# Patient Record
Sex: Female | Born: 1940 | Race: White | Hispanic: No | Marital: Married | Smoking: Former smoker
Health system: Southern US, Community
[De-identification: ages and names within clinical notes are randomized; demographics above are authoritative.]

## PROBLEM LIST (undated history)

## (undated) DIAGNOSIS — I499 Cardiac arrhythmia, unspecified: Secondary | ICD-10-CM

## (undated) DIAGNOSIS — I422 Other hypertrophic cardiomyopathy: Secondary | ICD-10-CM

## (undated) DIAGNOSIS — Z7901 Long term (current) use of anticoagulants: Secondary | ICD-10-CM

## (undated) DIAGNOSIS — N809 Endometriosis, unspecified: Secondary | ICD-10-CM

## (undated) DIAGNOSIS — K589 Irritable bowel syndrome without diarrhea: Secondary | ICD-10-CM

## (undated) DIAGNOSIS — S2239XA Fracture of one rib, unspecified side, initial encounter for closed fracture: Secondary | ICD-10-CM

## (undated) DIAGNOSIS — H01009 Unspecified blepharitis unspecified eye, unspecified eyelid: Secondary | ICD-10-CM

## (undated) DIAGNOSIS — Z9889 Other specified postprocedural states: Secondary | ICD-10-CM

## (undated) DIAGNOSIS — R519 Headache, unspecified: Secondary | ICD-10-CM

## (undated) DIAGNOSIS — I340 Nonrheumatic mitral (valve) insufficiency: Secondary | ICD-10-CM

## (undated) DIAGNOSIS — B009 Herpesviral infection, unspecified: Secondary | ICD-10-CM

## (undated) DIAGNOSIS — R51 Headache: Secondary | ICD-10-CM

## (undated) DIAGNOSIS — Z950 Presence of cardiac pacemaker: Secondary | ICD-10-CM

## (undated) DIAGNOSIS — I4891 Unspecified atrial fibrillation: Secondary | ICD-10-CM

## (undated) DIAGNOSIS — I517 Cardiomegaly: Secondary | ICD-10-CM

## (undated) HISTORY — DX: Headache: R51

## (undated) HISTORY — DX: Other specified postprocedural states: Z98.890

## (undated) HISTORY — DX: Fracture of one rib, unspecified side, initial encounter for closed fracture: S22.39XA

## (undated) HISTORY — DX: Other hypertrophic cardiomyopathy: I42.2

## (undated) HISTORY — DX: Cardiomegaly: I51.7

## (undated) HISTORY — PX: MITRAL VALVE REPAIR: SHX2039

## (undated) HISTORY — DX: Headache, unspecified: R51.9

## (undated) HISTORY — DX: Cardiac arrhythmia, unspecified: I49.9

## (undated) HISTORY — DX: Herpesviral infection, unspecified: B00.9

## (undated) HISTORY — DX: Long term (current) use of anticoagulants: Z79.01

## (undated) HISTORY — DX: Irritable bowel syndrome without diarrhea: K58.9

## (undated) HISTORY — DX: Nonrheumatic mitral (valve) insufficiency: I34.0

## (undated) HISTORY — DX: Endometriosis, unspecified: N80.9

## (undated) HISTORY — DX: Presence of cardiac pacemaker: Z95.0

## (undated) HISTORY — DX: Unspecified blepharitis unspecified eye, unspecified eyelid: H01.009

## (undated) HISTORY — DX: Unspecified atrial fibrillation: I48.91

## (undated) HISTORY — PX: MYOMECTOMY: SHX85

---
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
## 1981-09-15 HISTORY — PX: TUBAL LIGATION: SHX77

## 1999-10-17 ENCOUNTER — Other Ambulatory Visit: Admission: RE | Admit: 1999-10-17 | Discharge: 1999-10-17 | Payer: Self-pay | Admitting: Obstetrics and Gynecology

## 2000-11-24 ENCOUNTER — Other Ambulatory Visit: Admission: RE | Admit: 2000-11-24 | Discharge: 2000-11-24 | Payer: Self-pay | Admitting: Obstetrics and Gynecology

## 2002-04-21 ENCOUNTER — Encounter: Payer: Self-pay | Admitting: Internal Medicine

## 2002-04-21 ENCOUNTER — Encounter: Admission: RE | Admit: 2002-04-21 | Discharge: 2002-04-21 | Payer: Self-pay | Admitting: Internal Medicine

## 2002-06-15 HISTORY — PX: HYSTEROSCOPY: SHX211

## 2002-06-28 ENCOUNTER — Ambulatory Visit (HOSPITAL_COMMUNITY): Admission: RE | Admit: 2002-06-28 | Discharge: 2002-06-28 | Payer: Self-pay | Admitting: Obstetrics and Gynecology

## 2002-06-28 ENCOUNTER — Encounter (INDEPENDENT_AMBULATORY_CARE_PROVIDER_SITE_OTHER): Payer: Self-pay | Admitting: Specialist

## 2002-10-07 ENCOUNTER — Other Ambulatory Visit: Admission: RE | Admit: 2002-10-07 | Discharge: 2002-10-07 | Payer: Self-pay | Admitting: *Deleted

## 2002-12-15 DIAGNOSIS — K589 Irritable bowel syndrome without diarrhea: Secondary | ICD-10-CM

## 2002-12-15 HISTORY — DX: Irritable bowel syndrome, unspecified: K58.9

## 2003-01-31 ENCOUNTER — Ambulatory Visit (HOSPITAL_COMMUNITY): Admission: RE | Admit: 2003-01-31 | Discharge: 2003-01-31 | Payer: Self-pay | Admitting: Gastroenterology

## 2003-01-31 ENCOUNTER — Encounter (INDEPENDENT_AMBULATORY_CARE_PROVIDER_SITE_OTHER): Payer: Self-pay | Admitting: *Deleted

## 2003-04-18 ENCOUNTER — Encounter: Payer: Self-pay | Admitting: Gastroenterology

## 2003-04-18 ENCOUNTER — Encounter: Admission: RE | Admit: 2003-04-18 | Discharge: 2003-04-18 | Payer: Self-pay | Admitting: Gastroenterology

## 2003-10-12 ENCOUNTER — Other Ambulatory Visit: Admission: RE | Admit: 2003-10-12 | Discharge: 2003-10-12 | Payer: Self-pay | Admitting: *Deleted

## 2004-10-24 ENCOUNTER — Other Ambulatory Visit: Admission: RE | Admit: 2004-10-24 | Discharge: 2004-10-24 | Payer: Self-pay | Admitting: *Deleted

## 2005-07-31 ENCOUNTER — Encounter: Admission: RE | Admit: 2005-07-31 | Discharge: 2005-07-31 | Payer: Self-pay | Admitting: Internal Medicine

## 2005-11-12 ENCOUNTER — Other Ambulatory Visit: Admission: RE | Admit: 2005-11-12 | Discharge: 2005-11-12 | Payer: Self-pay | Admitting: Obstetrics and Gynecology

## 2007-01-22 ENCOUNTER — Other Ambulatory Visit: Admission: RE | Admit: 2007-01-22 | Discharge: 2007-01-22 | Payer: Self-pay | Admitting: Obstetrics and Gynecology

## 2007-03-08 ENCOUNTER — Ambulatory Visit: Payer: Self-pay | Admitting: Cardiology

## 2007-04-05 ENCOUNTER — Encounter: Payer: Self-pay | Admitting: Cardiology

## 2007-04-05 ENCOUNTER — Ambulatory Visit: Payer: Self-pay | Admitting: Cardiology

## 2007-04-05 ENCOUNTER — Ambulatory Visit: Payer: Self-pay

## 2007-04-05 LAB — CONVERTED CEMR LAB
AST: 28 units/L (ref 0–37)
Albumin: 4.3 g/dL (ref 3.5–5.2)
Alkaline Phosphatase: 52 units/L (ref 39–117)
Direct LDL: 135.1 mg/dL
HDL: 71 mg/dL (ref >39.0)
VLDL: 26 mg/dL (ref 0–40)

## 2007-04-07 ENCOUNTER — Ambulatory Visit: Payer: Self-pay | Admitting: Cardiology

## 2007-04-09 ENCOUNTER — Ambulatory Visit: Payer: Self-pay

## 2007-05-01 ENCOUNTER — Encounter: Admission: RE | Admit: 2007-05-01 | Discharge: 2007-05-01 | Payer: Self-pay | Admitting: Gastroenterology

## 2007-05-14 ENCOUNTER — Ambulatory Visit: Payer: Self-pay | Admitting: Cardiology

## 2007-08-10 ENCOUNTER — Ambulatory Visit: Payer: Self-pay | Admitting: Cardiology

## 2007-08-10 LAB — CONVERTED CEMR LAB
Total CHOL/HDL Ratio: 2.7
Triglycerides: 121 mg/dL (ref 0–149)

## 2007-08-19 ENCOUNTER — Ambulatory Visit: Payer: Self-pay | Admitting: Cardiology

## 2007-12-14 ENCOUNTER — Encounter: Payer: Self-pay | Admitting: Cardiology

## 2007-12-14 ENCOUNTER — Ambulatory Visit: Payer: Self-pay

## 2007-12-28 ENCOUNTER — Ambulatory Visit: Payer: Self-pay | Admitting: Cardiology

## 2008-02-01 ENCOUNTER — Other Ambulatory Visit: Admission: RE | Admit: 2008-02-01 | Discharge: 2008-02-01 | Payer: Self-pay | Admitting: Obstetrics and Gynecology

## 2008-02-02 ENCOUNTER — Encounter: Payer: Self-pay | Admitting: Cardiovascular Disease

## 2008-02-02 ENCOUNTER — Ambulatory Visit: Payer: Self-pay | Admitting: Cardiovascular Disease

## 2008-02-02 ENCOUNTER — Ambulatory Visit (HOSPITAL_COMMUNITY): Admission: RE | Admit: 2008-02-02 | Discharge: 2008-02-02 | Payer: Self-pay | Admitting: Cardiovascular Disease

## 2008-02-03 ENCOUNTER — Ambulatory Visit: Payer: Self-pay | Admitting: Cardiology

## 2008-02-09 ENCOUNTER — Ambulatory Visit: Payer: Self-pay | Admitting: Thoracic Surgery (Cardiothoracic Vascular Surgery)

## 2008-02-14 DIAGNOSIS — Z950 Presence of cardiac pacemaker: Secondary | ICD-10-CM

## 2008-02-14 HISTORY — DX: Presence of cardiac pacemaker: Z95.0

## 2008-02-14 HISTORY — PX: PACEMAKER INSERTION: SHX728

## 2008-02-15 ENCOUNTER — Ambulatory Visit: Payer: Self-pay | Admitting: Cardiology

## 2008-02-15 LAB — CONVERTED CEMR LAB
Basophils Absolute: 0.1 10*3/uL (ref 0.0–0.1)
Calcium: 9.6 mg/dL (ref 8.4–10.5)
GFR calc Af Amer: 81 mL/min
GFR calc non Af Amer: 67 mL/min
HCT: 40.8 % (ref 36.0–46.0)
Hemoglobin: 13.9 g/dL (ref 12.0–15.0)
Lymphocytes Relative: 22.6 % (ref 12.0–46.0)
MCHC: 33.9 g/dL (ref 30.0–36.0)
Monocytes Absolute: 0.5 10*3/uL (ref 0.1–1.0)
Neutro Abs: 5.6 10*3/uL (ref 1.4–7.7)
Prothrombin Time: 12.7 s (ref 10.9–13.3)
RDW: 12.5 % (ref 11.5–14.6)
Sodium: 141 meq/L (ref 135–145)
aPTT: 31.9 s — ABNORMAL HIGH (ref 21.7–29.8)

## 2008-02-17 ENCOUNTER — Inpatient Hospital Stay (HOSPITAL_BASED_OUTPATIENT_CLINIC_OR_DEPARTMENT_OTHER): Admission: RE | Admit: 2008-02-17 | Discharge: 2008-02-17 | Payer: Self-pay | Admitting: Cardiology

## 2008-02-17 ENCOUNTER — Ambulatory Visit: Payer: Self-pay | Admitting: Cardiology

## 2008-02-21 ENCOUNTER — Ambulatory Visit (HOSPITAL_COMMUNITY)
Admission: RE | Admit: 2008-02-21 | Discharge: 2008-02-21 | Payer: Self-pay | Admitting: Thoracic Surgery (Cardiothoracic Vascular Surgery)

## 2008-02-21 ENCOUNTER — Ambulatory Visit: Payer: Self-pay | Admitting: Thoracic Surgery (Cardiothoracic Vascular Surgery)

## 2008-02-21 ENCOUNTER — Encounter: Payer: Self-pay | Admitting: Thoracic Surgery (Cardiothoracic Vascular Surgery)

## 2008-02-21 ENCOUNTER — Ambulatory Visit: Payer: Self-pay | Admitting: Surgery

## 2008-02-23 ENCOUNTER — Ambulatory Visit: Payer: Self-pay | Admitting: Internal Medicine

## 2008-02-23 ENCOUNTER — Inpatient Hospital Stay (HOSPITAL_COMMUNITY)
Admission: RE | Admit: 2008-02-23 | Discharge: 2008-03-03 | Payer: Self-pay | Admitting: Thoracic Surgery (Cardiothoracic Vascular Surgery)

## 2008-02-23 ENCOUNTER — Encounter: Payer: Self-pay | Admitting: Thoracic Surgery (Cardiothoracic Vascular Surgery)

## 2008-03-07 ENCOUNTER — Ambulatory Visit: Payer: Self-pay | Admitting: Cardiovascular Disease

## 2008-03-07 ENCOUNTER — Ambulatory Visit: Payer: Self-pay | Admitting: Thoracic Surgery (Cardiothoracic Vascular Surgery)

## 2008-03-10 ENCOUNTER — Ambulatory Visit: Payer: Self-pay | Admitting: Cardiology

## 2008-03-13 ENCOUNTER — Encounter
Admission: RE | Admit: 2008-03-13 | Discharge: 2008-03-13 | Payer: Self-pay | Admitting: Thoracic Surgery (Cardiothoracic Vascular Surgery)

## 2008-03-13 ENCOUNTER — Ambulatory Visit: Payer: Self-pay | Admitting: Thoracic Surgery (Cardiothoracic Vascular Surgery)

## 2008-03-14 ENCOUNTER — Ambulatory Visit: Payer: Self-pay | Admitting: Cardiology

## 2008-03-15 ENCOUNTER — Ambulatory Visit: Payer: Self-pay | Admitting: Internal Medicine

## 2008-03-16 ENCOUNTER — Encounter (HOSPITAL_COMMUNITY): Admission: RE | Admit: 2008-03-16 | Discharge: 2008-06-08 | Payer: Self-pay | Admitting: Cardiology

## 2008-03-20 ENCOUNTER — Ambulatory Visit: Payer: Self-pay

## 2008-03-20 ENCOUNTER — Ambulatory Visit: Payer: Self-pay | Admitting: Internal Medicine

## 2008-03-29 ENCOUNTER — Ambulatory Visit: Payer: Self-pay | Admitting: Cardiology

## 2008-04-12 ENCOUNTER — Ambulatory Visit: Payer: Self-pay | Admitting: Cardiology

## 2008-04-21 ENCOUNTER — Ambulatory Visit: Payer: Self-pay | Admitting: Cardiology

## 2008-05-05 ENCOUNTER — Ambulatory Visit: Payer: Self-pay | Admitting: Internal Medicine

## 2008-05-08 ENCOUNTER — Ambulatory Visit: Payer: Self-pay | Admitting: Cardiology

## 2008-06-02 ENCOUNTER — Ambulatory Visit: Payer: Self-pay | Admitting: Cardiology

## 2008-06-05 ENCOUNTER — Ambulatory Visit: Payer: Self-pay | Admitting: Thoracic Surgery (Cardiothoracic Vascular Surgery)

## 2008-06-13 ENCOUNTER — Ambulatory Visit: Payer: Self-pay | Admitting: Internal Medicine

## 2008-06-30 ENCOUNTER — Ambulatory Visit: Payer: Self-pay | Admitting: Cardiology

## 2008-07-17 ENCOUNTER — Ambulatory Visit: Payer: Self-pay | Admitting: Cardiology

## 2008-07-17 ENCOUNTER — Ambulatory Visit: Payer: Self-pay | Admitting: Cardiovascular Disease

## 2008-07-31 ENCOUNTER — Ambulatory Visit: Payer: Self-pay | Admitting: Cardiology

## 2008-08-09 ENCOUNTER — Ambulatory Visit: Payer: Self-pay | Admitting: Cardiovascular Disease

## 2008-08-25 ENCOUNTER — Ambulatory Visit: Payer: Self-pay | Admitting: Cardiovascular Disease

## 2008-09-18 ENCOUNTER — Ambulatory Visit: Payer: Self-pay | Admitting: Internal Medicine

## 2008-10-02 ENCOUNTER — Ambulatory Visit: Payer: Self-pay | Admitting: Cardiology

## 2008-10-23 ENCOUNTER — Ambulatory Visit: Payer: Self-pay | Admitting: Cardiology

## 2008-11-23 ENCOUNTER — Ambulatory Visit: Payer: Self-pay | Admitting: Cardiovascular Disease

## 2008-12-07 DIAGNOSIS — Z950 Presence of cardiac pacemaker: Secondary | ICD-10-CM

## 2008-12-07 DIAGNOSIS — I080 Rheumatic disorders of both mitral and aortic valves: Secondary | ICD-10-CM

## 2008-12-07 DIAGNOSIS — I4891 Unspecified atrial fibrillation: Secondary | ICD-10-CM

## 2008-12-07 DIAGNOSIS — I421 Obstructive hypertrophic cardiomyopathy: Secondary | ICD-10-CM | POA: Insufficient documentation

## 2008-12-08 ENCOUNTER — Encounter: Payer: Self-pay | Admitting: Cardiology

## 2008-12-08 ENCOUNTER — Ambulatory Visit: Payer: Self-pay

## 2008-12-18 ENCOUNTER — Ambulatory Visit: Payer: Self-pay

## 2008-12-18 ENCOUNTER — Encounter: Payer: Self-pay | Admitting: Cardiology

## 2008-12-18 ENCOUNTER — Ambulatory Visit: Payer: Self-pay | Admitting: Cardiology

## 2008-12-21 ENCOUNTER — Encounter: Payer: Self-pay | Admitting: Internal Medicine

## 2009-01-15 ENCOUNTER — Ambulatory Visit: Payer: Self-pay | Admitting: Cardiology

## 2009-02-08 ENCOUNTER — Telehealth: Payer: Self-pay | Admitting: Cardiology

## 2009-02-13 ENCOUNTER — Ambulatory Visit: Payer: Self-pay | Admitting: Cardiology

## 2009-02-13 LAB — CONVERTED CEMR LAB: Protime: 18

## 2009-02-14 ENCOUNTER — Encounter: Payer: Self-pay | Admitting: *Deleted

## 2009-03-05 ENCOUNTER — Ambulatory Visit: Payer: Self-pay | Admitting: Internal Medicine

## 2009-03-21 ENCOUNTER — Encounter: Payer: Self-pay | Admitting: *Deleted

## 2009-03-28 ENCOUNTER — Ambulatory Visit: Payer: Self-pay | Admitting: Internal Medicine

## 2009-04-18 ENCOUNTER — Ambulatory Visit: Payer: Self-pay | Admitting: Cardiology

## 2009-04-18 LAB — CONVERTED CEMR LAB: POC INR: 2

## 2009-05-09 ENCOUNTER — Ambulatory Visit: Payer: Self-pay | Admitting: Internal Medicine

## 2009-05-09 LAB — CONVERTED CEMR LAB: POC INR: 1.4

## 2009-05-18 ENCOUNTER — Ambulatory Visit: Payer: Self-pay | Admitting: Internal Medicine

## 2009-06-08 ENCOUNTER — Ambulatory Visit: Payer: Self-pay | Admitting: Internal Medicine

## 2009-06-08 LAB — CONVERTED CEMR LAB: POC INR: 2.2

## 2009-06-22 ENCOUNTER — Telehealth: Payer: Self-pay | Admitting: Internal Medicine

## 2009-07-09 ENCOUNTER — Ambulatory Visit: Payer: Self-pay | Admitting: Cardiovascular Disease

## 2009-07-09 LAB — CONVERTED CEMR LAB: POC INR: 2.6

## 2009-08-02 ENCOUNTER — Encounter: Payer: Self-pay | Admitting: Internal Medicine

## 2009-08-03 ENCOUNTER — Encounter: Payer: Self-pay | Admitting: Internal Medicine

## 2009-08-03 ENCOUNTER — Ambulatory Visit: Payer: Self-pay | Admitting: Cardiology

## 2009-08-24 ENCOUNTER — Ambulatory Visit: Payer: Self-pay | Admitting: Cardiology

## 2009-09-04 ENCOUNTER — Telehealth (INDEPENDENT_AMBULATORY_CARE_PROVIDER_SITE_OTHER): Payer: Self-pay | Admitting: *Deleted

## 2009-09-10 ENCOUNTER — Ambulatory Visit (HOSPITAL_COMMUNITY): Admission: RE | Admit: 2009-09-10 | Discharge: 2009-09-10 | Payer: Self-pay | Admitting: Obstetrics and Gynecology

## 2009-09-10 HISTORY — PX: HYSTEROSCOPY: SHX211

## 2009-09-19 ENCOUNTER — Ambulatory Visit: Payer: Self-pay | Admitting: Cardiovascular Disease

## 2009-09-19 LAB — CONVERTED CEMR LAB: POC INR: 2.6

## 2009-10-19 ENCOUNTER — Ambulatory Visit: Payer: Self-pay | Admitting: Internal Medicine

## 2009-11-01 ENCOUNTER — Ambulatory Visit: Payer: Self-pay | Admitting: Internal Medicine

## 2009-11-12 ENCOUNTER — Telehealth: Payer: Self-pay | Admitting: Internal Medicine

## 2009-11-16 ENCOUNTER — Ambulatory Visit: Payer: Self-pay | Admitting: Cardiology

## 2009-12-14 ENCOUNTER — Ambulatory Visit: Payer: Self-pay | Admitting: Internal Medicine

## 2009-12-14 LAB — CONVERTED CEMR LAB: POC INR: 4

## 2009-12-31 ENCOUNTER — Ambulatory Visit: Payer: Self-pay | Admitting: Cardiology

## 2009-12-31 LAB — CONVERTED CEMR LAB: POC INR: 4

## 2010-01-14 ENCOUNTER — Ambulatory Visit: Payer: Self-pay | Admitting: Cardiology

## 2010-01-31 ENCOUNTER — Ambulatory Visit: Payer: Self-pay | Admitting: Internal Medicine

## 2010-01-31 LAB — CONVERTED CEMR LAB: POC INR: 3.4

## 2010-02-04 ENCOUNTER — Ambulatory Visit: Payer: Self-pay | Admitting: Internal Medicine

## 2010-02-22 ENCOUNTER — Ambulatory Visit: Payer: Self-pay | Admitting: Cardiovascular Disease

## 2010-02-22 LAB — CONVERTED CEMR LAB: POC INR: 1.9

## 2010-03-19 ENCOUNTER — Ambulatory Visit: Payer: Self-pay | Admitting: Internal Medicine

## 2010-04-15 ENCOUNTER — Encounter (INDEPENDENT_AMBULATORY_CARE_PROVIDER_SITE_OTHER): Payer: Self-pay | Admitting: *Deleted

## 2010-04-16 ENCOUNTER — Ambulatory Visit: Payer: Self-pay | Admitting: Cardiology

## 2010-05-09 ENCOUNTER — Ambulatory Visit: Payer: Self-pay | Admitting: Internal Medicine

## 2010-05-13 ENCOUNTER — Ambulatory Visit: Payer: Self-pay | Admitting: Cardiology

## 2010-06-12 ENCOUNTER — Ambulatory Visit: Payer: Self-pay | Admitting: Internal Medicine

## 2010-06-12 LAB — CONVERTED CEMR LAB: POC INR: 2.3

## 2010-06-25 ENCOUNTER — Ambulatory Visit: Payer: Self-pay | Admitting: Internal Medicine

## 2010-07-08 ENCOUNTER — Telehealth: Payer: Self-pay | Admitting: Internal Medicine

## 2010-07-08 ENCOUNTER — Encounter: Payer: Self-pay | Admitting: Internal Medicine

## 2010-07-30 ENCOUNTER — Ambulatory Visit: Payer: Self-pay | Admitting: Internal Medicine

## 2010-08-02 LAB — CONVERTED CEMR LAB
Basophils Absolute: 0.1 10*3/uL (ref 0.0–0.1)
Basophils Relative: 0.8 % (ref 0.0–3.0)
HCT: 40.9 % (ref 36.0–46.0)
Hemoglobin: 14 g/dL (ref 12.0–15.0)
Lymphs Abs: 1.9 10*3/uL (ref 0.7–4.0)
Monocytes Relative: 5.4 % (ref 3.0–12.0)
Neutro Abs: 5 10*3/uL (ref 1.4–7.7)
RBC: 4.32 M/uL (ref 3.87–5.11)
RDW: 13.7 % (ref 11.5–14.6)

## 2010-08-15 ENCOUNTER — Ambulatory Visit: Payer: Self-pay | Admitting: Internal Medicine

## 2010-09-06 ENCOUNTER — Telehealth: Payer: Self-pay | Admitting: Internal Medicine

## 2010-09-17 ENCOUNTER — Telehealth: Payer: Self-pay | Admitting: Internal Medicine

## 2010-10-17 NOTE — Medication Information (Signed)
Summary: ROV/LB  Anticoagulant Therapy  Managed by: Cloyde Reams, RN, BSN Referring MD: Rollene Rotunda MD PCP: Elmore Guise, MD Supervising MD: Clifton James MD, Cristal Deer Indication 1: Atrial Fibrillation (ICD-427.31) Lab Used: [facility] Site: [facility] INR POC 2.6 INR RANGE 2 - 3  Dietary changes: no    Health status changes: no    Bleeding/hemorrhagic complications: yes       Details: Still having some vaginal bleeding, lessening from [facility] on 09/10/09.  Recent/future hospitalizations: no    Any changes in medication regimen? no    Recent/future dental: no  Any missed doses?: yes     Details: Off coumadin 5 days prior to [facility] on 09/10/09.    Is patient compliant with meds? yes       Allergies (verified): No Known Drug Allergies  Anticoagulation Management History:      The patient is taking warfarin and comes in today for a routine follow up visit.  Positive risk factors for bleeding include an age of 70 years or older.  The bleeding index is 'intermediate risk'.  Negative CHADS2 values include Age > 70 years old.  The start date was 02/29/2008.  Her last INR was 2.9.  Anticoagulation responsible provider: Clifton James MD, Cristal Deer.  INR POC: 2.6.  Cuvette Lot#: [id number].  Exp: 08/2011.    Anticoagulation Management Assessment/Plan:      The patient's current anticoagulation dose is Coumadin 2.5 mg tabs: use as directed per anticoagulation clinic  5mg  every day except tuesday and fridays wchic is 2.mg.  The target INR is 2.0-3.0.  The next INR is due 10/19/2009.  Anticoagulation instructions were given to patient.  Results were reviewed/authorized by Cloyde Reams, RN, BSN.  She was notified by Cloyde Reams RN.         Prior Anticoagulation Instructions: INR 2.9 CONTINUE TAKING 2 TABLETS EVERYDAY EXCEPT TAKE 1 TABLET ON TUESDAYS AND FRIDAYS.  HOLD COUMADIN STARTING 12/22 (5 DAYS BEFORE SURGERY).  WILL RECHECK INR 1 WEEK AFTER SURGERY.  Current Anticoagulation  Instructions: INR 2.6  Continue on same dosage 2 tablets daily except 1 tablet on Tuesdays and Fridays.   Recheck in 4 weeks.

## 2010-10-17 NOTE — Medication Information (Signed)
Summary: rov/tm  Anticoagulant Therapy  Managed by: Minerva Areola, RN Referring MD: Rollene Rotunda MD PCP: Elmore Guise, MD Supervising MD: Juanda Chance MD, Lilith Solana Indication 1: Atrial Fibrillation (ICD-427.31) Lab Used: [facility] Site: [facility] INR POC 3.8 INR RANGE 2 - 3  Dietary changes: yes       Details: possible decrease in greens  Health status changes: no    Bleeding/hemorrhagic complications: no    Recent/future hospitalizations: no    Any changes in medication regimen? yes       Details: taking valcyclovir; stopped dose today  Recent/future dental: no  Any missed doses?: no       Is patient compliant with meds? yes       Current Medications (verified): 1)  Xalatan 0.005 % Soln (Latanoprost) .... Once Daily 2)  Restasis 0.05 % Emul (Cyclosporine) .... Two Times A Day Each Eye 3)  Calcium Carbonate-Vitamin D 600-400 Mg-Unit  Tabs (Calcium Carbonate-Vitamin D) .Marland Kitchen.. 1 By Mouth Two Times A Day 4)  Vagifem 25 Mcg Tabs (Estradiol) .... Twice Weekly 5)  Sleep Meds Otc .... As Needed 6)  Metoprolol Tartrate 25 Mg Tabs (Metoprolol Tartrate) .... Take One Tablet By Mouth Twice A Day 7)  Valtrex 500 Mg Tabs (Valacyclovir Hcl) .... As Needed 8)  Imodium A-D 2 Mg Tabs (Loperamide Hcl) .... Prn 9)  Coumadin 2.5 Mg Tabs (Warfarin Sodium) .... Use As Directed Per Anticoagulation Clinic  5mg  Every Day Except Tuesday and Fridays Wchic Is 2.mg 10)  Vitamin D 400 Unit Tabs (Cholecalciferol) .... Take 1 Tab Weekly 11)  Fish Oil 1000 Mg Caps (Omega-3 Fatty Acids) .... Take 1 Capsule By Mouth Two Times A Day  Allergies (verified): No Known Drug Allergies  Anticoagulation Management History:      The patient is taking warfarin and comes in today for a routine follow up visit.  Positive risk factors for bleeding include an age of 70 years or older.  The bleeding index is 'intermediate risk'.  Negative CHADS2 values include Age > 25 years old.  The start date was 02/29/2008.  Her last  INR was 2.9.  Anticoagulation responsible provider: Juanda Chance MD, Smitty Cords.  INR POC: 3.8.  Cuvette Lot#: [id number].  Exp: 01/2011.    Anticoagulation Management Assessment/Plan:      The patient's current anticoagulation dose is Coumadin 2.5 mg tabs: use as directed per anticoagulation clinic  5mg  every day except tuesday and fridays wchic is 2.mg.  The target INR is 2.0-3.0.  The next INR is due 12/14/2009.  Anticoagulation instructions were given to patient.  Results were reviewed/authorized by Minerva Areola, RN.  She was notified by Minerva Areola, RN, BSN.         Prior Anticoagulation Instructions: INR 3.4 Skip today's dose then resume 5mg s everyday except 2.5mg s on Tuesdays and Fridays. Recheck in 4 weeks.   Current Anticoagulation Instructions: The patient is to continue with the same dose of coumadin.  This dosage includes: skip today's dose, then take only one tablet tomorrow.  Then resume normal dose of 2 tablets every day except 1 tablet on Tuesdays and Fridays.

## 2010-10-17 NOTE — Progress Notes (Signed)
Summary: refil request  Phone Note Refill Request Message from:  [facility] on [facility]  Refills Requested: Medication #1:  METOPROLOL TARTRATE 25 MG TABS Take one tablet by mouth twice a day  Medication #2:  PRADAXA 150 MG CAPS one twice a day  Method Requested: Telephone to Pharmacy Initial call taken by: Glynda Jaeger,  September 06, 2010 9:12 AM    Prescriptions: METOPROLOL TARTRATE 25 MG TABS (METOPROLOL TARTRATE) Take one tablet by mouth twice a day  #180 x 2   Entered by:   Caralee Ates CMA   Authorized by:   Nathen May, MD, [facility]   Signed by:   Caralee Ates CMA on 09/06/2010   Method used:   Electronically to        [facility]* (retail)             ,          Ph: [phone]       Fax: [phone]   RxID:   [id number]

## 2010-10-17 NOTE — Cardiovascular Report (Signed)
Summary: Office Visit   Office Visit   Imported By: Roderic Ovens 06/27/2010 14:37:54  _____________________________________________________________________  External Attachment:    Type:   Image     Comment:   External Document

## 2010-10-17 NOTE — Medication Information (Signed)
Summary: rov  Anticoagulant Therapy  Managed by: Lyna Poser, PharmD Referring MD: Rollene Rotunda MD PCP: Elmore Guise, MD Supervising MD: Ladona Ridgel MD,Gregg Indication 1: Atrial Fibrillation (ICD-427.31) Lab Used: [facility] Site: [facility] INR POC 2.3 INR RANGE 2 - 3  Dietary changes: yes       Details: ate less than usual due to being out of town last week  Health status changes: yes       Details: herpes outbreak. started valacyclovir. Been taking it for 3 days.  Bleeding/hemorrhagic complications: no    Recent/future hospitalizations: no    Any changes in medication regimen? no    Recent/future dental: no  Any missed doses?: no       Is patient compliant with meds? yes       Allergies: No Known Drug Allergies  Anticoagulation Management History:      The patient is taking warfarin and comes in today for a routine follow up visit.  Positive risk factors for bleeding include an age of 70 years or older.  The bleeding index is 'intermediate risk'.  Negative CHADS2 values include Age > 70 years old.  The start date was 02/29/2008.  Her last INR was 2.9.  Anticoagulation responsible provider: Ladona Ridgel MD,Gregg.  INR POC: 2.3.  Cuvette Lot#: [id number].  Exp: 07/2011.    Anticoagulation Management Assessment/Plan:      The patient's current anticoagulation dose is Coumadin 2.5 mg tabs: Take as directed by coumadin clinic..  The target INR is 2.0-3.0.  The next INR is due 07/08/2010.  Anticoagulation instructions were given to patient.  Results were reviewed/authorized by Lyna Poser, PharmD.         Prior Anticoagulation Instructions: INR: 2.1  Please continue Coumadin 1 tablet (2.5 mg) on Sundays, Tuesdays, Thursdays and 2 tablets the other days of the week.  Please come back to see [place] in 4 weeks for an INR check.   Current Anticoagulation Instructions: INR 2.3 Continue taking 1 tablet on sunday, tuesday, and thursday. And 2 tablets all other days. Recheck in 4 weeks.

## 2010-10-17 NOTE — Medication Information (Signed)
Summary: [name]  Anticoagulant Therapy  Managed by: Bethena Midget, RN, BSN Referring MD: Rollene Rotunda MD PCP: Elmore Guise, MD Supervising MD: Juanda Chance MD, Taura Lamarre Indication 1: Atrial Fibrillation (ICD-427.31) Lab Used: [facility] Site: [facility] INR POC 3.5 INR RANGE 2 - 3  Dietary changes: no    Health status changes: no    Bleeding/hemorrhagic complications: no    Recent/future hospitalizations: no    Any changes in medication regimen? yes       Details: Has used some Valtrex PRN for 5 doses.   Recent/future dental: no  Any missed doses?: no       Is patient compliant with meds? yes       Allergies: No Known Drug Allergies  Anticoagulation Management History:      The patient is taking warfarin and comes in today for a routine follow up visit.  Positive risk factors for bleeding include an age of 77 years or older.  The bleeding index is 'intermediate risk'.  Negative CHADS2 values include Age > 65 years old.  The start date was 02/29/2008.  Her last INR was 2.9.  Anticoagulation responsible provider: Juanda Chance MD, Smitty Cords.  INR POC: 3.5.  Cuvette Lot#: [id number].  Exp: 02/2011.    Anticoagulation Management Assessment/Plan:      The patient's current anticoagulation dose is Coumadin 2.5 mg tabs: use as directed per anticoagulation clinic  5mg  every day except tuesday and fridays wchic is 2.mg.  The target INR is 2.0-3.0.  The next INR is due 01/31/2010.  Anticoagulation instructions were given to patient.  Results were reviewed/authorized by Bethena Midget, RN, BSN.  She was notified by Bethena Midget, RN, BSN.         Prior Anticoagulation Instructions: INR 4.0  Skip today's dosage of coumadin then start taking 2 tablets daily except 1 tablet on Sundays, Tuesdays, and Fridays.  Recheck in 2 weeks.    Current Anticoagulation Instructions: INR 3.5 Skip todays dose then resume 5mg s daily except 2.5mg s on Tuesdays, Fridays, and Sundays. Recheck in 2 weeks.

## 2010-10-17 NOTE — Medication Information (Signed)
Summary: rov/tm  Anticoagulant Therapy  Managed by: Charolotte Eke, PharmD Referring MD: Rollene Rotunda MD PCP: Elmore Guise, MD Supervising MD: Ladona Ridgel MD, Sharlot Gowda Indication 1: Atrial Fibrillation (ICD-427.31) Lab Used: [facility] Site: [facility] INR POC 3.4 INR RANGE 2 - 3  Dietary changes: no    Health status changes: no    Bleeding/hemorrhagic complications: no    Recent/future hospitalizations: no    Any changes in medication regimen? no    Recent/future dental: no  Any missed doses?: no       Is patient compliant with meds? yes       Current Medications (verified): 1)  Xalatan 0.005 % Soln (Latanoprost) .... Once Daily 2)  Restasis 0.05 % Emul (Cyclosporine) .... Two Times A Day Each Eye 3)  Calcium Carbonate-Vitamin D 600-400 Mg-Unit  Tabs (Calcium Carbonate-Vitamin D) .Marland Kitchen.. 1 By Mouth Two Times A Day 4)  Vagifem 25 Mcg Tabs (Estradiol) .... Twice Weekly 5)  Sleep Meds Otc .... As Needed 6)  Metoprolol Tartrate 25 Mg Tabs (Metoprolol Tartrate) .... Take One Tablet By Mouth Twice A Day 7)  Valtrex 500 Mg Tabs (Valacyclovir Hcl) .... As Needed 8)  Imodium A-D 2 Mg Tabs (Loperamide Hcl) .... Prn 9)  Coumadin 2.5 Mg Tabs (Warfarin Sodium) .... Use As Directed Per Anticoagulation Clinic  5mg  Every Day Except Tuesday and Fridays Wchic Is 2.mg 10)  Vitamin D 400 Unit Tabs (Cholecalciferol) .... Take 1 Tab Weekly 11)  Fish Oil 1000 Mg Caps (Omega-3 Fatty Acids) .... Take 1 Capsule By Mouth Two Times A Day  Allergies (verified): No Known Drug Allergies  Anticoagulation Management History:      The patient is taking warfarin and comes in today for a routine follow up visit.  Positive risk factors for bleeding include an age of 60 years or older.  The bleeding index is 'intermediate risk'.  Negative CHADS2 values include Age > 76 years old.  The start date was 02/29/2008.  Her last INR was 2.9.  Anticoagulation responsible provider: Ladona Ridgel MD, Sharlot Gowda.  INR POC: 3.4.  Cuvette  Lot#: [id number].  Exp: 04/16/2011.    Anticoagulation Management Assessment/Plan:      The patient's current anticoagulation dose is Coumadin 2.5 mg tabs: use as directed per anticoagulation clinic  5mg  every day except tuesday and fridays wchic is 2.mg.  The target INR is 2.0-3.0.  The next INR is due 02/21/2010.  Anticoagulation instructions were given to patient.  Results were reviewed/authorized by Charolotte Eke, PharmD.  She was notified by Charolotte Eke, PharmD.         Prior Anticoagulation Instructions: INR 3.5 Skip todays dose then resume 5mg s daily except 2.5mg s on Tuesdays, Fridays, and Sundays. Recheck in 2 weeks.   Current Anticoagulation Instructions: Skip today then take 2.5mg  daily except 5mg  on Mon, Wed, Fri.The patient is to hold four doses of coumadin.  The dosage to be resumed includes:

## 2010-10-17 NOTE — Cardiovascular Report (Signed)
Summary: TTM   TTM   Imported By: Roderic Ovens 11/15/2009 16:13:19  _____________________________________________________________________  External Attachment:    Type:   Image     Comment:   External Document

## 2010-10-17 NOTE — Medication Information (Signed)
Summary: rov/tm  Anticoagulant Therapy  Managed by: Cloyde Reams, RN, BSN Referring MD: Rollene Rotunda MD PCP: Elmore Guise, MD Supervising MD: Juanda Chance MD, Aniqa Hare Indication 1: Atrial Fibrillation (ICD-427.31) Lab Used: [facility] Site: [facility] INR POC 4.0 INR RANGE 2 - 3  Dietary changes: no    Health status changes: no    Bleeding/hemorrhagic complications: no    Recent/future hospitalizations: no    Any changes in medication regimen? yes       Details: Taking Meclazine prn vertigo.  Recent/future dental: no  Any missed doses?: no       Is patient compliant with meds? yes       Allergies (verified): No Known Drug Allergies  Anticoagulation Management History:      The patient is taking warfarin and comes in today for a routine follow up visit.  Positive risk factors for bleeding include an age of 16 years or older.  The bleeding index is 'intermediate risk'.  Negative CHADS2 values include Age > 44 years old.  The start date was 02/29/2008.  Her last INR was 2.9.  Anticoagulation responsible provider: Juanda Chance MD, Smitty Cords.  INR POC: 4.0.  Cuvette Lot#: [id number].  Exp: 01/2011.    Anticoagulation Management Assessment/Plan:      The patient's current anticoagulation dose is Coumadin 2.5 mg tabs: use as directed per anticoagulation clinic  5mg  every day except tuesday and fridays wchic is 2.mg.  The target INR is 2.0-3.0.  The next INR is due 01/14/2010.  Anticoagulation instructions were given to patient.  Results were reviewed/authorized by Cloyde Reams, RN, BSN.  She was notified by Cloyde Reams RN.         Prior Anticoagulation Instructions: INR 4.0 Skip today's dose then resume 5mg s daily except 2.5mg s on Tuesdays and Fridays. Recheck in 2 weeks.   Current Anticoagulation Instructions: INR 4.0  Skip today's dosage of coumadin then start taking 2 tablets daily except 1 tablet on Sundays, Tuesdays, and Fridays.  Recheck in 2 weeks.

## 2010-10-17 NOTE — Medication Information (Signed)
Summary: cvrr rov    js  Anticoagulant Therapy  Managed by: Bethena Midget, RN, BSN Referring MD: Rollene Rotunda MD PCP: Elmore Guise, MD Supervising MD: Tenny Craw MD, Gunnar Fusi Indication 1: Atrial Fibrillation (ICD-427.31) Lab Used: [facility] Site: [facility] INR POC 4.0 INR RANGE 2 - 3  Dietary changes: yes       Details: Ate less green vegetables over past week.  Health status changes: no    Bleeding/hemorrhagic complications: no    Recent/future hospitalizations: no    Any changes in medication regimen? yes       Details: Took one tablet of Valtrex on yesterday.   Recent/future dental: no  Any missed doses?: no       Is patient compliant with meds? yes       Allergies: No Known Drug Allergies  Anticoagulation Management History:      The patient is taking warfarin and comes in today for a routine follow up visit.  Positive risk factors for bleeding include an age of 45 years or older.  The bleeding index is 'intermediate risk'.  Negative CHADS2 values include Age > 48 years old.  The start date was 02/29/2008.  Her last INR was 2.9.  Anticoagulation responsible provider: Tenny Craw MD, Gunnar Fusi.  INR POC: 4.0.  Cuvette Lot#: [id number].  Exp: 01/2011.    Anticoagulation Management Assessment/Plan:      The patient's current anticoagulation dose is Coumadin 2.5 mg tabs: use as directed per anticoagulation clinic  5mg  every day except tuesday and fridays wchic is 2.mg.  The target INR is 2.0-3.0.  The next INR is due 12/28/2009.  Anticoagulation instructions were given to patient.  Results were reviewed/authorized by Bethena Midget, RN, BSN.  She was notified by Bethena Midget, RN, BSN.         Prior Anticoagulation Instructions: The patient is to continue with the same dose of coumadin.  This dosage includes: skip today's dose, then take only one tablet tomorrow.  Then resume normal dose of 2 tablets every day except 1 tablet on Tuesdays and Fridays.  Current Anticoagulation Instructions: INR  4.0 Skip today's dose then resume 5mg s daily except 2.5mg s on Tuesdays and Fridays. Recheck in 2 weeks.

## 2010-10-17 NOTE — Medication Information (Signed)
Summary: rov/ewj  Anticoagulant Therapy  Managed by: Reina Fuse, PharmD Referring MD: Rollene Rotunda MD PCP: Elmore Guise, MD Supervising MD: Juanda Chance MD, Bruce Indication 1: Atrial Fibrillation (ICD-427.31) Lab Used: [facility] Site: [facility] INR POC 2.2 INR RANGE 2 - 3  Dietary changes: no    Health status changes: no    Bleeding/hemorrhagic complications: no    Recent/future hospitalizations: no    Any changes in medication regimen? no    Recent/future dental: no  Any missed doses?: no       Is patient compliant with meds? yes       Current Medications (verified): 1)  Xalatan 0.005 % Soln (Latanoprost) .... Once Daily 2)  Restasis 0.05 % Emul (Cyclosporine) .... Two Times A Day Each Eye 3)  Calcium Carbonate-Vitamin D 600-400 Mg-Unit  Tabs (Calcium Carbonate-Vitamin D) .Marland Kitchen.. 1 By Mouth Two Times A Day 4)  Vagifem 25 Mcg Tabs (Estradiol) .... Twice Weekly 5)  Sleep Meds Otc .... As Needed 6)  Metoprolol Tartrate 25 Mg Tabs (Metoprolol Tartrate) .... Take One Tablet By Mouth Twice A Day 7)  Valtrex 500 Mg Tabs (Valacyclovir Hcl) .... As Needed 8)  Imodium A-D 2 Mg Tabs (Loperamide Hcl) .... Prn 9)  Coumadin 2.5 Mg Tabs (Warfarin Sodium) .... Use As Directed Per Anticoagulation Clinic  5mg  Every Day Except Tuesday and Fridays Wchic Is 2.mg 10)  Vitamin D 400 Unit Tabs (Cholecalciferol) .... Take 1 Tab Weekly 11)  Fish Oil 1000 Mg Caps (Omega-3 Fatty Acids) .... Take 1 Capsule By Mouth Two Times A Day  Allergies (verified): No Known Drug Allergies  Anticoagulation Management History:      The patient is taking warfarin and comes in today for a routine follow up visit.  Positive risk factors for bleeding include an age of 70 years or older.  The bleeding index is 'intermediate risk'.  Negative CHADS2 values include Age > 74 years old.  The start date was 02/29/2008.  Her last INR was 2.9.  Anticoagulation responsible Keishaun Hazel: Juanda Chance MD, Smitty Cords.  INR POC: 2.2.  Cuvette Lot#:  [id number].  Exp: 06/2011.    Anticoagulation Management Assessment/Plan:      The patient's current anticoagulation dose is Coumadin 2.5 mg tabs: use as directed per anticoagulation clinic  5mg  every day except tuesday and fridays wchic is 2.mg.  The target INR is 2.0-3.0.  The next INR is due 05/13/2010.  Anticoagulation instructions were given to patient.  Results were reviewed/authorized by Reina Fuse, PharmD.  She was notified by Reina Fuse PharmD.         Prior Anticoagulation Instructions: INR 2.1  Continue on same dosage 2 tablets daily except 1 tablet on Sundays, Tuesdays, and Thursdays.  Recheck in 4 weeks.    Current Anticoagulation Instructions: INR 2.2  Continue Coumadin 2 tabs (5 mg) on Mon, Wed, Fri, Sat and Coumadin 1 tab on Sun, Tues, Thur (2.5 mg). Return to clinic in 4 weeks.

## 2010-10-17 NOTE — Progress Notes (Signed)
Summary: refill  Phone Note Refill Request Message from:  Patient on November 12, 2009 9:43 AM  Refills Requested: Medication #1:  METOPROLOL TARTRATE 25 MG TABS Take one tablet by mouth twice a day   Supply Requested: [facility] [address]   Method Requested: Fax to Local Pharmacy Initial call taken by: Migdalia Dk,  November 12, 2009 9:43 AM  Follow-up for Phone Call       Follow-up by: Judithe Modest CMA,  November 12, 2009 1:57 PM    Prescriptions: METOPROLOL TARTRATE 25 MG TABS (METOPROLOL TARTRATE) Take one tablet by mouth twice a day  #60 x 0   Entered by:   Judithe Modest CMA   Authorized by:   Nathen May, MD, [facility]   Signed by:   Judithe Modest CMA on 11/12/2009   Method used:   Electronically to        [id number] (retail)       [address]       [place], [place]  [address]       Ph: [phone] or [phone]       Fax: [phone]   RxID:   [id number]

## 2010-10-17 NOTE — Medication Information (Signed)
Summary: Coumadin Clinic  Anticoagulant Therapy  Managed by: Inactive Referring MD: Rollene Rotunda MD PCP: Elmore Guise, MD Supervising MD: Graciela Husbands MD, Viviann Spare Indication 1: Atrial Fibrillation (ICD-427.31) Lab Used: [facility] Site: [facility] INR RANGE 2 - 3          Comments: Pt now on Pradaxa  Allergies: No Known Drug Allergies  Anticoagulation Management History:      Positive risk factors for bleeding include an age of 70 years or older.  The bleeding index is 'intermediate risk'.  Negative CHADS2 values include Age > 70 years old.  The start date was 02/29/2008.  Her last INR was 2.9.  Anticoagulation responsible Tyniah Kastens: Graciela Husbands MD, Viviann Spare.  Exp: 07/2011.    Anticoagulation Management Assessment/Plan:      The patient's current anticoagulation dose is Coumadin 2.5 mg tabs: Take as directed by coumadin clinic..  The target INR is 2.0-3.0.  The next INR is due 07/08/2010.  Anticoagulation instructions were given to patient.  Results were reviewed/authorized by Inactive.         Prior Anticoagulation Instructions: INR 2.3 Continue taking 1 tablet on sunday, tuesday, and thursday. And 2 tablets all other days. Recheck in 4 weeks.

## 2010-10-17 NOTE — Letter (Signed)
Summary: Device-Delinquent Check  [facility], Main Office  [address]   [place], [place] [address]   Phone: [phone]  Fax: [phone]     April 15, 2010 MRN: [id number]   [facility] [address] [place], [place]  [address]   Dear Ms. [name],  According to our records, you have not had your implanted device checked in the recommended period of time.  We are unable to determine appropriate device function without checking your device on a regular basis.  Please call our office to schedule an appointment as soon as possible.  If you are having your device checked by another physician, please call us so that we may update our records.  Thank you,   [facility]

## 2010-10-17 NOTE — Medication Information (Signed)
Summary: rov/ewj  Anticoagulant Therapy  Managed by: Bethena Midget, RN, BSN Referring MD: Rollene Rotunda MD PCP: Elmore Guise, MD Supervising MD: Johney Frame MD, Fayrene Fearing Indication 1: Atrial Fibrillation (ICD-427.31) Lab Used: [facility] Site: [facility] INR POC 3.4 INR RANGE 2 - 3  Dietary changes: no    Health status changes: no    Bleeding/hemorrhagic complications: no    Recent/future hospitalizations: no    Any changes in medication regimen? yes       Details: Took Acyclovir BID for 4-5days.   Recent/future dental: no  Any missed doses?: no       Is patient compliant with meds? yes       Allergies: No Known Drug Allergies  Anticoagulation Management History:      The patient is taking warfarin and comes in today for a routine follow up visit.  Positive risk factors for bleeding include an age of 70 years or older.  The bleeding index is 'intermediate risk'.  Negative CHADS2 values include Age > 71 years old.  The start date was 02/29/2008.  Her last INR was 2.9.  Anticoagulation responsible provider: Gerren Hoffmeier MD, Fayrene Fearing.  INR POC: 3.4.  Cuvette Lot#: [id number].  Exp: 12/2010.    Anticoagulation Management Assessment/Plan:      The patient's current anticoagulation dose is Coumadin 2.5 mg tabs: use as directed per anticoagulation clinic  5mg  every day except tuesday and fridays wchic is 2.mg.  The target INR is 2.0-3.0.  The next INR is due 11/16/2009.  Anticoagulation instructions were given to patient.  Results were reviewed/authorized by Bethena Midget, RN, BSN.  She was notified by Bethena Midget, RN, BSN.         Prior Anticoagulation Instructions: INR 2.6  Continue on same dosage 2 tablets daily except 1 tablet on Tuesdays and Fridays.   Recheck in 4 weeks.    Current Anticoagulation Instructions: INR 3.4 Skip today's dose then resume 5mg s everyday except 2.5mg s on Tuesdays and Fridays. Recheck in 4 weeks.

## 2010-10-17 NOTE — Cardiovascular Report (Signed)
Summary: TTM   TTM   Imported By: Roderic Ovens 08/30/2010 15:10:45  _____________________________________________________________________  External Attachment:    Type:   Image     Comment:   External Document

## 2010-10-17 NOTE — Medication Information (Signed)
Summary: rov-tp  Anticoagulant Therapy  Managed by: Bethena Midget, RN, BSN Referring MD: Rollene Rotunda MD PCP: Elmore Guise, MD Supervising MD: Clifton James MD, Cristal Deer Indication 1: Atrial Fibrillation (ICD-427.31) Lab Used: [facility] Site: [facility] INR POC 1.9 INR RANGE 2 - 3  Dietary changes: yes       Details: Possible ate more leafy green veggies  Health status changes: no    Bleeding/hemorrhagic complications: no    Recent/future hospitalizations: no    Any changes in medication regimen? no    Recent/future dental: no  Any missed doses?: no       Is patient compliant with meds? yes       Allergies: No Known Drug Allergies  Anticoagulation Management History:      The patient is taking warfarin and comes in today for a routine follow up visit.  Positive risk factors for bleeding include an age of 47 years or older.  The bleeding index is 'intermediate risk'.  Negative CHADS2 values include Age > 63 years old.  The start date was 02/29/2008.  Her last INR was 2.9.  Anticoagulation responsible provider: Clifton James MD, Cristal Deer.  INR POC: 1.9.  Cuvette Lot#: [id number].  Exp: 04/16/2011.    Anticoagulation Management Assessment/Plan:      The patient's current anticoagulation dose is Coumadin 2.5 mg tabs: use as directed per anticoagulation clinic  5mg  every day except tuesday and fridays wchic is 2.mg.  The target INR is 2.0-3.0.  The next INR is due 03/15/2010.  Anticoagulation instructions were given to patient.  Results were reviewed/authorized by Bethena Midget, RN, BSN.  She was notified by Bethena Midget, RN, BSN.         Prior Anticoagulation Instructions: Skip today then take 2.5mg  daily except 5mg  on Mon, Wed, Fri.The patient is to hold four doses of coumadin.  The dosage to be resumed includes:   Current Anticoagulation Instructions: INR 1.9 Start taking 5mg s on Mondays, Wednesdays, Fridays and Saturdays, 2.5mg s on Tuesdays, Thursdays and Sundays. Recheck in 3  weeks.

## 2010-10-17 NOTE — Cardiovascular Report (Signed)
Summary: TTM   TTM   Imported By: Roderic Ovens 03/01/2010 09:21:22  _____________________________________________________________________  External Attachment:    Type:   Image     Comment:   External Document

## 2010-10-17 NOTE — Assessment & Plan Note (Signed)
Summary: pc2/ gd   Primary Provider:  Elmore Guise, MD  CC:  no complaints.  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 She has paroxysmal atrial fibrillation and is status post implanted pacemaker.  she has underlying hypertrophic myopathy with asymmetric septal hypertrophy  She denies positive exercise intolerance apart from climbing steep hills. She does aerobics 3 times a week and is without limitation in this regard.  Overall she feels terrific.  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 Once Daily 2)  Restasis 0.05 % Emul (Cyclosporine) .... Two Times A Day Each Eye 3)  Calcium Carbonate-Vitamin D 600-400 Mg-Unit  Tabs (Calcium Carbonate-Vitamin D) .Marland Kitchen.. 1 By Mouth Two Times A Day 4)  Vagifem 25 Mcg Tabs (Estradiol) .... Twice Weekly 5)  Sleep Meds Otc .... As Needed 6)  Metoprolol Tartrate 25 Mg Tabs (Metoprolol Tartrate) .... Take One Tablet By Mouth Twice A Day 7)  Valtrex 500 Mg Tabs (Valacyclovir Hcl) .... As Needed 8)  Imodium A-D 2 Mg Tabs (Loperamide Hcl) .... Prn 9)  Coumadin 2.5 Mg Tabs (Warfarin Sodium) .... Take As Directed By Coumadin Clinic. 10)  Vitamin D 400 Unit Tabs (Cholecalciferol) .... Take 1 Tab Weekly 11)  Fish Oil 1000 Mg Caps (Omega-3 Fatty Acids) .... 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 (02/23/08  Dr. Cornelius Moras) Appendectomy Tubal ligation  Family History: Last updated: 12/07/2008 Is remarkable for brother in his 30s with triple bypass   and another brother with 2 vessel coronary disease at age 51.  Her   father died at a later age with a myocardial infarction.   Social History: Last updated: 12/07/2008 The patient is married and lives here in [place]   with her husband.  They have 1 grown child.  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  Her follow is Dr. Otho Perl.  She is a nonsmoker, although she did smoke briefly during her   youth and quit smoking during her 46s.  She denies excessive alcohol   consumption.   Vital Signs:  Patient profile:   70 year old female Height:      60 inches Weight:      119 pounds BMI:     23.32 Pulse rate:   59 / minute Resp:     18 per minute BP sitting:   99 / 57  (left arm)  Vitals Entered By: Laurance Flatten CMA (June 25, 2010 1:19 PM)  Physical Exam  General:  The patient was alert and oriented in no acute distress. HEENT Normal.  Neck veins were flat, carotids were brisk.  Lungs were clear.  Heart sounds were regular without murmurs or gallops.  Abdomen was soft with active bowel sounds. There is no clubbing cyanosis or edema. Skin Warm and dry '   EKG  Procedure date:  06/25/2010  Findings:      sinus rhythm  PPM Specifications Following MD:  Sherryl Manges, MD     PPM Vendor:  St Jude     PPM Model Number:  [phone]     PPM Serial Number:  [id number] PPM DOI:  03/01/2008      Lead 1    Location: RA     DOI: 03/01/2008     Model #: 1688TC     Serial #: [id number]     Status: active Lead 2    Location: RV     DOI: 03/01/2008     Model #: 1688TC     Serial #: [id number]     Status: active   Indications:  HOCM   PPM Follow Up Battery Voltage:  2.79 V     Battery Est. Longevity:  9.50 yrs     Pacer Dependent:  No       PPM Device  Measurements Atrium  Amplitude: 5.0 mV, Impedance: 584 ohms, Threshold: 0.75 V at 0.4 msec Right Ventricle  Amplitude: 12.0 mV, Impedance: 308 ohms, Threshold: 0.875 V at 0.4 msec  Episodes MS Episodes:  109     Percent Mode Switch:  <1%     Coumadin:  No Ventricular High Rate:  0     Atrial Pacing:  >99%     Ventricular Pacing:  <1%  Parameters Mode:  DDDR     Lower Rate Limit:  60     Upper Rate Limit:  100 Paced AV Delay:  225     Sensed AV Delay:  225 Next Cardiology Appt Due:  12/16/2010 Tech Comments:  109 AMS EPISODES--LONGEST WAS 14 SECONDS. V RATES CONTROLLED DURING AMS.  NORMAL DEVICE FUNCTION.  NO CHANGES MADE. MEDNET TTM 3 MTHS. ROV IN 6 MTHS W/DEVICE CLINIC. Vella Kohler  June 25, 2010 1:13 PM  Impression & Recommendations:  Problem # 1:  FIBRILLATION, ATRIAL (ICD-427.31) the patient has atrial fibrillation but is holding sinus rhythm. Interrogation of her device demonstrates atrial fibrillation burden of less than 1% the longest episode which is 14 seconds.  We had a lengthy discussion regarding the role of oral anticoagulation including the risk and benefits of Pradaxa versus Coumadin. She would like to begin the former. This was prescribed Her updated medication list for this problem includes:    Metoprolol Tartrate 25 Mg Tabs (Metoprolol tartrate) .Marland Kitchen... Take one tablet by mouth twice a day    Coumadin 2.5 Mg Tabs (Warfarin sodium) .Marland Kitchen... Take as directed by coumadin clinic.  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 Take one tablet by mouth twice a day    Coumadin 2.5 Mg Tabs (Warfarin sodium) .Marland Kitchen... Take as directed by coumadin clinic.  Problem # 3:  PACEMAKER, PERMANENT (ICD-V45.01) Device parameters and data were reviewed and no changes were made  Problem # 4:  SINUS NODE DYSFUNCTION (ICD-427.81) she is 100% atrially paced.  Her heart rate excursion is somewhat limited although her peak sensory is 140. As she has no exercise limitations we will leave the programming as it is Her updated medication list for this problem includes:    Metoprolol Tartrate 25 Mg Tabs (Metoprolol tartrate) .Marland Kitchen... Take one tablet by mouth twice a day    Coumadin 2.5 Mg Tabs (Warfarin sodium) .Marland Kitchen... Take as directed by coumadin clinic.  Patient Instructions: 1)  Your physician recommends that you  schedule a follow-up appointment in: 2 to 3 months with Dr Graciela Husbands 2)  Your physician has recommended you make the following change in your medication: start Pradaxa 150 mg twice daily after 2 days 3)  Your physician recommends that you return for lab work in:  1 month CBC 427.31 v58.69 Prescriptions: PRADAXA 150 MG CAPS (DABIGATRAN ETEXILATE MESYLATE) one twice a day  #180 x 3   Entered by:   Jennifer Gladden RN   Authorized by:   Nathen May, MD, [facility]   Signed by:   Jennifer Gladden RN on 06/25/2010   Method used:   Electronically to        [facility] ([facility]       [address]       [place], [place]  [address]       Ph: [phone] or [phone]       Fax: [phone]   RxID:   [id number] PRADAXA 150 MG CAPS (DABIGATRAN ETEXILATE MESYLATE) one twice a day  #60 x 1   Entered by:   Jennifer Gladden RN   Authorized by:   Nathen May, MD, [facility]   Signed by:   Jennifer Gladden RN on 06/25/2010   Method used:   Electronically to        [facility] ([facility]ail[facility]       [address]       [place], [place]  [address]       Ph: [phone] or [phone]       Fax: [phone]   RxID:   [id number]

## 2010-10-17 NOTE — Medication Information (Signed)
Summary: rov/tp  Anticoagulant Therapy  Managed by: Geoffry Paradise, PharmD Referring MD: Rollene Rotunda MD PCP: Elmore Guise, MD Supervising MD: Riley Kill MD, Maisie Fus Indication 1: Atrial Fibrillation (ICD-427.31) Lab Used: [facility] Site: [facility] INR POC 2.1 INR RANGE 2 - 3  Dietary changes: no    Health status changes: no    Bleeding/hemorrhagic complications: no    Recent/future hospitalizations: no    Any changes in medication regimen? no    Recent/future dental: no  Any missed doses?: no       Is patient compliant with meds? yes       Allergies: No Known Drug Allergies  Anticoagulation Management History:      Positive risk factors for bleeding include an age of 4 years or older.  The bleeding index is 'intermediate risk'.  Negative CHADS2 values include Age > 85 years old.  The start date was 02/29/2008.  Her last INR was 2.9.  Anticoagulation responsible provider: Riley Kill MD, Maisie Fus.  INR POC: 2.1.  Cuvette Lot#: [id number].  Exp: 07/2011.    Anticoagulation Management Assessment/Plan:      The patient's current anticoagulation dose is Coumadin 2.5 mg tabs: Take as directed by coumadin clinic..  The target INR is 2.0-3.0.  The next INR is due 06/10/2010.  Anticoagulation instructions were given to patient.  Results were reviewed/authorized by Geoffry Paradise, PharmD.         Prior Anticoagulation Instructions: INR 2.2  Continue Coumadin 2 tabs (5 mg) on Mon, Wed, Fri, Sat and Coumadin 1 tab on Sun, Tues, Thur (2.5 mg). Return to clinic in 4 weeks.    Current Anticoagulation Instructions: INR: 2.1  Please continue Coumadin 1 tablet (2.5 mg) on Sundays, Tuesdays, Thursdays and 2 tablets the other days of the week.  Please come back to see [place] in 4 weeks for an INR check.

## 2010-10-17 NOTE — Progress Notes (Signed)
Summary: PRADAXA  Phone Note Refill Request Message from:  Patient on September 17, 2010 9:44 AM  Refills Requested: Medication #1:  PRADAXA 150 MG CAPS one twice a day [phone]  Initial call taken by: Judie Grieve,  September 17, 2010 9:46 AM    Prescriptions: PRADAXA 150 MG CAPS (DABIGATRAN ETEXILATE MESYLATE) one twice a day  #60 x 1   Entered by:   Caralee Ates CMA   Authorized by:   Nathen May, MD, [facility]   Signed by:   Caralee Ates CMA on 09/17/2010   Method used:   Electronically to        [facility]* (retail)             ,          Ph: [phone]       Fax: [phone]   RxID:   [id number]

## 2010-10-17 NOTE — Cardiovascular Report (Signed)
Summary: TTM   TTM   Imported By: Roderic Ovens 06/26/2010 16:10:41  _____________________________________________________________________  External Attachment:    Type:   Image     Comment:   External Document

## 2010-10-17 NOTE — Medication Information (Signed)
Summary: rov/tm  Anticoagulant Therapy  Managed by: Cloyde Reams, RN, BSN Referring MD: Rollene Rotunda MD PCP: Elmore Guise, MD Supervising MD: Graciela Husbands MD, Viviann Spare Indication 1: Atrial Fibrillation (ICD-427.31) Lab Used: [facility] Site: [facility] INR POC 2.1 INR RANGE 2 - 3  Dietary changes: no    Health status changes: no    Bleeding/hemorrhagic complications: no    Recent/future hospitalizations: no    Any changes in medication regimen? no    Recent/future dental: no  Any missed doses?: no       Is patient compliant with meds? yes       Allergies: No Known Drug Allergies  Anticoagulation Management History:      The patient is taking warfarin and comes in today for a routine follow up visit.  Positive risk factors for bleeding include an age of 70 years or older.  The bleeding index is 'intermediate risk'.  Negative CHADS2 values include Age > 40 years old.  The start date was 02/29/2008.  Her last INR was 2.9.  Anticoagulation responsible provider: Graciela Husbands MD, Viviann Spare.  INR POC: 2.1.  Cuvette Lot#: [id number].  Exp: 05/2011.    Anticoagulation Management Assessment/Plan:      The patient's current anticoagulation dose is Coumadin 2.5 mg tabs: use as directed per anticoagulation clinic  5mg  every day except tuesday and fridays wchic is 2.mg.  The target INR is 2.0-3.0.  The next INR is due 04/16/2010.  Anticoagulation instructions were given to patient.  Results were reviewed/authorized by Cloyde Reams, RN, BSN.  She was notified by Cloyde Reams RN.         Prior Anticoagulation Instructions: INR 1.9 Start taking 5mg s on Mondays, Wednesdays, Fridays and Saturdays, 2.5mg s on Tuesdays, Thursdays and Sundays. Recheck in 3 weeks.   Current Anticoagulation Instructions: INR 2.1  Continue on same dosage 2 tablets daily except 1 tablet on Sundays, Tuesdays, and Thursdays.  Recheck in 4 weeks.

## 2010-10-17 NOTE — Progress Notes (Signed)
  Phone Note Outgoing Call   Summary of Call: left msg for pt to f/u on how doing on pradaxa. adv to call if having any problems.

## 2010-11-04 ENCOUNTER — Encounter: Payer: Self-pay | Admitting: Internal Medicine

## 2010-11-04 ENCOUNTER — Encounter (INDEPENDENT_AMBULATORY_CARE_PROVIDER_SITE_OTHER): Payer: 59 | Admitting: Internal Medicine

## 2010-11-04 DIAGNOSIS — I4891 Unspecified atrial fibrillation: Secondary | ICD-10-CM

## 2010-11-04 DIAGNOSIS — I422 Other hypertrophic cardiomyopathy: Secondary | ICD-10-CM

## 2010-11-04 DIAGNOSIS — I080 Rheumatic disorders of both mitral and aortic valves: Secondary | ICD-10-CM

## 2010-11-04 DIAGNOSIS — I498 Other specified cardiac arrhythmias: Secondary | ICD-10-CM

## 2010-11-12 NOTE — Assessment & Plan Note (Signed)
Summary: 3 month follow [email or web address] pt call has class that day/...   Primary Provider:  Elmore Guise, MD  CC:  3 month follow up.  Pt reports no cardiac concerns today.  History of Present Illness:   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 She has paroxysmal atrial fibrillation and is status post implanted pacemaker.  she has underlying hypertrophic myopathy with asymmetric septal hypertrophy  She is doing very well and tolerated Pradaxa without side effects     Current Medications (verified): 1)  Xalatan 0.005 % Soln (Latanoprost) .... Once Daily 2)  Restasis 0.05 % Emul (Cyclosporine) .... Two Times A Day Each Eye 3)  Calcium Carbonate-Vitamin D 600-400 Mg-Unit  Tabs (Calcium Carbonate-Vitamin D) .Marland Kitchen.. 1 By Mouth Two Times A Day 4)  Vagifem 25 Mcg Tabs (Estradiol) .... Twice Weekly 5)  Sleep Meds Otc .... As Needed 6)  Metoprolol Tartrate 25 Mg Tabs (Metoprolol Tartrate) .... Take One Tablet By Mouth Twice A Day 7)  Valtrex 500 Mg Tabs (Valacyclovir Hcl) .... As Needed 8)  Vitamin D 400 Unit Tabs (Cholecalciferol) .... Take 1 Tab Weekly 9)  Fish Oil 1000 Mg Caps (Omega-3 Fatty Acids) .... Take 1 Capsule By Mouth Two Times A Day 10)  Pradaxa 150 Mg Caps (Dabigatran Etexilate Mesylate) .... 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 (02/23/08  Dr. Cornelius Moras) Appendectomy Tubal ligation  Family History: Last updated: 12/07/2008 Is remarkable for  brother in his 61s with triple bypass   and another brother with 2 vessel coronary disease at age 52.  Her   father died at a later age with a myocardial infarction.   Social History: Last updated: 12/07/2008 The patient is married and lives here in [place]   with her husband.  They have 1 grown child.  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  Her follow is Dr. Otho Perl.  She is a nonsmoker, although she did smoke briefly during her   youth and quit smoking during her 64s.  She denies excessive alcohol   consumption.   Vital Signs:  Patient profile:   70 year old female Height:      60 inches Weight:      121 pounds BMI:     23.72 Pulse rate:   60 / minute Pulse rhythm:   regular BP sitting:   124 / 60  (right arm) Cuff size:   regular  Vitals Entered By: Judithe Modest CMA (November 04, 2010 3:45 PM)  Physical Exam  General:  The patient was alert and oriented in no acute distress. HEENT Normal.  Neck veins were flat, carotids were brisk.  Lungs were clear.  Heart sounds were regular without murmurs or gallops.  Abdomen was soft with active bowel sounds. There is no clubbing cyanosis or edema. Skin Warm and dry    PPM Specifications Following MD:  Sherryl Manges, MD     PPM Vendor:  [employer] Jude     PPM Model Number:  [id number]     PPM  Serial Number:  [id number] PPM DOI:  03/01/2008      Lead 1    Location: RA     DOI: 03/01/2008     Model #: 1688TC     Serial #: [id number]     Status: active Lead 2    Location: RV     DOI: 03/01/2008     Model #: 1688TC     Serial #: [id number]     Status: active   Indications:  HOCM   PPM Follow Up Pacer Dependent:  No      Episodes Coumadin:  No  Parameters Mode:  DDDR     Lower Rate Limit:  60     Upper Rate Limit:  100 Paced AV Delay:  225     Sensed AV Delay:  225  Impression & Recommendations:  Problem # 1:  ATRIAL FIBRILLATION (ICD-427.31) paroxysmal. She tolerated Pradaxa.  The following  medications were removed from the medication list:    Coumadin 2.5 Mg Tabs (Warfarin sodium) .Marland Kitchen... Take as directed by coumadin clinic. Her updated medication list for this problem includes:    Metoprolol Tartrate 25 Mg Tabs (Metoprolol tartrate) .Marland Kitchen... 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 her son will get an echo in [place]  The following medications were removed from the medication list:    Coumadin 2.5 Mg Tabs (Warfarin sodium) .Marland Kitchen... Take as directed by coumadin clinic. Her updated medication list for this problem includes:    Metoprolol Tartrate 25 Mg Tabs (Metoprolol tartrate) .Marland Kitchen... Take one tablet by mouth twice a day  Problem # 3:  PACEMAKER, PERMANENT (ICD-V45.01) Device parameters and data were reviewed and no changes were made  Problem # 4:  MITRAL REGURGITATION (ICD-396.3) she is status post mitral valve repair. She asked regarding SBE prophylaxis. Given the fact that she has a ring and has a plicated valve I recommended she continue her prophylaxis  Patient Instructions: 1)  Your physician recommends that you continue on your current medications as directed. Please refer to the Current Medication list given to you today. 2)  Your physician wants you to follow-up in: YEAR WITH DR Logan Bores will receive a reminder letter in the mail two months in advance. If you don't receive a letter, please call our office to schedule the follow-up appointment.

## 2010-11-21 NOTE — Cardiovascular Report (Signed)
Summary: Office Visit   Office Visit   Imported By: Roderic Ovens 11/14/2010 16:13:27  _____________________________________________________________________  External Attachment:    Type:   Image     Comment:   External Document

## 2010-11-28 ENCOUNTER — Telehealth: Payer: Self-pay | Admitting: Internal Medicine

## 2010-12-03 NOTE — Progress Notes (Signed)
Summary: rx refill  Phone Note Refill Request Message from:  Patient on November 28, 2010 11:04 AM  Refills Requested: Medication #1:  PRADAXA 150 MG CAPS one twice a day. pt needs rx to hold her over until [facility] mail her rx/ [phone]. pt wants [facility]adaxa to be [facility] to [facility] for 3months supply.   Method Requested: Telephone to Pharmacy Initial call taken by: Roe Coombs,  November 28, 2010 11:05 AM    Prescriptions: PRADAXA 150 MG CAPS (DABIGATRAN ETEXILATE MESYLATE) one twice a day  #60 x 1   Entered by:   Hardin Negus, RMA   Authorized by:   Nathen May, MD, [facility]   Signed by:   Hardin Negus, RMA on 11/28/2010   Method used:   [facility] to        [facility]       [address]       [place], [place]  [address]       Ph: [phone] or [phone]       Fax: [phone]   RxID:   [id number]

## 2010-12-16 LAB — CBC
HCT: 43 % (ref 36.0–46.0)
MCHC: 33.8 g/dL (ref 30.0–36.0)
MCV: 91 fL (ref 78.0–100.0)
Platelets: 190 10*3/uL (ref 150–400)
RDW: 13.4 % (ref 11.5–15.5)

## 2010-12-16 LAB — COMPREHENSIVE METABOLIC PANEL
Albumin: 3.9 g/dL (ref 3.5–5.2)
BUN: 17 mg/dL (ref 6–23)
Calcium: 9.7 mg/dL (ref 8.4–10.5)
Creatinine, Ser: 0.8 mg/dL (ref 0.4–1.2)
Glucose, Bld: 84 mg/dL (ref 70–99)
Potassium: 4.2 mEq/L (ref 3.5–5.1)
Total Protein: 7.4 g/dL (ref 6.0–8.3)

## 2010-12-16 LAB — PROTIME-INR: Prothrombin Time: 34.1 seconds — ABNORMAL HIGH (ref 11.6–15.2)

## 2011-01-28 NOTE — Assessment & Plan Note (Signed)
[facility]                            CARDIOLOGY OFFICE NOTE   [name], [name]                       MRN:          [id number]  DATE:12/28/2007                            DOB:          1941/04/16    The primary is Dr. Elmore Guise.   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   HISTORY OF PRESENT ILLNESS:  The patient returns for follow-up after her  recent transthoracic echocardiogram.  She has hypertrophic  cardiomyopathy.  This demonstrated that the interventricular septal  thickness was unchanged, the outflow gradient ell was unchanged at a  peak of 96 mmHg.  However, her mitral regurgitation related to systolic  anterior motion of the mitral leaflets seemed to be more severe than the  echocardiogram she had in July 2008.  This suggested that it was  moderate to severe where as it had been reported as mild before.  Left  atrium was mildly dilated.  Left ventricular function was hyperdynamic.   Brought the patient back to discuss these results and the next steps in  evaluation.  She continues to say that she is having no symptoms.  She  is active.  She denies any chest discomfort, neck discomfort, activity-  induced nausea or vomiting, or excessive diaphoresis.  She is not having  any palpitations, presyncope, or syncope.  She has had no PND or  orthopnea.   PAST MEDICAL HISTORY:  1. Hypertrophic cardiomyopathy as described.  2. Glaucoma.  3. Tubal ligation.  4. Appendectomy.   ALLERGIES:  None.   MEDICATIONS:  1. Fosamax 70 mg weekly.  2. Xalatan.  3. Restasis.  4. Calcium.  5. Omega 3.  6. Multivitamin.  7. Aspirin.  8. Vagifem.  9. Garlic.  10.Align.  11.Sleep medication over-the-counter.   REVIEW OF SYSTEMS:  As stated in HPI and otherwise negative for other  systems.   PHYSICAL EXAMINATION:  The patient is in no distress.  Blood pressure  154/82, heart rate 61 and regular.  HEENT:   Eyelids are unremarkable; pupils equal, round, and reactive to  light; fundi not visualized.  Oral mucosa unremarkable.  NECK:  No jugular venous distention at 45 degrees; carotid upstroke  brisk and symmetric; no bruits, transmitted systolic murmurs; no  thyromegaly.  LYMPHATICS:  No cervical, axillary, or inguinal adenopathy.  LUNGS:  Clear to auscultation bilaterally.  BACK:  No costovertebral angle tenderness.  CHEST:  Unremarkable.  HEART:  PMI not displaced or sustained, S1-S2 within normal limits.  No  opening snap, 3/6 apical systolic murmur radiating out aortic outflow  tract, mid to late peaking, increasing with a strain phase of Valsalva,  no diastolic murmurs.  ABDOMEN:  Flat, positive bowel sounds, normal in frequency and pitch, no  bruits, no rebound, no guarding, no midline pulsatile mass, no  organomegaly.  SKIN:  No rashes, no nodules.  EXTREMITIES:  2+ pulses; no cyanosis, no clubbing, no edema.  NEUROLOGICAL:  Oriented to person, place, and time; cranial nerves II-  XII grossly intact; motor grossly intact.   EKG:  Sinus rhythm, rate 57,  axis within normal limits, left ventricle  hypertrophy by voltage criteria, poor anterior R wave progression,  nonspecific lateral ST changes.   ASSESSMENT/PLAN:  1. Hypertrophic cardiomyopathy and mitral regurgitation.  The patient      seems to have had worsening of her MR comparing the results      recently to the one in July of last year.  Given this, we need to      further quantify and understand her mitral valve with a      transesophageal echocardiogram.  She maybe nearing the need for      elective valve repair and myomectomy.  We had a long discussion in      the office about this.  She is quite anxious.  We will go ahead and      arrange the TEE.  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  For now, she will continue the      other medications as listed, as she is not having  any symptoms.  2. Hypertension.  Blood pressure is elevated today, but she is      anxious.  This is usually not a problem, and I will not prescribe      specific therapy.  3. Dyslipidemia.  The patient has desired dietary therapy, and she has      been successful with this.  No further therapy is suggested.  4. Follow-up will be after transesophageal echocardiogram which I will      arrange with Dr. Eden Emms who has read both of her transthoracic      echocardiograms.     Rollene Rotunda, MD, [facility]  Electronically Signed    JH/MedQ  DD: 12/28/2007  DT: 12/28/2007  Job #: [id number]   cc:   Erskine Speed, M.D.

## 2011-01-28 NOTE — Assessment & Plan Note (Signed)
[facility]                            CARDIOLOGY OFFICE NOTE   [name], [name]                       MRN:          [id number]  DATE:05/08/2008                            DOB:          05-26-1941    PRIMARY CARE PHYSICIAN:  Dr. Erskine Speed.   REASON FOR PRESENTATION:  The patient is status post mitral valve  repair, myomectomy, and pacemaker placement.   HISTORY OF PRESENT ILLNESS:  The patient returns for followup of the  above.  She is doing quite well since I last saw her.  She has completed  cardiac rehab and is now going to start back exercising on her own.  She  is back to teaching school.  She says she feels well.  She feels like  her old self.  She has had no chest pain, neck or arm discomfort.  She  had no palpitation, presyncope, or syncope.  She has had no shortness of  breath.  She denies any fevers or chills.  She had her pacemaker  followed up.  She just felt no tachy arrhythmias at all.   PAST MEDICAL HISTORY:  1. Severe mitral regurgitation status post valve repair (plication of      the posterior leaflet with a #28 [employer]tronic CG Future band      annuloplasty), septal myomectomy for asymmetric septal hypertrophy,      permanent pacemaker placement for bradycardia, glaucoma, tubal      ligation, appendectomy.   ALLERGIES:  None.   MEDICATIONS:  1. Fosamax.  2. __________.  3. Restasis.  4. Calcium.  5. Omega 3.  6. Multivitamin.  7. Vagifem.  8. Coumadin.  9. Metoprolol 50 mg b.i.d.   REVIEW OF SYSTEMS:  As stated in the HPI and otherwise negative for  other systems.   PHYSICAL EXAMINATION:  GENERAL:  The patient is pleasant and in no  distress.  VITAL SIGNS:  Blood pressure 120/69, heart rate 59 and regular, weight  118, body mass index 20.  HEENT:  Eyes unremarkable, pupils equal, round, and reactive to light,  fundi not visualized, oral mucosa normal.  NECK:  No jugular venous distention at 45 degrees,  carotid upstroke  brisk and symmetric, no bruits, no thyromegaly.  LYMPHATICS:  No cervical, axillary, inguinal adenopathy.  LUNGS:  Clear to auscultation bilaterally.  BACK:  No costovertebral angle tenderness.  CHEST:  Well-healed sternotomy scar and pacemaker pocket.  HEART:  PMI not displaced or sustained, S1 and S2 within normal limits,  no S3, 2/6 brief apical systolic murmur radiating slightly out the  aortic outflow tract, no rubs, no clicks, no diastolic murmurs.  ABDOMEN:  Flat, positive bowel sounds, normal in frequency and pitch, no  bruits, no rebound, no guarding or midline pulsatile mass.  No  hepatomegaly, no splenomegaly.  SKIN:  No rashes, no nodules.  EXTREMITIES:  2+ pulses throughout, no edema, cyanosis, no clubbing.  NEURO:  Grossly intact.   EKG atrial paced rhythm, heart rate 60, left bundle-branch block.   ASSESSMENT AND PLAN:  1. Mitral regurgitation status post repair.  She  is doing well in      respect to this.  She understands and agrees with prophylaxis.  No      further cardiovascular testing is suggested.  2. Hypertrophic cardiomyopathy.  She has no significant murmur.  No      further evaluation is warranted at this point.  We will follow this      with echocardiograms in the future.  3. Atrial fibrillation and sick sinus syndrome.  I am going to      continue the Coumadin for a short while longer.  She is going to      have pacemaker followup.  If I do not appreciate any symptomatic      tachy palpitations or there is any evidence of atrial high rates of      arrhythmias, on follow up discontinue the Coumadin.  I do think she      can go down her metoprolol 25 mg twice a day.  She seems to be 99%      A paced.  4. Followup.  We will see her back in 2 months or sooner if needed.     Rollene Rotunda, MD, [facility]  Electronically Signed    JH/MedQ  DD: 05/08/2008  DT: 05/09/2008  Job #: [id number]   cc:   Erskine Speed, M.D.

## 2011-01-28 NOTE — Assessment & Plan Note (Signed)
OFFICE VISIT   ERIS, HANNAN  DOB:  Jan 08, 1941                                        June 05, 2008  CHART #:  [id number]   HISTORY OF PRESENT ILLNESS:  The patient returns for routine followup  now more than 3 months status post septal myomectomy and mitral valve  repair for severe mitral regurgitation with hypertrophic obstructive  cardiomyopathy.  She has done quite well postoperatively.  Her early  postoperative course was notable for recurrent paroxysmal atrial  fibrillation and tachy-brady syndrome that ultimately required permanent  pacemaker placement.  Once this was accomplished, she has done very  nicely.  She continues to follow up with Dr. Antoine Poche at the [facility]  Cardiology Office.  She has had her Coumadin dose adjusted and monitored  and she has had no complications with long-term Coumadin therapy.  She  denies any tachy palpitations.  She has not had any dizzy spells.  She  had no residual soreness in her chest.  She has no shortness of breath  and in fact, she now admits that her exercise tolerance is better than  it was prior to surgery.  She is back getting along and quite active and  she has no problems or complaints whatsoever.  The remainder of her  review of systems is unrevealing.   CURRENT MEDICATIONS:  Coumadin, metoprolol, Fosamax, multivitamin,  Xalatan eye drop, Restasis, and fish oil capsules.  She previously has  had some problems with her eyesight, but she got her prescription  corrected by her ophthalmologist and her eyesight is perfectly normal  now.   PHYSICAL EXAMINATION:  GENERAL:  Notable for a well-appearing female.  VITAL SIGNS:  Blood pressure 122/73, pulse 67 and regular, and oxygen  saturation 94% on room air.  HEENT:  Unrevealing.  CHEST:  A median sternotomy scar has healed completely and the sternum  is stable on palpation.  Auscultation of the chest demonstrates clear  breath sounds, which are  symmetrical bilaterally.  CARDIOVASCULAR:  Notable for regular rate and rhythm.  No murmurs, rubs,  or gallops are noted.  ABDOMEN:  Soft and nontender.  EXTREMITIES:  Warm and well perfused.  There is no lower extremity  edema.  The remainder of her physical exam is unrevealing.   IMPRESSION:  The patient appears to be doing remarkably well now more  than 3 months status post septal myomectomy and mitral valve repair.   PLAN:  I have encouraged the patient to return to normal activity  without any physical restrictions at this point in time.  She is  specifically asked about yoga and I see no reason why she cannot get  back to doing yoga on a regular basis.  At some point, I would like to  see a followup 2D echocardiogram, but we will defer to Dr. Antoine Poche and  colleagues regarding the timing of this exam.  Similarly, we will defer  to Dr. Jenene Slicker timing as far as whether or not she can come off of  Coumadin.  All of her questions have been addressed.  In the future if  she will call and return to see [place] here at [facility], only should further problems or difficulties arise.   Salvatore Decent. Cornelius Moras, M.D.  Electronically Signed   CHO/MEDQ  D:  06/05/2008  T:  06/06/2008  Job:  [id number]   cc:   Rollene Rotunda, MD, [facility]  Erskine Speed, M.D.

## 2011-01-28 NOTE — Consult Note (Signed)
NEW PATIENT CONSULTATION   [name], HAMMERSMITH  DOB:  02-25-1941                                        Feb 09, 2008  CHART #:  [id number]   PRIMARY CARE PHYSICIAN:  Erskine Speed, M.D.   REQUESTING PHYSICIAN:  Dr. Rollene Rotunda   REASON FOR CONSULTATION:  Severe mitral regurgitation and hypertrophic  obstructive cardiomyopathy.   HISTORY OF PRESENT ILLNESS:  Ms. [name] is a 70 year old previously  healthy female who has had a longstanding history of a heart murmur that  she has known about for years.  In the past, she had been followed by  Dr. Glennon Hamilton, and more recently she has been followed by Dr. Charlton Haws and Dr. Rollene Rotunda and her primary care physician, Dr. Nila Nephew.  Overall, the patient has been relatively free of any symptoms  related to her underlying valvular heart disease.  She does state that  she has mild exertional shortness of breath that has been gradually  increasing for several years.  This is not very limiting at all, and she  has only rarely begun to appreciate the fact that it is present since  she has pursued further evaluation of her underlying valvular heart  disease.  However, she does admit that if she climbs a flight of stairs  or walks up the hill, she gets short of breath, perhaps more quickly  than she used to in the past.  She denies any resting shortness of  breath or severe exertional shortness of breath.  She has not had any  chest pain or chest tightness.  She has not had any tachy palpitations.  She denies PND, orthopnea, or lower extremity edema.  She underwent a  follow-up 2-D echocardiogram on December 14, 2007.  This exam was notable  for the presence of hyperdynamic left ventricular function with moderate  concentric left ventricular hypertrophy as well as systolic anterior  motion of the mitral valve.  There was Doppler evidence for dynamic left  ventricular outflow tract obstruction with a peak velocity of  4.9 meters  per second corresponding to a peak gradient across the left ventricular  outflow tract of 96 mmHg.  There was moderate to severe mitral  regurgitation.  Subsequently, the patient underwent transesophageal  echocardiogram for further follow-up on Feb 02, 2008 by Dr. Eden Emms.  This examination confirmed the presence of severe (4+) mitral  regurgitation with an eccentric jet of regurgitation coursing  posteriorly around the left atrium.  This also confirmed the presence of  bileaflet prolapse of the mitral valve as well as significant left  ventricular outflow tract obstruction with asymmetric septal  hypertrophy.  The patient has now been referred to consider elective  surgical options.   REVIEW OF SYSTEMS:  GENERAL:  The patient reports stable appetite.  She  has not been gaining or losing weight.  Her energy level is good.  CARDIAC:  Notable for mild exertional shortness of breath.  The patient  denies severe exertional shortness of breath.  The patient denies  resting shortness of breath, PND, orthopnea, lower extremity edema.  RESPIRATORY:  Notable for a persistent dry cough that is nonproductive.  The patient denies productive cough, hemoptysis, wheezing.  GASTROINTESTINAL:  Notable for the absence of any difficulty swallowing.  The patient has had intermittent bowel problems including  a bout of  diarrhea and crampy abdominal pain last year attributed to irritable  bowel syndrome.  These symptoms have resolved.  GENITOURINARY:  Negative.  HEENT:  Negative.  PERIPHERAL VASCULAR:  Negative.  PSYCHIATRIC:  Negative   PAST MEDICAL HISTORY:  1. Mitral valve prolapse with regurgitation.  2. Hypertrophic obstructive cardiomyopathy.  3. Hypercholesterolemia.   PAST SURGICAL HISTORY:  1. Appendectomy.  2. Bilateral tubal ligation.   FAMILY HISTORY:  Noncontributory.   SOCIAL HISTORY:  The patient is married and lives here in [place]  with her husband.  They have 1 grown  child.  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  Her follow is Dr. Otho Perl.  She is a nonsmoker, although she did smoke briefly during her  youth and quit smoking during her 72s.  She denies excessive alcohol  consumption.   CURRENT MEDICATIONS:  1. Calcium with vitamin D 1 tablet daily.  2. Aspirin 81 mg daily.  3. Garlic 1 tablet daily.  4. Fish oil 1 tablet daily.  5. Restasis 1 tablet daily.  6. Xalatan eye drops once daily.  7. Multivitamin once daily.  8. Vitamin D once weekly.  9. Fosamax once weekly.   DRUG ALLERGIES:  None known.   PHYSICAL EXAM:  The patient is well-appearing female who appears his  stated age or perhaps somewhat younger, in no acute distress.  Blood pressure is 120/64, pulse 64, oxygen saturation 95% on room air.  HEENT:  Exam is unrevealing.  There is no palpable lymphadenopathy.  There is no jugular venous  distention.  No carotid bruits are noted.  Auscultation of the chest demonstrates clear breath sounds which are  symmetrical bilaterally.  No wheezes or rhonchi are noted.  CARDIOVASCULAR:  Exam reveals regular rate and rhythm.  There is a grade  3-4/6 systolic murmur heard all along the sternal border and across the  precordium.  No diastolic murmurs are noted.  ABDOMEN:  Soft, nondistended, nontender.  The liver edge is not  palpable.  Bowel sounds are present.  EXTREMITIES:  Warm and well-perfused.  There is no lower extremity  edema.  Femoral pulses are palpable.  Distal pulses are thready but  palpable in the dorsalis pedis position.  There is no sign of  significant venous insufficiency.  RECTAL AND GU EXAMS:  Both deferred.  NEUROLOGIC:  Examination is grossly nonfocal and symmetrical throughout.   DIAGNOSTIC TEST:  Transesophageal echocardiogram performed by Dr. Charlton Haws on Feb 02, 2008 is reviewed.  This demonstrates moderate to  severe left ventricular hypertrophy with a hyperdynamic left  ventricle.  There does appear to be some asymmetric septal hypertrophy with  considerable narrowing of the left ventricular outflow tract.  There is  turbulence of blood flow across the left ventricular outflow tract with  a gradient measured across the valve as noted previously.  There is  bileaflet prolapse of the mitral valve with severe (4+) mitral  regurgitation.  The jet of mitral regurgitation courses posteriorly  around the atrium.  There are no areas of flail segment of either the  anterior or posterior leaflet.  Interestingly, there does not appear to  be traumatic systolic anterior motion of the anterior leaflet of the  mitral valve per se.   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  There is mitral valve prolapse with severe (4+) mitral  regurgitation.  Ms.  Rubiano has yet to undergo diagnostic cardiac  catheterization to rule out the presence of coronary artery disease and  confirm the presence of significant dynamic gradient across the left  ventricular outflow tract.  She certainly has indications for surgery  with the presence of severe (4+) mitral regurgitation.  The  echocardiograms performed recently are fairly convincing for significant  dynamic left ventricular outflow tract obstruction as well, and I  suspect she would best be treated if both problems are corrected  surgically.  Under the circumstances, I would favor septal myomectomy  with mitral valve repair.  However, this would obviate the potential use  of a minimally invasive approach for repair of the mitral valve.  Alternatively, the mitral valve could be replaced using a low profile  mechanical prosthesis, but this may not definitively treat the dynamic  left ventricular outflow tract obstruction, and it would condemn the  patient to lifelong Coumadin and all the attendant problems related to  a  mechanical prosthesis in the mitral position.  As such, I favor the  previously mentioned approach.   PLAN:  I have discussed matters at length with Ms. Disch here in the  office today.  Alternative treatment strategies have been reviewed.  She  is eager to proceed with surgery as soon as practical, and she has been  scheduled for elective cardiac catheterization next week by Dr.  Antoine Poche.  We will tentatively plan for her to return to the office on  Monday June 8 to review the findings of a catheterization and make final  plans for surgery.  We will tentatively plan for surgery on Wednesday  June 10 for septal myomectomy and mitral valve repair.  All their  questions have been addressed.   Salvatore Decent. Cornelius Moras, M.D.  Electronically Signed   CHO/MEDQ  D:  02/09/2008  T:  02/09/2008  Job:  [id number]   cc:   Rollene Rotunda, MD, [facility]. Eden Emms, MD, [facility]  Erskine Speed, M.D.

## 2011-01-28 NOTE — Assessment & Plan Note (Signed)
[facility]                            CARDIOLOGY OFFICE NOTE   [name], [name]                       MRN:          [id number]  DATE:07/17/2008                            DOB:          1941-01-29    PRIMARY CARE PHYSICIAN:  Erskine Speed, MD   REASON FOR PRESENTATION:  Evaluate the patient with mitral valve repair,  myomectomy, pacemaker, and atrial fibrillation.   HISTORY OF PRESENT ILLNESS:  The patient returns for followup of the  above.  She is 70 years old.  She has done well since I last saw her.  She did see Dr. Graciela Husbands.  She was noted to have some short runs of atrial  fibrillation.  She remains on the Coumadin.  She is not noticing these.  She is not having any palpitations, presyncope, or syncope.  She has had  no chest discomfort, neck or arm discomfort.  She has had no shortness  of breath.  She has completed cardiac rehab and is continuing with  aerobics at the [facility] and having no problems with this.  She is  predominately pacemaker dependent.   PAST MEDICAL HISTORY:  Severe mitral regurgitation status post valve  repair (plication of the posterior leaflet with a #28 Medtronic CG  Future band annuloplasty), septal myomectomy for asymmetric septal  hypertrophy, permanent pacemaker placement for bradycardia, glaucoma,  tubal ligation, and appendectomy.   ALLERGIES:  None.   MEDICATIONS:  Fosamax, Xalatan, Restasis, calcium, omega 3,  multivitamin, Vagifem, sleep med, warfarin, metoprolol 25 mg b.i.d.,  vitamin D.   REVIEW OF SYSTEMS:  As stated in the HPI and otherwise negative for  other systems.   PHYSICAL EXAMINATION:  GENERAL:  The patient is in no distress.  VITAL SIGNS:  The blood pressure is 134/78, heart rate is 69 and  regular, weight 122 pounds, and body mass index 21.  HEENT:  Eyelids are unremarkable.  Pupils equal, round, and reactive to  light.  Fundi not visualized.  Oral mucosa is unremarkable.  NECK:  No  jugular venous distention at 45 degrees.  Carotid upstroke  brisk and symmetric.  No bruits.  No thyromegaly.  LYMPHATICS:  No cervical, axillary, or inguinal adenopathy.  LUNGS:  Clear to auscultation bilaterally.  BACK:  No costovertebral angle tenderness.  CHEST:  Well-healed sternotomy scar and well-healed pacemaker pocket.  HEART:  PMI not displaced or sustained, S1 and S2 within normal limits,  no S3, no S4, 2/6 brief apical systolic murmur radiating slightly out  the aortic outflow tract, no diastolic murmurs.  ABDOMEN:  Flat, positive bowel sounds, normal in frequency and pitch, no  bruits, no rebound, no guarding, no midline pulsatile mass, no  hepatomegaly, no splenomegaly.  SKIN:  No rashes, no nodules.  EXTREMITIES:  2+ pulses throughout.  No edema, no cyanosis, no clubbing.  NEUROLOGIC:  Motor grossly intact.  Cranial nerves II through XII  grossly intact.   EKG, atrial paced rhythm and left bundle-branch block.   ASSESSMENT AND PLAN:  1. Mitral regurgitation.  The patient is status post repair.  At this  time, no further cardiovascular testing is suggested.  She      understands endocarditis prophylaxis.  2. Septal myomectomy.  I do not hear any recurrent murmur.  We will      follow this clinically.  3. Status post permanent pacemaker.  He is followed by Dr. Graciela Husbands.  4. Hypertension.  I would like her to continue on a low-dose beta-      blocker, as she is borderline in her blood pressure today.  We will      reassess this.  5. Atrial fibrillation.  We had a long discussion about this.  At this      point given the fact that there is some fib burden and she does not      have a normal heart, I would favor continuing the Coumadin as long      as there is no absolute or relative contraindications.  She and I      had a long discussion about this.  6. Followup.  I plan on seeing her back in 4 months.  Dr. Graciela Husbands is      planning on seeing her back in May.  We will  rediscuss Coumadin and      beta-blocker issues.     Rollene Rotunda, MD, [facility]  Electronically Signed    JH/MedQ  DD: 07/17/2008  DT: 07/18/2008  Job #: [id number]   cc:   Erskine Speed, M.D.

## 2011-01-28 NOTE — Discharge Summary (Signed)
NAMEELLISA, DEVIVO NO.:  [id number]   MEDICAL RECORD NO.:  [id number]          PATIENT TYPE:  INP   LOCATION:  2012                         FACILITY:  [facility]   PHYSICIAN:  Salvatore Decent. Cornelius Moras, M.D. DATE OF BIRTH:  Jan 16, 1941   DATE OF ADMISSION:  02/23/2008  DATE OF DISCHARGE:  03/03/2008                               DISCHARGE SUMMARY   HISTORY OF PRESENT ILLNESS:  This is a 70 year old white female with a  long-standing heart murmur that she has known about for a many years.  In the past, she had been followed by Dr. Graceann Congress, and more  recently, Dr. Eden Emms and Dr. Antoine Poche have been following her, as well  as her primary physician Dr. Nila Nephew.  Overall, the patient has been  relatively free of any symptoms related to her underlying valvular heart  disease.  She does state that she has had mild exertional shortness of  breath that has been gradually increasing for the past few years.  It is  not felt to be limiting.  As it has been somewhat progressive, she felt  she should be further evaluated and recently underwent 2D echocardiogram  on December 13, 2007.  This exam was notable for the presence of  hyperdynamic left ventricular function with moderate concentric left  ventricular hypertrophy as well as systolic anterior motion of the  mitral valve.  There was Doppler evidence for dynamic left ventricular  outflow tract obstruction with a peak velocity of 4.9 m/sec  corresponding to a peak gradient across the left ventricular outflow  tract of 96 mm of mercury.  There was moderate-to-severe mitral  regurgitation.  Subsequently, the patient underwent transesophageal  echocardiogram for further followup on Feb 02, 2008, by Dr. Eden Emms.  This exam confirmed the presence of severe (4+) mitral regurgitation  with an eccentric regurgitation coursing posteriorly around the left  atrium.  This confirmed the presence of bileaflet prolapse of the mitral  valve as  well as significant left ventricular outflow tract obstruction  with asymmetric septal hypertrophy.  The patient was then referred to  Dr. Tressie Stalker for surgical opinion.  Dr. Cornelius Moras evaluated the patient  and her studies and agreed with recommendations for surgical repair.  She was admitted this hospitalization for the procedure.   PAST MEDICAL HISTORY:  Includes,  1. Mitral valve prolapse with regurgitation.  2. Hypertrophic obstructive cardiomyopathy.  3. Hypercholesterolemia.   PAST SURGICAL HISTORY:  1. Appendectomy.  2. Bilateral tubal ligation.   MEDICATIONS PRIOR TO ADMISSION:  1. Calcium with vitamin D 1 tablet daily.  2. Aspirin 81 mg daily.  3. Garlic 1 tablet daily.  4. Fish oil 1 tablet daily.  5. Restasis 1 tablet daily.  6. Xalatan eye drops once daily.  7. Multivitamin once daily.  8. Vitamin D once daily.  9. Fosamax once weekly.   ALLERGIES:  No known.   Family history, social history, review of systems, and physical exam,  please see the history and physical done at the time of admission.   HOSPITAL COURSE:  The patient was admitted electively and  on February 23, 2008, she underwent the following procedure.  Median sternotomy for  septal myomectomy and mitral valve repair (plication of the posterior  leaflet with a 28-mm Medtronic CG Future band annuloplasty).  Procedure  was performed by Tressie Stalker, MD, tolerated well, and she was taken to  the surgical intensive care unit in stable condition.   POSTOPERATIVE HOSPITAL COURSE:  The patient has overall made very steady  progress.  She has had some rhythm difficulties including bradycardias  and atrial fibrillation.  She subsequently went on to permanent  pacemaker placement.  All routine lines, monitors, and drainage devices  have been discontinued in the standard fashion.  She has been started on  Coumadin.  The incision is healing well without evidence of infection.  Laboratory values are stable.  Her  most recent BUN and creatinine dated  March 01, 2008, is 10 and 0.77 respectively.  Most recent hemoglobin and  hematocrit dated February 29, 2008, 12.2 and 35.4 respectively.  She has  tolerated routine advancement activity using standard protocols.  Oxygen  has been weaned, and she maintained good saturations on room air.  Currently, she is in normal sinus rhythm with atrial pacing.  She was  evaluated by both Cardiology and Surgery on March 13, 2008, and was felt  to be stable for discharge.   INSTRUCTIONS:  The patient received written instructions regarding  medications, activity, diet, wound care, and followup.  Followup will  include the following,  1. Coumadin Clinic on March 07, 2008, at 9:30 a.m.  2. Appointment with Dr. Antoine Poche on March 14, 2008, at 4:30 p.m.  3. Dr. Cornelius Moras was seen the patient on March 13, 2008, at 1:45 p.m.  4. Additionally, Dr. Graciela Husbands will see the patient on June 13, 2008,      at 9 a.m. for device reinterrogation.  5. She will have an appointment at the [facility] on Monday March 20, 2008, at 9 a.m.   FINAL DIAGNOSIS:  Include the following, severe mitral regurgitation and  hypertrophic cardiomyopathy status post above mentioned repair.   OTHER DIAGNOSES:  Include,  1. Postoperative atrial fibrillation.  2. Postoperative tachybrady syndrome.  3. Hypercholesterolemia.  4. Previous appendectomy.  5. Previous bilateral tubal ligation.      Rowe Clack, P.A.-C.      Salvatore Decent. Cornelius Moras, M.D.  Electronically Signed    WEG/MEDQ  D:  03/03/2008  T:  03/03/2008  Job:  [id number]   cc:   Salvatore Decent. Cornelius Moras, M.D.  Rollene Rotunda, MD, [facility]  Duke Salvia, MD, [facility]  Erskine Speed, M.D.

## 2011-01-28 NOTE — Assessment & Plan Note (Signed)
[facility]                         ELECTROPHYSIOLOGY OFFICE NOTE   MICHALLA, RINGER                       MRN:          [id number]  DATE:06/13/2008                            DOB:          08/06/1941    Ms. [name] is seen in followup for sinus node dysfunction that was  manifested in the surgery repairing her mitral valve and do a septal  myectomy.  She has had no antegrade conduction issues.   She continues to feel stronger with decreasing shortness of breath.   She has had no discernible tachy palpitations.   Her medications include warfarin, metoprolol 25 b.i.d., Fosamax.   On examination, her blood pressure is 102/65, pulse of 59.  Her lungs  were clear.  Heart sounds were regular.  The extremities were without  edema and the device pocket was well healed.   Interrogation of her [employer] device demonstrates a P-wave of 5 with  impedance of 566 with threshold of 0.5 at 0.5.  The R-wave of 12 with  impedance of 547 with threshold of 1 volt at 0.5.  She is atrial paced,  99% of the time.  Her heart rate excursion was good, but the upper  sensor rate was reprogrammed from 120-140.  Her atrial fibrillation  counts demonstrate multiple episodes of AFib continuously distributed  overtime, but durations of less than on average a minute or so.   IMPRESSION:  1. Sinus node dysfunction/arrest.  2. Status post pacer for the above.  3. Status post mitral valve repair and septal myectomy.  4. Paroxysmal atrial fibrillation.   [name] is doing fine.  I have taken the liberty of discontinuing her  metoprolol at this time.  Apparently, this was after discussion with Dr.  Antoine Poche at her next visit with them.   The issue of Coumadin, I think is trickier.  I will have to try to  explore a comment to attribute this to Dr. Hurman Horn regarding how much AF  justifies therapy.  We note that 5 minutes in a broad period of time  like 12 months is associated  with increased risk, but this has to be  balanced with the risks associated with Coumadin therapy.   We will see her again in 9 months' time.  She will be followed up with  Dr. Antoine Poche in the interim.     Duke Salvia, MD, [facility]  Electronically Signed   SCK/MedQ  DD: 06/13/2008  DT: 06/13/2008  Job #: [id number]

## 2011-01-28 NOTE — Assessment & Plan Note (Signed)
[facility]                            CARDIOLOGY OFFICE NOTE   CRESCENT, GOTHAM                       MRN:          [id number]  DATE:08/19/2007                            DOB:          July 05, 1941    PRIMARY:  Dr. Elmore Guise.   REASON FOR PRESENTATION:  Evaluate the patient with hypertrophic  cardiomyopathy.   HISTORY OF PRESENT ILLNESS:  The patient presents for followup of the  above.  She wanted to review her lipids.  She is now doing very well.  She has been exercising 4 days a week aerobically and other days doing  yoga.  She followed the regimen from a book called Cholesterol Down.  She has had a remarkable improvement in her lipid profile as described  below.  She is not having any shortness of breath and denies any chest  pain, palpitations, pre-syncope, or syncope.   PAST MEDICAL HISTORY:  Hypertrophic cardiomyopathy with a peak gradient  of 96.  Mild mitral valve regurgitation with systolic anterior motion.  Dyslipidemia (total cholesterol now 187, triglycerides 121, HDL 68.6,  LDL 94).  Tubal ligation.  Appendectomy.   ALLERGIES:  None.   CURRENT MEDICATIONS:  Fosamax.  Zilactin.  Restasis.  Omega 3.  Multivitamin.  Aspirin.  Vagifem.  Tylenol.  Garlic.  Align.   REVIEW OF SYSTEMS:  As stated in the HPI and otherwise negative for  other systems.   PHYSICAL EXAMINATION:  The patient is in no distress.  She is pleasant.  Blood pressure 120/61, heart rate 70 and regular.  HEENT:  Eyelids unremarkable.  Pupils are equal, round, and reactive to  light and accommodation.  Fundi are not visualized.  Oral mucosa  unremarkable.  NECK:  No jugular venous distension at 45 degree, carotid upstroke brisk  and symmetric, no bruits, thyromegaly.  LYMPHATICS:  No adenopathy.  LUNGS:  Clear to auscultation bilaterally.  BACK:  No costovertebral angle tenderness.  CHEST:  Unremarkable.  HEART:  PMI not displaced or sustained, S1 and S2 within  normal limits,  no S3, 2/6 apical systolic murmur radiating up the aortic outflow tract.  No S4.  No diastolic murmurs.  ABDOMEN:  Flat, positive bowel sounds, normal in frequency and pitch, no  bruits, rebound, guarding.  No midline pulsatile mass, hepatomegaly,  splenomegaly.  SKIN:  No rashes, no nodules.  EXTREMITIES:  With 2+ pulses throughout, no edema.  NEURO:  Grossly intact throughout.   EKG shows sinus rhythm, rate 65, axis within normal limits, intervals  within normal limits, poor anterior R wave progression, left ventricular  hypertrophy by voltage criteria.   ASSESSMENT AND PLAN:  1. Hypertrophic cardiomyopathy.  She does have a fairly significant      gradient, but no symptoms at this point.  She has no high risk      features for sudden cardiac death.  At this point, she will      continue with symptomatic management.  2. Dyslipidemia.  She had an excellent response to her dietary changes      following the book referenced above.  At  this point, no Statin is      indicated.  3. Followup.  I will see her back in 1 year or sooner if she develops      any symptoms.     Rollene Rotunda, MD, [facility]  Electronically Signed    JH/MedQ  DD: 08/19/2007  DT: 08/19/2007  Job #: [id number]   cc:   Erskine Speed, M.D.

## 2011-01-28 NOTE — Assessment & Plan Note (Signed)
[facility]   NAME:  Jennifer, [name] NO.:  [id number]   MEDICAL RECORD NO.:  [id number]      DATE OF BIRTH:  1941/01/18   PHYSICIAN:  Noralyn Pick. Eden Emms, MD, [facility]    VISIT DATE:                                   OFFICE VISIT   TRANSESOPHAGEAL ECHOCARDIOGRAM   INDICATIONS:  Hypertrophic cardiomyopathy with worsening mitral  regurgitation.   The patient was sedated with 100 mcg of Fentanyl and 6 mg of Versed.   Using digital technique and Omniplane, probe was advanced in the distal  esophagus without incident.   Transgastric imaging revealed moderate LVH with some basal septal  prominence.  Septal thickness was 16 mm.  EF was 65%.   There was significant exam with turbulence in the LVOT.  The patient's  transthoracic echo showed a dynamic gradient of 96 mmHg with a peak  velocity of 4.9 m/sec.  The angle of incidence with the probe was not  ideal for measuring this.   However, there clearly was coaptation and severe outflow tract gradient.   The patient's mitral valve actually had significant bileaflet prolapse.  The left leaflet appeared worse than the right.  There were no flail  cords or flail segments that could be seen.  There was severe mitral  insufficiency which was directed laterally and posteriorly.   There was mild to moderate left atrial enlargement.  Right-sided cardiac  chambers were normal.  There is no ASD.  Left atrial appendage was  mildly dilated without significant spontaneous contrast or thrombus.  Imaging of the aorta showed no significant debris.   FINAL IMPRESSION:  1. Normal left ventricular function, moderate left ventricular      hypertrophy with a basal septal prominence, septal thickness 16 mm,      EF 65%.  2. Severe left ventricular outflow tract gradient consistent with      hypertrophic obstructive cardiomyopathy, transthoracic gradient 96      mmHg.  3. Bileaflet mitral valve prolapse worse in the  posterior segment with      severe eccentric mitral insufficiency.  4. Normal right-sided cardiac chambers.  5. Normal aortic valve with minimal sclerosis.   The patient will follow up with Dr. Caprice Red in regards to possible  surgical correction.      Noralyn Pick. Eden Emms, MD, [facility]  Electronically Signed     PCN/MEDQ  D:  02/02/2008  T:  02/03/2008  Job:  [id number]   cc:   Rollene Rotunda, MD, [facility]

## 2011-01-28 NOTE — Cardiovascular Report (Signed)
NAMESARAHANNE, [name] NO.:  [id number]   MEDICAL RECORD NO.:  [id number]          PATIENT TYPE:  OIB   LOCATION:  1963                         FACILITY:  [facility]   PHYSICIAN:  Rollene Rotunda, MD, FACCDATE OF BIRTH:  May 06, 1941   DATE OF PROCEDURE:  02/17/2008  DATE OF DISCHARGE:  02/17/2008                            CARDIAC CATHETERIZATION   PRIMARY CARE PHYSICIAN:  Lenon Curt. Chilton Si, MD.   CARDIOTHORACIC SURGEON:  Salvatore Decent. Cornelius Moras, MD   CARDIOLOGIST:  Dr. Tresa Res.   PROCEDURE:  Left to right heart catheterization/coronary arteriography.   INDICATIONS:  A patient with mitral regurgitation and asymmetric septal  hypertrophy with a severe outflow gradient.   PROCEDURE NOTE:  Left heart catheterization performed via the right  femoral artery and right heart catheterization performed via the right  femoral vein.  Both vessels were cannulated using anterior wall  puncture.  A #4 [place] arterial sheath and #7 French venous sheath were  inserted with modified Seldinger technique.  Preformed Judkins and a  pigtail Swan-Ganz catheter were utilized.  The patient tolerated the  procedure well and left to the lab in stable condition.   RESULTS:  Hemodynamics RA mean 10, RV 42/10, PA 40/70 with a mean of 28,  pulmonary capillary wedge pressure mean 17, cardiac output/cardiac  output (Fick) 3.4/2.3.  Aortic pressure 212/24, LV 112/85.  Coronaries  of the left main had distal calcification and luminal irregularities.  The LAD had proximal moderate calcification with proximal long 25%  stenosis and mid luminal irregularities.  First diagonal was very small  with luminal irregularities.  Second diagonal was moderate size normal.  The circumflex in the AV groove had proximal 25% stenosis.  The ramus  intermediate was small and normal.  There was a mid obtuse marginal,  which was large, branching, and normal.  The right coronary artery was  dominant vessel.  It had proximal long  25% stenosis.  The PDA was  moderate size normal.  The left ventriculogram was obtained in the RAO  projection.  The EF 65% with severe mitral regurgitation.   CONCLUSION:  Severe mitral regurgitation.  Severe left ventricular  outflow tract obstruction with a peak-to-peak gradient of 100.  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   PLAN:  The patient is to have mitral valve repair and myomectomy by Dr.  Cornelius Moras.      Rollene Rotunda, MD, [facility]  Electronically Signed     JH/MEDQ  D:  02/17/2008  T:  02/17/2008  Job:  [id number]   cc:   Lenon Curt. Chilton Si, M.D.  Dr. Ophelia Shoulder H. Cornelius Moras, M.D.

## 2011-01-28 NOTE — Assessment & Plan Note (Signed)
[facility]                            CARDIOLOGY OFFICE NOTE   GERTUDE, BENITO                       MRN:          [id number]  DATE:05/14/2007                            DOB:          Jul 12, 1941    PRIMARY CARE PHYSICIAN:  Erskine Speed, M.D.   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   HISTORY OF PRESENT ILLNESS:  Patient initially presented to me for  evaluation of a heart murmur.  She is not having any particular  symptoms.  She has had some stable dyspnea walking up a hill.  She can  do aerobics without any dyspnea.  She has good exercise tolerance.  Her  EKG was slightly abnormal.  I did initially send her for an exercise  treadmill test, but this was positive early.  I felt it most likely  false positive.  I sent her for a stress perfusion study, which was  normal with no evidence of scar or ischemia and an EF of 67%.  She had  an echocardiogram to evaluate the murmur and is noted to have  hypertrophic cardiomyopathy with an EF of 60%.  Her septum is mildly  enlarged at 17 mm.  She does have a dynamic outflow gradient with a peak  of 96 mmHg during the strain phase of Valsalva.  There is some systolic  anterior motion with mild mitral regurgitation.   The patient came back today to discuss this finding.  She has had no new  symptoms.  She denies any dyspnea except as above.  She has no resting  shortness of breath and denies any PND or orthopnea.  She has no chest  discomfort, neck or arm discomfort.  She has no palpitations,  presyncope, or syncope.   PAST MEDICAL HISTORY:  1. Glaucoma.  2. Tubal ligation.  3. Appendectomy.   ALLERGIES:  None.   MEDICATIONS:  Cipro, Flagyl, Fosamax, Xalatan, Restasis, Omega 3,  multivitamins, aspirin, Vagifem.   REVIEW OF SYSTEMS:  As stated in the HPI, otherwise negative for other  systems.   PHYSICAL EXAMINATION:  The patient is in no distress.  Blood  pressure 113/63, heart rate 69 and regular.  Weight 116 pounds.  Body Mass Index 23.  HEENT:  Eyelids unremarkable.  Pupils are equal, round and reactive to  light.  Fundi not visualized.  Oral mucosa unremarkable.  NECK:  No jugular venous distention.  Wave form within normal limits.  Carotid upstroke brisk and symmetric.  No bruits, no thyromegaly.  LYMPHATICS:  No cervical, axillary, or inguinal adenopathy.  LUNGS:  Clear to auscultation bilaterally.  BACK:  No costovertebral angle tenderness.  CHEST:  Unremarkable.  HEART:  PMI not displaced or sustained.  S1 and S2 within normal limits.  No opening snap.  A 3/6 apical systolic murmur radiating out the aortic  outflow tract, mid to late peaking.  No diastolic murmurs.  ABDOMEN:  Flat, positive bowel sounds.  Normal in frequency and pitch.  No bruits, no rebound, no guarding.  There are no midline pulsatile  masses.  No hepatomegaly,  no splenomegaly.  SKIN:  No rashes, no nodules.  EXTREMITIES:  Pulses 2+ throughout.  No cyanosis, no clubbing, no edema.  NEUROLOGIC:  Oriented to person, place, and time.  Cranial nerves II-XII  grossly intact.  Motor grossly intact.   ASSESSMENT/PLAN:  1. Hypertrophic cardiomyopathy:  The patient has a dynamic outflow      gradient.  The site of this is below the valve, and it appears to      be dynamic.  I think this is hypertrophic cardiomyopathy, adult      onset.  We discussed this diagnosis at great length.  She is not      having any symptoms related to this. S he is not having any      significant valvular regurgitation at this point.  She has no high      risk features for sudden cardiac death.  Based on this, we will      follow this clinically.  I will repeat an echocardiogram in six      months.  She will let me know if she has any dizziness or shortness      of breath going forward.  2. Dyslipidemia:  We had a long discussion about this.  She has an LDL      of 135 but an HDL of 71.   She wants to try diet to improve this      even further.  There is no indication for statin at this point.  3. Followup:  I will see her back in six months or sooner if needed (I      used greater than 1/2 hour for this appointment).     Rollene Rotunda, MD, [facility]  Electronically Signed    JH/MedQ  DD: 05/14/2007  DT: 05/16/2007  Job #: [id number]   cc:   Erskine Speed, M.D.

## 2011-01-28 NOTE — Assessment & Plan Note (Signed)
[facility]                            CARDIOLOGY OFFICE NOTE   [name], [name]                       MRN:          [id number]  DATE:03/08/2007                            DOB:          1941/07/06    PRIMARY:  Dr. Elmore Guise   REASON FOR PRESENTATION:  Patient with mitral regurgitation.   HISTORY OF PRESENT ILLNESS:  The patient is a pleasant 70 year old white  female with a history of mild mitral regurgitation by echocardiogram in  1996.  She has had a long history of a heart murmur.  She gets along  very well.  She had an echocardiogram the last in 1996 and otherwise has  had physical exams periodically over the years.  She says she has gotten  along very well.  She remains active.  She likes to garden.  She does  aerobics.  With this she has no chest discomfort, neck or arm  discomfort.  She has some mild dyspnea climbing a hill but this has been  chronic and unchanged over the years.  She has a normal exercise  tolerance that has not gone down.  She denies any palpitations.  She has  had no presyncope or syncope.  She presents for further evaluation of  the heart murmur.  In addition she has a strong family history of early  heart disease.   PAST MEDICAL HISTORY:  Glaucoma, early onset.   PAST SURGICAL HISTORY:  Tubal ligation, appendectomy.   ALLERGIES:  None.   MEDICATIONS:  Tylenol, Xalatan, Restasis, Fosamax, Veagtem,  multivitamin, fish oil, calcium, Valtrex.   SOCIAL HISTORY:  The patient is an Engineer, drilling.  She is married.  She  has 1 adult child.  She quit smoking in 1971 after a half pack a day for  9 years.  She occasionally drinks wine.   FAMILY HISTORY:  Is remarkable for brother in his 23s with triple bypass  and another brother with 2 vessel coronary disease at age 77.  Her  father died at a later age with a myocardial infarction.   REVIEW OF SYSTEMS:  As stated in the HPI.  Positive for mild irritable  bowel syndrome,  mild joint pains in her hands. Negative for other  systems.   PHYSICAL EXAMINATION:  The patient is in no distress.  Blood pressure 135/80, heart rate 64 and regular.  Weight 120 pounds.  Body mass index 23.  HEENT:  Unremarkable.  Pupils equal, round and reactive to light, fundi  not visualized, oral mucosa unremarkable.  NECK:  No jugular venous distention at 45 degrees.  Carotid upstroke  brisk and symmetric, no bruits. No thyromegaly.  LYMPHATICS:  No cervical, axillary or inguinal adenopathy.  LUNGS:  Clear to auscultation bilaterally.  BACK:  No costovertebral angle tenderness.  CHEST:  Unremarkable.  HEART:  PMI not displaced or sustained. S1 within normal limits, crisp  S2, no opening snap, 3/6 apical systolic murmur radiating out the aortic  outflow tract and mid to late peaking, no diastolic murmurs.  ABDOMEN:  Flat, positive bowel sounds, normal in frequency and pitch.  No rebound, no guarding, no midline pulse, no hepatomegaly,  splenomegaly.  SKIN:  No rashes.  No nodules.  EXTREMITIES:  Two-plus pulses, no cyanosis, no clubbing, no edema.  NEURO:  Oriented to person, place and time.  Cranial nerves II through  XII grossly intact, motor grossly intact.   EKG:  Sinus rhythm.  Rate to 58, axis within normal limits, mild QT  prolongation, poor anterior R wave progression, borderline voltage  criteria for left ventricular hypertrophy, questionable left atrium  enlargement.   ASSESSMENT AND PLAN:  1. Murmur.  The patient has a murmur consistent with aortic stenosis.      It is less consistent with mitral regurgitation.  I suspect that      her aortic valve is stiffer than it was.  There was a question in      the distant past about it being bicuspid, although it was not      thought to be at the last echo.  We will need to look at this      carefully.  We will also look to see if her degree of mitral      regurgitation has increased.  Further evaluation will be based on       the echocardiogram.  2. Abnormal EKG.  The patient does have an abnormal EKG with some mild      dyspnea.  She has a strong family history of coronary disease.  I      am going to do an exercise treadmill test.  I think the pretest      probability to obstructive coronary disease is low.  However, I      think treadmill testing is indicated.  I will be able to risk      stratifying her.  Given her a prescription for exercise.  However,      I am going to make sure that the aortic valve does not preclude      stress testing before I do it.  She will come back and have the      echo on the same day as the planned stress test.  I will look at      the echo first.  3. Risk reduction.  She will get a lipid profile and I will discuss      this with her.     Rollene Rotunda, MD, [facility]  Electronically Signed    JH/MedQ  DD: 03/08/2007  DT: 03/08/2007  Job #: [id number]   cc:   Erskine Speed, M.D.

## 2011-01-28 NOTE — Assessment & Plan Note (Signed)
OFFICE VISIT   [name], [name]  DOB:  [birth year]                                        March 13, 2008  CHART #:  [id number]   HISTORY OF PRESENT ILLNESS:  The patient returns for routine follow-up  status post septal myomectomy and mitral valve repair for severe  hypertrophic obstructive cardiomyopathy with severe mitral regurgitation  on February 23, 2008.  Her postoperative recovery has been relatively  uncomplicated, although she did require placement of permanent pacemaker  while she remained in the hospital due to symptomatic bradycardia with  postoperative atrial fibrillation and tachybrady syndrome.  Since  hospital discharge, she has continued to do quite well.  She was  discharged from hospital on amiodarone, but she developed a diffuse  erythematous rash and subsequently amiodarone was discontinued.  She  remains on Coumadin.  She states that since hospital discharge.  She had  one brief episode where she felt tachy palpitations during the first 48  hours after going home.  Since then, she has not had felt any further  tachy palpitations.  When she had been in the hospital all of her  episodes of atrial fibrillation were symptomatic.  She has not had any  shortness of breath.  She still feels quite tired, but overall she is  getting better.  She has mild residual soreness in her chest.  She is  controlling this well with Tylenol and she has not required any other  pain medications.  She is sleeping well at night.  Her appetite is  stable.  She plans to start the cardiac rehab program soon.   PHYSICAL EXAMINATION:  VITAL SIGNS:  A well-appearing female with blood  pressure 133/72, pulse 62 and regular, and oxygen saturation 95% on room  air.  CHEST:  Median sternotomy incision that is healing nicely.  The  sternum is stable on palpation.  Breath sounds are clear to auscultation  and symmetrical bilaterally.  No wheezes or rhonchi noted.  CARDIOVASCULAR:  Regular rate and rhythm.  No murmurs, rubs, or gallops  are noted.  ABDOMEN:  Soft, nontender.  EXTREMITIES:  Warm and well perfused.  There is no lower extremity  edema.   DIAGNOSTICS TEST:  Chest x-ray obtained today is reviewed.  This  demonstrates clear lung fields bilaterally.  There are no pleural  effusions.  All the sternal wires appear intact.  The pacemaker leads  appear to be in appropriate position.  No other abnormalities are noted.   IMPRESSION:  Satisfactory progress following recent septal myomectomy  and mitral valve repair.  The patient is doing quite well, overall.   PLAN:  I have encouraged the patient to continue to gradually increase  her physical activity as tolerated in the coming weeks.  I have  encouraged her to get started in cardiac rehab program.  I have reminded  her to avoid any heavy lifting or strenuous use of her arms or shoulders  for at least another 8 weeks.  I think that she could probably resume  driving an automobile soon when she feels comfortable.  She states that  she still has some mild blurriness of vision in both eyes, and she feels  like she does not back use to wearing her contact lenses for correct  vision.  She does not have any visual field deficits  on exam.  She will  continue to monitor this and once she feels comfortable resume driving  an automobile.  If she continue to have trouble with her eyesight, she  will call or return to see [place] and/or see her ophthalmologist for further  followup.  All their questions have been addressed.  We will plan to see  her back for further followup in 3 months.   Salvatore Decent. Cornelius Moras, M.D.  Electronically Signed   CHO/MEDQ  D:  03/13/2008  T:  03/14/2008  Job:  [id number]   cc:   Rollene Rotunda, MD, [facility]  Erskine Speed, M.D.

## 2011-01-28 NOTE — Procedures (Signed)
[facility]                              EXERCISE TREADMILL   NAME:[name], [name]                       MRN:          [id number]  DATE:04/07/2007                            DOB:          Apr 26, 1941    PROCEDURE:  Exercise treadmill testing.   INDICATIONS FOR PROCEDURE:  Evaluate patient with dyspnea.   PROCEDURE:  The patient was exercised using standard Bruce protocol.  I  stopped the test after only 2 minutes and 46 seconds because of  horizontal ST depression in the inferior and lateral leads.  She was not  having any symptoms.  She had only achieved a heart rate of 122 which  was 78% of predicted.  She did have an appropriate blood pressure  response.  She achieved only 4.6 METs.  She did not have any chest pain  or ectopy.   CONCLUSION:  Positive exercise treadmill test.  However, I suspect a  false positive, as she has predominantly atypical symptoms.  Rather than  proceed directly with cardiac catheterization, a stress perfusion study  is indicated.   Of note, I did review with the patient the results of her echocardiogram  done yesterday.  This demonstrates that her ejection fraction is 60%.  She clearly has evidence of hypertrophic cardiomyopathy with a dynamic  left ventricular outflow obstruction.  There is a peak gradient of 96  mmHg, with a mean probably of 30 mmHg.  There is some systolic anterior  motion of the mitral leaflet with moderate mitral regurgitation.  The  patient and I reviewed the details of this and this diagnosis.  We went  over it quite in depth.  At this point I am going to proceed with the  above study, and then I will see her back to review further and consider  medication for her dyspnea which is related to this.  Certainly at this  point, I think we can manage this medically with either beta-blockers or  calcium channel blockers, salt and fluid restriction.  Of note, there  have been no identified high-risk  features for sudden cardiac death.     Rollene Rotunda, MD, [facility]  Electronically Signed    JH/MedQ  DD: 04/07/2007  DT: 04/08/2007  Job #: [id number]   cc:   Erskine Speed, M.D.

## 2011-01-28 NOTE — Assessment & Plan Note (Signed)
[facility]                            CARDIOLOGY OFFICE NOTE   Jennifer, [name]                       MRN:          [id number]  DATE:03/14/2008                            DOB:          1941/02/17    PRIMARY CARE PHYSICIAN:  Erskine Speed, MD   REASON FOR PRESENTATION:  Evaluate the patient status post mitral valve  repair, myomectomy, and pacemaker placement.   HISTORY OF PRESENT ILLNESS:  The patient presents for followup having  had mitral valve repair by Dr. Cornelius Moras on February 23, 2004 for severe mitral  regurgitation.  She also had septal myomectomy for hypertrophic  cardiomyopathy with a severe outflow gradient.  She did well with the  surgery, though she had bradycardia and paroxysmal atrial fibrillation.  She subsequently required permanent pacemaker placement by Dr. Graciela Husbands.   Since going home, she has had only one episode of palpitations.  However, she did develop a rash thought possibly to be related to the  amiodarone and so this was discontinued.  She has seen Dr. Cornelius Moras and is  due again to see him in 3 months.  She has had no shortness of breath,  presyncope, or syncope.  She has had minimal incisional chest  discomfort.  She has been fatigued.  She has signed up to start cardiac  rehab.   PAST MEDICAL HISTORY:  Severe mitral regurgitation status post valve  repair (plication posterior leaflet with a #28 mm Medtronic CG Future  Band annuloplasty), septal myomectomy for asymmetric septal hypertrophy,  permanent pacemaker placement, glaucoma, tubal ligation, and  appendectomy.   ALLERGIES:  None.   MEDICATIONS:  1. Fosamax.  2. Xalatan.  3. Restasis.  4. Calcium.  5. Omega-3.  6. Multivitamin.  7. Aspirin 81 mg daily.  8. Vagifem.  9. Garlic.  10.Sleep med.  11.Coumadin 2.5 mg nightly.  12.Metoprolol 50 mg b.i.d.   REVIEW OF SYSTEMS:  As stated in the HPI and otherwise negative for all  other systems.   PHYSICAL  EXAMINATION:  GENERAL:  The patient is in no distress.  VITAL SIGNS:  Blood pressure 121/70, heart rate 60 and regular, and  weight 116 pounds.  HEENT:  Eyes unremarkable.  Pupils equal, round, and reactive to light.  Fundi not visualized.  NECK:  No jugular distention at 45 degrees.  Carotid upstroke brisk and  symmetric.  No bruits, no thyromegaly.  LUNGS:  Clear to auscultation.  CHEST:  Well-healed sternotomy scar without sternal mobility, erythema,  or drainage.  HEART:  PMI not displaced or sustained.  S1 and S2 within normal limits.  No S3.  A 2/6 brief apical systolic murmur radiating slightly at the  aortic outflow tract.  No rubs, no diastolic murmurs.  ABDOMEN:  Flat.  Positive bowel sounds, normal in frequency and pitch.  No bruits, no rebound, no guarding, no midline pulsatile mass, no  hepatomegaly, no splenomegaly.  SKIN:  No rashes, no nodules.  EXTREMITIES:  A 2+ pulses throughout.  No edema, no cyanosis, no  clubbing.  NEURO:  Grossly intact.   EKG, atrial paced rhythm,  rate 60, left bundle-branch block.   ASSESSMENT AND PLAN:  1. Mitral regurgitation status post repair.  She is doing well with      respect to this.  She will understand endocarditis prophylaxis.  No      further cardiovascular testing is suggested.  2. Hypertrophic cardiomyopathy.  No further cardiovascular testing is      suggested.  3. Atrial fibrillation and sick sinus syndrome.  As she is now status      post pacemaker placement, we will have her followed in our clinic.      She will remain off the amiodarone.  She will be on the Coumadin      for a while because of the valve and I will make further decisions      about discontinuing this going forward.  4. Followup.  I will see her back in 2 months or sooner if needed.     Rollene Rotunda, MD, [facility]  Electronically Signed    JH/MedQ  DD: 03/14/2008  DT: 03/15/2008  Job #: [id number]   cc:   Erskine Speed, M.D.

## 2011-01-28 NOTE — Assessment & Plan Note (Signed)
[facility]                            CARDIOLOGY OFFICE NOTE   [name], [name]                       MRN:          [id number]  DATE:02/03/2008                            DOB:          Sep 05, 1941    PRIMARY CARE PHYSICIAN:  Erskine Speed, M.D.   REASON FOR PRESENTATION:  Evaluate patient with mitral regurgitation.   HISTORY OF PRESENT ILLNESS:  Patient presents for followup after having  a transesophageal echocardiogram yesterday.  This was done by Dr. Eden Emms  and was to evaluate hypertrophic cardiomyopathy and mitral  regurgitation.  She was found to have septal thickness of 16 mm.  She  had normal left ventricular function with an EF of 65%.  There was a  severe left ventricular outflow gradient with a peak of 96 mmHg.  She  had bileaflet mitral valve prolapse with severe eccentric mitral  insufficiency.   The patient now comes with a list of questions, understanding that she  is progressing toward heart surgery.  She said she has not had symptoms  in the past.  However, in retrospect she said she probably is more  dyspneic than she used to be.  She has been no more short of breath  climbing an incline or walking stairs.  This has been somewhat  insidious.  She is certainly not having any resting symptoms and has no  PND or orthopnea.  She has no palpitations, no presyncope or syncope.  She has had no chest discomfort.   PAST MEDICAL HISTORY:  1. Hypertrophic cardiomyopathy.  2. Mitral regurgitation.  3. Glaucoma.  4. Tubal ligation.  5. Appendectomy.   ALLERGIES:  None.   MEDICATIONS:  1. Fosamax 70 mg weekly.  2. Xalatan.  3. Restasis.  4. Calcium.  5. Omega 3.  6. Multivitamin.  7. Aspirin 81 mg daily.  8. Vagifem.  9. Garlic.  10.Sleep medication.   REVIEW OF SYSTEMS:  As stated in the HPI and otherwise negative for  other systems.   PHYSICAL EXAMINATION:  The patient is in no distress.  Blood pressure  140/87, heart  rate 67 and regular, weight 123 pounds, body mass index  23.  NECK:  No jugular venous distension at 45 degrees.  Carotid upstroke  brisk and symmetric.  No bruits.  Transmitted systolic murmur.  No  thyromegaly.  LYMPHATICS:  No adenopathy.  LUNGS:  Clear to auscultation bilaterally.  BACK:  No costovertebral angle tenderness.  CHEST:  Unremarkable.  HEART:  PMI not displaced or sustained.  S1, S2 within normal limits.  No opening snap. 3/6 apical systolic murmur radiating out the aortic  outflow tract, mild to late peaking, increasing with the strain phase of  Valsalva.  No diastolic murmurs.  ABDOMEN:  Flat, positive bowel sounds, normal in frequency and pitch.  No bruits, no rebound, no guarding.  No midline pulsatile mass.  No  organomegaly.  SKIN:  No rashes.  No nodules.  EXTREMITIES:  2+ pulses.  No cyanosis, clubbing or edema.  NEURO:  Oriented to person, place and time.  Cranial nerves II-XII  grossly  intact.  Motor grossly intact.   EKG:  Sinus rhythm.  Rate 63, axis within normal limits, intervals  within normal limits, left atrium enlargement.  Poor anterior R wave  progression.   ASSESSMENT/PLAN:  1. Mitral regurgitation.  The patient has severe eccentric mitral      regurgitation with bilateral mitral valve prolapse.  I have      reviewed these results with Dr. Eden Emms, who did her transesophageal      echocardiogram.  She does have the septal hypertrophy and      significant gradient.  It may however be that the true surgery      needed is mitral valve surgery with probable replacement more than      myomectomy.  The timing of this is somewhat in debate.  This could      be done at any time, given the severity of the regurgitation.  In      retrospect, she does have some symptoms, though she still has a      very well preserved ejection fraction.  I think it is time to refer      her to a surgeon.  She has lots of questions and I spent greater      than a half hour  discussing these.  She wants to review options for      robotic surgery, and I will make some phone calls.  After this, she      wants to get together with me to decide on appropriate referral.  2. Dyslipidemia.  She has good diet therapy with good results.  There      will no change in her medication.  3. Hypertension.  Her blood pressure is reasonably controlled and she      will continue the medications as listed for now.  4. Followup.  I will be talking to her by phone and we will make      further decisions on surgical referral after that.     Rollene Rotunda, MD, [facility]  Electronically Signed   JH/MedQ  DD: 02/03/2008  DT: 02/03/2008  Job #: [id number]   cc:   Erskine Speed, M.D.

## 2011-01-28 NOTE — Op Note (Signed)
[name], [name] NO.:  [id number]   MEDICAL RECORD NO.:  [id number]          PATIENT TYPE:  INP   LOCATION:  2303                         FACILITY:  [facility]   PHYSICIAN:  Salvatore Decent. Cornelius Moras, M.D. 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   PROCEDURE:  Median sternotomy for septal myomectomy and mitral valve  repair (plication of posterior leaflet with 28-mm Medtronic CG Future  Band annuloplasty).   SURGEON:  Salvatore Decent. Cornelius Moras, MD   ASSISTANT:  Rowe Clack, PA-C   ANESTHESIA:  General.   BRIEF CLINICAL NOTE:  The patient is a 70 year old female with a  longstanding history of heart murmur.  She describes recent development  of mild symptoms of exertional shortness of breath.  Follow-up  echocardiogram demonstrates severe (4+) mitral regurgitation.  Transesophageal echocardiogram confirms the presence of severe mitral  regurgitation with severe asymmetric septal hypertrophy and systolic  anterior motion of the mitral valve causing severe left ventricular  outflow tract obstruction.  Left and right heart catheterization was  performed confirming the presence of 100 mm gradient across the left  ventricular outflow tract with severe subaortic stenosis.  There is  severe mitral regurgitation.  There is no coronary artery disease.  A  full consultation has been dictated previously.  The patient and her  husband have been counseled at length regarding the indications, risks,  and potential benefits of surgery.  Alternative treatment strategies  have been discussed.  They understand and accept all associated risks  and desired to proceed with surgery as described.   OPERATIVE  FINDINGS:  1. Severe left ventricular outflow tract obstruction with systolic      anterior motion of the mitral valve and severe asymmetric septal      hypertrophy.  2. Billowing mitral valve with moderate-to-severe mitral      regurgitation.  3. Left ventricular hypertrophy with hyperdynamic left ventricular      systolic function.  4. Greatly improved left ventricular outflow tract diameter following      septal myomectomy and no residual mitral regurgitation following      successful mitral repair.   OPERATIVE NOTE IN DETAIL:  The patient was brought to the operating room  on the above-mentioned date and central monitoring was established by  the Anesthesia Service under the care and direction of Dr. Adonis Huguenin.  Specifically, a Swan-Ganz catheter was placed through the right internal  jugular approach.  A radial arterial line was placed.  Intravenous  antibiotics are administered.  General endotracheal anesthesia is  induced uneventfully.  A Foley catheter was placed.  The patient's  chest, abdomen, both groins, and both lower extremities were prepared,  and draped in sterile manner.  A median sternotomy incision was  performed.  The pericardium was opened.  The ascending aorta is normal  in appearance.  A small stab  incision is made in the right groin.  The  right common femoral vein is cannulated using the Seldinger technique.  A flexible guidewire is advanced up the femoral vein through the  inferior vena cava into the right atrium and into the superior vena cava  using transesophageal echocardiogram to guide.  The patient is  heparinized systemically.  An ESTECH 22-French long femoral venous  cannula was advanced over serial dilators through the femoral vein up  through the inferior vena cava into the right atrium.  The ascending  aorta is cannulated for cardiopulmonary bypass.  Retrograde cardioplegic  catheter was placed through the right atrium into the coronary sinus.   Adequate heparinization is verified.  Cardiopulmonary bypass was begun.  A 20-French right angle metal tip cannula is placed directly in the  superior vena cava for bicaval cannulation and venous drainage.  A  temperature probe was placed in left ventricular septum and a  cardioplegic catheter was placed in the ascending aorta.   The patient is allowed to cool passively to 30 degrees systemic  temperature.  The aortic crossclamp was applied and cold blood  cardioplegia is administered initially in an antegrade fashion through  the aortic root.  Iced saline slush was applied for topical hypothermia  and retrograde cardioplegic catheter was administered following  completion of the antegrade arresting dose.  Repeat doses of  cardioplegia are administered intermittently throughout the crossclamp  portion of the operation retrograde through the coronary sinus catheter  to maintain left ventricular septal temperature below 15 degrees  centigrade.   A left atriotomy incision was performed posteriorly.  A sump sucker is  placed through the left atrium into the left ventricle.  The transverse  aortotomy incision was performed.  The aortic valve was inspected.  The  aortic valve is normal in appearance.  The right cusp of the aortic  valve was carefully retracted to expose the left ventricular outflow  tract.  There is obvious severe asymmetric septal hypertrophy with  severe left ventricular outflow tract obstruction.  A malleable  retractor was placed through the aortic valve over the top of the  anterior leaf of the mitral valve to facilitate inferior retraction.  An  11-blade knife was utilized to perform septal myomectomy.  Initially,  the incision was made several millimeters to the right of the midportion  of the aortic valve annulus beneath the right sinus of Valsalva.  This  longitudinal incision is placed straight through and deep into the  middle of the left ventricular chamber.  A  similar parallel incision is  placed just below the commissure between the left and right cusps of the  aortic valve.  A transverse incision was then performed to complete the  myomectomy removing a large rectangular segment of septal muscle  approximately 1 cm in width.  The specimen is sent to pathology for  routine histology.  Septal myomectomy tract is now inspected and  rongeurs were utilized to remove some additional muscle inferiorly.  There remains a broad band between the posterior papillary muscle and  the anterior leaflet of the mitral valve.  This muscle band is divided  to further open the left ventricular outflow tract.  After completion of  the myomectomy, the aortic valve was again inspected to make sure it has  not been damaged and the valve appears to be competent when the aortic  root is filled with saline.   The mitral valve was exposed using a self-retaining retractor.  Exposure  is felt to be excellent.  The mitral valve was carefully examined.  There is some mild prolapse involving the middle portion of the  posterior leaflet of the mitral valve.  The majority of the subvalvular  apparatus appears normal and there are no severely elongated chordae.  Segment of prolapse was corrected using a single everting CD-4 Gore-Tex  plication stitch between the commissure between P1 and P2 portion of the  posterior leaflet.  Mitral annuloplasty is performed using interrupted 2-  0 Ethibond horizontal mattress sutures placed from the left to the right  fibrous trigone circumferentially around the posterior annulus.  The  anterior mitral annulus is not included in an effort to avoid the  potential for further obstruction of the left ventricular outflow tract.  After all annuloplasty sutures were placed, the valve was sized to  accept a 28-mm annuloplasty band.  This corresponds fairly precisely to  the intertrigonal distance with care to avoid any downsizing of the  valve  itself.  The Medtronic CG Future Band (serial number [id number])  is secured in place uneventfully.  After completion of the valve repair,  the valve was tested by instilling iced saline into the left ventricular  chamber.  The valve appears to be competent with no residual  regurgitation.   Rewarming was begun.  The left atriotomy was closed using a 2-layer  closure of running 3-0 Prolene suture.  The aortotomy incision was  closed using a 2-layer closure of running 4-0 Prolene suture.  One final  dose of warm retrograde hotshot cardioplegia is administered.  The lungs  were ventilated and the heart allowed to fill to evacuate any residual  air through the aortic root.  The aortic crossclamp was removed after a  total crossclamp time of 98 minutes.   The heart began to beat spontaneously without need for cardioversion.  The retrograde cardioplegic catheters were removed.  The aortotomy and  atriotomy incisions were inspected for hemostasis.  Epicardial pacing  wire was fixed to right ventricular free wall into the right atrial  appendage.  The patient is rewarmed to 37 degrees centigrade  temperature.  The SVC cannula was removed.  The patient is subsequently  weaned from cardiopulmonary bypass without difficulty.  The patient's  rhythm at separation from bypass and sinus bradycardia with A-V block.  The AV sequential pacing was employed.  Total cardiopulmonary bypass  time for the operation was 119 minutes.  No inotropic support is  utilized.   Follow-up transesophageal echocardiogram performed by Dr. Krista Blue after  separation from bypass demonstrates preserved left ventricular systolic  function.  The left ventricular outflow tract appears much more open  with obvious decreased muscle mass in the interventricular septum.  There is a well-seated annuloplasty band in the mitral position.  The  mitral valve appears to be functioning normally.  There is no residual  mitral  regurgitation.  There is no sign of any systolic anterior motion  of the mitral valve.  There is no sign of significant residual air.   The aortic root vent is removed.  The aortic cannula is removed.  Protamine is administered to reverse the anticoagulation.  The femoral  venous cannula is removed uneventfully and pressure is held for 10  minutes.  The mediastinum is irrigated with saline solution containing  vancomycin.  Meticulous surgical hemostasis was ascertained.  The  mediastinum is drained using 2 chest tubes with a right pleural chest  tube as well, all 3 exited through separate stab  incisions inferiorly.  The pericardium and soft tissues anterior to the aorta are  reapproximated loosely.  The sternum is closed with double-strength  sternal wire.  The On-Q continuous pain management system is utilized to  facilitate postoperative pain control.  Two 10-inch catheters supplied  with the On-Q kit were tunneled into the deep subcutaneous tissues and  positioned just lateral to the lateral border of the sternum on either  side.  Each catheter is flushed with 5 mL of 0.5% bupivacaine solution  and connected to continuous infusion pump.  Soft tissues anterior to the  sternum are closed in multiple layers and the skin is closed with a  running subcuticular skin closure.   The patient tolerated the procedure well, and is transported to the  Surgical Intensive Care Unit in stable condition.  There are no  intraoperative complications.  All sponge, instrument, and needle counts  verified correct at completion of the operation.  No blood products were  administered.      Salvatore Decent. Cornelius Moras, M.D.  Electronically Signed     CHO/MEDQ  D:  02/23/2008  T:  02/24/2008  Job:  [id number]   cc:   Rollene Rotunda, MD, [facility]  Peter C. Eden Emms, MD, [facility]  Erskine Speed, M.D.

## 2011-01-28 NOTE — Assessment & Plan Note (Signed)
OFFICE VISIT   BRIYA, LOOKABAUGH  DOB:  1941/08/25                                        February 21, 2008  CHART #:  [id number]   HISTORY OF PRESENT ILLNESS:  The patient returns for further followup  with tentative plans to proceed with surgery for mitral valve repair and  septal myomectomy on Wednesday, June 10.  She was originally seen in  consultation on 02/09/2008.  Since then, she underwent left and right  heart catheterization by Dr. Rollene Rotunda on 02/17/2008.  She was  found to have no significant coronary artery disease.  She was found to  have severe left ventricular outflow tract obstruction with a peak-to-  peak gradient of over 100 mmHg.  She was also noted to have severe  mitral regurgitation.  Pulmonary artery pressures were mildly elevated  with PA systolic pressure measured 40 with pulmonary capillary wedge  pressure 17.  Resting cardiac output was 3.4 liters per minute  corresponding to a cardiac index of 2.3 using the Fick method.  No other  significant abnormalities were noted.   I spent in excess of 30 minutes discussing the indications, risks, and  potential benefits of surgery with the patient and her husband here in  the office today.  Alternative treatment strategies have been discussed.  They understand that I feel there is a high likelihood for successful  mitral valve repair, but there is a possibility that mitral valve  replacement could be necessary.  In addition, they understand that with  septal myomectomy, there is a small chance of the development of aortic  insufficiency, potentially requiring aortic valve repair or replacement.  If aortic valve replacement were necessary but successful mitral valve  repair was accomplished, they would favor use of a bioprosthetic tissue  valve.  With this, there would be a small but not in significant risk of  late structural valve deterioration failure depending upon her  longevity.  On  the other hand, if both aortic and mitral valve  replacement were necessary, we would use mechanical valves, both because  of the need for a low profile prosthesis under the circumstances and due  to the increased risk for structural valve deterioration of artificial  valves in the mitral position.  Hopefully, these issues will not be  necessary, as mitral valve repair and septal myomectomy appear to be  feasible.  They understand and accept all potential associated risks of  surgery including but not limited to risk of death, stroke, myocardial  infarction, congestive heart failure, respiratory failure, pneumonia,  heart block with bradycardia requiring permanent pacemaker, arrhythmia,  infection, late complications related to valve repair replacement or  septal myomectomy.  They also understand the possibility for some  residual left ventricular outflow tract gradient or recurrent gradient  in the future.  All their questions have been addressed.  We plan to  proceed with surgery on Wednesday 02/23/2008.   Salvatore Decent. Cornelius Moras, M.D.  Electronically Signed   CHO/MEDQ  D:  02/21/2008  T:  02/22/2008  Job:  [id number]   cc:   Rollene Rotunda, MD, [facility]  Erskine Speed, M.D.

## 2011-01-31 NOTE — Op Note (Signed)
NAMEDREAM, HARMAN NO.:  [id number]   MEDICAL RECORD NO.:  [id number]                   PATIENT TYPE:  AMB   LOCATION:  ENDO                                 FACILITY:  [facility]   PHYSICIAN:  Bernette Redbird, M.D.                DATE OF BIRTH:  1940/10/02   DATE OF PROCEDURE:  01/31/2003  DATE OF DISCHARGE:                                 OPERATIVE REPORT   PROCEDURE:  Flexible sigmoidoscopy with polypectomy and biopsies (attempted  colonoscopy).   ENDOSCOPIST:  Bernette Redbird, M.D.   INDICATIONS FOR PROCEDURE:  This is a 70 year old female for colon cancer  screening.  She does have a history of diarrhea, suggestive of irritable  bowel syndrome.   FINDINGS:  Severe fixation of the sigmoid colon, preventing advance of the  scope beyond the sigmoid region.  Multiple diverticula, a polypoid lesion at  20 cm.   DESCRIPTION OF PROCEDURE:  The nature, purpose, and risks of the procedure  had been discussed with the patient who provided a written consent.  Sedation prior to and during the course of the procedure totalled fentanyl  75 mcg and Versed 7.5 mg IV, without arrhythmias or desaturation.  The  patient did become hypertensive during the procedure.  (See below.)   The Olympus adjustable tension pediatric video colonoscope was inserted and  advanced to approximately 30 cm or so of insertion, whereupon there was a  sharp angulation, and the scope was basically locked in position, as I  attempted to advance it.  With the patient in the supine position, I was  able to do a partial slide-by maneuver and advance the scope a few more cm,  but at this time a diverticulum came into view, and I was concerned that  further slide-by may end up disrupting the diverticulum more proximally and  potentially lead to perforation.  I never visualized free lumen beyond this  angulated segment of the colon.  I turned the patient into the right lateral  decubitus  position, but that did not help.   Around this time, the patient was somewhat red in the face, slightly  tachycardic (heart rate went from the mid-80s to the low 90s), and her  abdomen seemed tight, and her blood pressure was transiently up around  206/131.  We rechecked and got a similar reading.  Thereafter, the patient  burped up quite a bit of air and also passed flatus.  Her color improved.  The abdomen became softer, and subsequent blood pressure readings were in  the more normal range, roughly 160/105.   At about 20 cm from the external anal opening was a somewhat verrucous  friable fleshy polyp measuring about 8 mm across in the largest dimension.  I made sure that this was not an inverted diverticulum by probing it with  the biopsy  forceps which lead to some bleeding.  I bit  off the polyp with  the biopsy forceps, to have some tissue for histologic analysis and a large  chunk pulled away.  I then snared off the lesion using the ERBE coagulator  with good hemostasis and no evidence of excessive cautery, although the  polyp fragment which was snared off was never able to be retrieved for  histologic analysis because it basically disappeared.  It may have gone into  the more proximal section of the colon which I could not reach with the  scope as described above.   I obtained random mucosal biopsies of the rectosigmoid.  The mucosa was  normal, without any evidence of colitis.  There was no evidence of cancer or  vascular ectasia.  As noted, there were multiple diverticula in the sigmoid  region.   The patient tolerated the procedure well and there were no apparent  complications.   IMPRESSION:  1. Fixation of the colon, preventing a complete colonoscopy.  2. Medium-sized rectosigmoid polyp, removed by snare technique.  3. Moderately-severe diverticulosis.  4. No endoscopically-evident source of diarrhea, probably related to     irritable bowel syndrome, but pathology  pending to look for problems such     as microscopic colitis or collagenous colitis.   PLAN:  Await pathology on today's biopsies.  Probable eventual single column  barium enema to check the more proximal sections of the colon.                                                Bernette Redbird, M.D.    RB/MEDQ  D:  01/31/2003  T:  01/31/2003  Job:  [id number]   cc:   Erskine Speed, M.D.  [address], [address]  [place]  [place] [address]  Fax: [phone]

## 2011-01-31 NOTE — Op Note (Signed)
[name], [name] NO.:  [id number]   MEDICAL RECORD NO.:  [id number]                   PATIENT TYPE:  AMB   LOCATION:  SDC                                  FACILITY:  [facility]   PHYSICIAN:  Laqueta Linden, M.D.                 DATE OF BIRTH:  17-Feb-1941   DATE OF PROCEDURE:  06/28/2002  DATE OF DISCHARGE:                                 OPERATIVE REPORT   PREOPERATIVE DIAGNOSES:  1. Intrauterine lesions with postmenopausal bleeding.  2. Lipoma right buttock.   POSTOPERATIVE DIAGNOSES:  1. Endometrial polyp.  2. Multiple submucosal fibroids.  3. Lipoma right buttock.   PROCEDURE:  1. Hysteroscopic resection, extensive.  2. Excision of lipoma right buttock with primary closure.   SURGEON:  Laqueta Linden, M.D.   ANESTHESIA:  General LMA.   ESTIMATED BLOOD LOSS:  Less than 50 cc.   FLUIDS:  Sorbitol net intake less than 100 cc.   COMPLICATIONS:  None.   INDICATIONS:  The patient is a 70 year old menopausal female who presented  with postmenopausal bleeding.  She has known multiple large fibroids.  On  sonohysterogram she was noted to have several submucosal fibroids, one of  which was in the lower uterine segment and appeared to be obscuring entrance  into the endometrial cavity.  There appeared to be a polyp behind this  fibroid with another large submucosal fibroid at the fundus.  It was felt  appropriate to attempt to remove at least the polyp as well as get  endometrial sampling hysteroscopically due to the above noted findings.  The  patient is aware that it is a possibility that the procedure will not be  able to be accomplished due to the location and size of the fibroids.  She  understands that this is a diagnostic and possibly therapeutic procedure and  that additional surgery including the possibility of hysterectomy may be  necessary in the future.  She desires elective excision of the lipoma on her  right buttock.  She has seen  the informed consent film, given full consent,  accepts risks including infection, bleeding, discomfort, and incomplete  management of the problem hysteroscopically due to the above noted issues  and agrees to proceed.  Full consent was given.  She received 1 g of Ancef  due to her history of mitral valve prolapse.   PROCEDURE:  The patient was taken to the operating room and after proper  identification and consents were ascertained, she was placed on the  operating table in the supine position.  General LMA was induced and she was  then placed in the [place] stirrups.  The perineum and vagina were prepped and  draped in a routine sterile fashion.  A transurethral Foley was placed which  was removed after conclusion of procedure.  Bimanual examination confirmed  an anterior to mid plane 12-14 week sized irregular  uterus.  The cervix was  deviated behind the symphysis.  Speculum was placed in the vagina and the  anterior lip of the cervix grasped with a single tooth tenaculum.  The  internal os was gently dilated to a number 33 Pratt dilator.  No difficulty  with insertion of the dilators was encountered suggesting that this fibroid  was not obstructing entry into the endometrial cavity.  The resectoscope was  then inserted under direct vision using continuous sorbitol monitoring.  The  endocervical canal was free of lesions.  There was a large submucosal  fibroid filling the lower uterine segment appeared to be attached to the  right posterior aspect of the lower uterine segment.  Just beyond this was a  pedunculated appearing polyp.  There was a large submucosal fibroid at the  fundus.  There was some thickening noted at the left lateral aspect of the  endometrial cavity.  Both tubal ostia were visualized.  The remainder of the  endometrial cavity appeared atrophic, although most of it was taken up by  the above noted lesions.  The resectoscope was placed on routine settings.  The polyp  and lower uterine segment submucosal fibroid were then resected.  An extensive amount of resection was required due to the large lesions  identified.  This took approximately 30% longer than a routine resection.  It was felt that the polyp was the primary source of the patient's bleeding  and that further resection attempts of the fundal fibroid would probably not  prove useful and increase patient's risk of bleeding and complications.  For  this reason, the resectoscope was then removed.  All tissue pieces were  removed and sent to pathology.  The sharp curette was introduced and sharp  curettage paying particular attention to the left aspect of the uterus where  the thickening was visualized - was performed.  Specimens were sent  separately.  All instruments were then removed.  There was no active  bleeding from the cervix.  Net sorbitol intake less than 100 cc.  Estimated  blood loss less than 50 cc.  Attention was then turned to the lesion on the  buttock which had also been prepped with Betadine.  An elliptical incision  using a number 11 blade was then used and the lipoma dissected free of the  underlying subcutaneous tissues.  This was sent to pathology.  The cautery  was then used for surface wall bleeding points.  A layered closure with 4-0  Vicryl was then performed with good approximation of the skin edges.  Dermabond was then applied to the skin edges.  Pressure dressing was applied  as well.  All instruments were then removed.  Counts were correct.  There is  no active bleeding.  The patient received Toradol 30 mg IV and 30 mg IM at  the conclusion of the surgery.  She was stable and extubated on transfer to  the recovery room.  She will be cleared for discharge per anesthesia  protocol.  She is given routine verbal and written discharge instructions. Told to continue all her current medications and take Advil or Aleve as  needed for any cramping.  She is to follow up in the  office in four to six  weeks' time or sooner for any problems.  Laqueta Linden, M.D.    LKS/MEDQ  D:  06/28/2002  T:  06/28/2002  Job:  [id number]

## 2011-02-06 ENCOUNTER — Encounter: Payer: Self-pay | Admitting: Internal Medicine

## 2011-02-06 DIAGNOSIS — I495 Sick sinus syndrome: Secondary | ICD-10-CM

## 2011-03-03 ENCOUNTER — Telehealth: Payer: Self-pay | Admitting: Internal Medicine

## 2011-03-03 MED ORDER — METOPROLOL TARTRATE 25 MG PO TABS: 25.0000 mg | ORAL_TABLET | Freq: Two times a day (BID) | ORAL | Status: DC

## 2011-03-03 NOTE — Telephone Encounter (Signed)
Metoprolol 25mg  refill needed, [facility] [address]  [facility] [place]

## 2011-04-03 ENCOUNTER — Telehealth: Payer: Self-pay | Admitting: Internal Medicine

## 2011-04-03 NOTE — Telephone Encounter (Signed)
Pt needs prodaxa called into [facility] on the [address] in [place] [phone] pt is completely out and needs refill asap

## 2011-04-03 NOTE — Telephone Encounter (Signed)
Called Pradaxa into pharmacy for # 60 with 3 refills.  Judithe Modest, CMA

## 2011-04-30 ENCOUNTER — Telehealth: Payer: Self-pay | Admitting: Internal Medicine

## 2011-04-30 MED ORDER — DABIGATRAN ETEXILATE MESYLATE 150 MG PO CAPS: 150.0000 mg | ORAL_CAPSULE | Freq: Two times a day (BID) | ORAL | Status: DC

## 2011-04-30 NOTE — Telephone Encounter (Signed)
Pt needs refill of Pradaxa called to phar.  Pt has 4 pills left.

## 2011-05-08 ENCOUNTER — Encounter: Payer: Self-pay | Admitting: Internal Medicine

## 2011-05-08 DIAGNOSIS — I495 Sick sinus syndrome: Secondary | ICD-10-CM

## 2011-05-12 ENCOUNTER — Other Ambulatory Visit: Payer: Self-pay | Admitting: Internal Medicine

## 2011-05-27 ENCOUNTER — Telehealth: Payer: Self-pay | Admitting: Internal Medicine

## 2011-05-27 MED ORDER — METOPROLOL TARTRATE 25 MG PO TABS: 25.0000 mg | ORAL_TABLET | Freq: Two times a day (BID) | ORAL | Status: DC

## 2011-05-27 MED ORDER — DABIGATRAN ETEXILATE MESYLATE 150 MG PO CAPS: 150.0000 mg | ORAL_CAPSULE | Freq: Two times a day (BID) | ORAL | Status: DC

## 2011-05-27 NOTE — Telephone Encounter (Signed)
Pt [phone], pradaxa and metoprolol needs to be refilled, [facility], [facility] to say we denied the metoprolol and she wanted to know why, pls call

## 2011-05-29 NOTE — Telephone Encounter (Signed)
Pt was told by [facility] that med was denied, not sure why , needs refills on pradaxa and lopressor

## 2011-06-12 LAB — HEMOGLOBIN A1C: Hgb A1c MFr Bld: 5.2

## 2011-06-12 LAB — BASIC METABOLIC PANEL
BUN: 10
BUN: 12
BUN: 13
BUN: 10
BUN: 10
BUN: 10
CO2: 21
Calcium: 8.6
Calcium: 9.5
Chloride: 108
Chloride: 112
Chloride: 107
Creatinine, Ser: 0.64
Creatinine, Ser: 0.74
Creatinine, Ser: 0.77
Creatinine, Ser: 0.81
Creatinine, Ser: 0.68
Creatinine, Ser: 0.77
GFR calc Af Amer: >60
GFR calc non Af Amer: >60
GFR calc non Af Amer: >60
GFR calc non Af Amer: >60
GFR calc non Af Amer: >60
Glucose, Bld: 102 — ABNORMAL HIGH
Glucose, Bld: 114 — ABNORMAL HIGH
Glucose, Bld: 132 — ABNORMAL HIGH
Glucose, Bld: 104 — ABNORMAL HIGH
Glucose, Bld: 105 — ABNORMAL HIGH
Potassium: 3.8
Potassium: 4.1
Potassium: 4.4
Potassium: 4.1
Sodium: 139

## 2011-06-12 LAB — POCT I-STAT 3, ART BLOOD GAS (G3+)
Acid-base deficit: 4 — ABNORMAL HIGH
Acid-base deficit: 4 — ABNORMAL HIGH
Bicarbonate: 20.2
Bicarbonate: 21
O2 Saturation: 100
O2 Saturation: 99
O2 Saturation: 99
Operator id: 221371
Operator id: 252031
Patient temperature: 37.7
pCO2 arterial: 30.7 — ABNORMAL LOW
pCO2 arterial: 33.7 — ABNORMAL LOW
pCO2 arterial: 36
pH, Arterial: 7.45 — ABNORMAL HIGH
pO2, Arterial: 143 — ABNORMAL HIGH
pO2, Arterial: 153 — ABNORMAL HIGH
pO2, Arterial: 84

## 2011-06-12 LAB — CBC
HCT: 24.2 — ABNORMAL LOW
HCT: 31 — ABNORMAL LOW
HCT: 37.8
HCT: 37.8
Hemoglobin: 8.2 — ABNORMAL LOW
MCHC: 34
MCHC: 34.6
MCHC: 35
MCHC: 34.4
MCV: 88.4
MCV: 88.8
MCV: 89
MCV: 89.2
MCV: 89.6
MCV: 91.1
Platelets: 102 — ABNORMAL LOW
Platelets: 110 — ABNORMAL LOW
Platelets: 133 — ABNORMAL LOW
Platelets: 177
Platelets: 87 — ABNORMAL LOW
Platelets: 98 — ABNORMAL LOW
Platelets: 190
RBC: 4.59
RDW: 13.2
RDW: 13.8
RDW: 13.9
RDW: 14.5
RDW: 15
RDW: 14.3
WBC: 16.4 — ABNORMAL HIGH
WBC: 18.9 — ABNORMAL HIGH
WBC: 9
WBC: 11 — ABNORMAL HIGH

## 2011-06-12 LAB — PROTIME-INR
INR: 0.9
INR: 1.4
INR: 1.7 — ABNORMAL HIGH
INR: 2.8 — ABNORMAL HIGH
Prothrombin Time: 16.1 — ABNORMAL HIGH
Prothrombin Time: 17.2 — ABNORMAL HIGH
Prothrombin Time: 22 — ABNORMAL HIGH
Prothrombin Time: 22.8 — ABNORMAL HIGH

## 2011-06-12 LAB — TYPE AND SCREEN

## 2011-06-12 LAB — URINE CULTURE
Colony Count: NO GROWTH
Culture: NO GROWTH

## 2011-06-12 LAB — POCT I-STAT 4, (NA,K, GLUC, HGB,HCT)
Glucose, Bld: 102 — ABNORMAL HIGH
Glucose, Bld: 72
Glucose, Bld: 82
Glucose, Bld: 89
HCT: 22 — ABNORMAL LOW
HCT: 33 — ABNORMAL LOW
Hemoglobin: 11.2 — ABNORMAL LOW
Hemoglobin: 6.8 — CL
Hemoglobin: 7.5 — CL
Hemoglobin: 8.5 — ABNORMAL LOW
Operator id: 3406
Operator id: 3406
Operator id: 3406
Potassium: 3.4 — ABNORMAL LOW
Potassium: 4.1
Potassium: 5.2 — ABNORMAL HIGH
Potassium: 5.6 — ABNORMAL HIGH
Sodium: 132 — ABNORMAL LOW
Sodium: 137
Sodium: 138
Sodium: 141

## 2011-06-12 LAB — URINE MICROSCOPIC-ADD ON

## 2011-06-12 LAB — HEPATIC FUNCTION PANEL
AST: 23
Bilirubin, Direct: 0.1
Indirect Bilirubin: 0.8

## 2011-06-12 LAB — URINALYSIS, ROUTINE W REFLEX MICROSCOPIC
Bilirubin Urine: NEGATIVE
Bilirubin Urine: NEGATIVE
Glucose, UA: NEGATIVE
Hgb urine dipstick: NEGATIVE
Hgb urine dipstick: NEGATIVE
Hgb urine dipstick: NEGATIVE
Ketones, ur: NEGATIVE
Leukocytes, UA: NEGATIVE
Nitrite: NEGATIVE
Protein, ur: 30 — AB
Protein, ur: NEGATIVE
Specific Gravity, Urine: 1.014
Urobilinogen, UA: 0.2
Urobilinogen, UA: 0.2

## 2011-06-12 LAB — HEMOGLOBIN AND HEMATOCRIT, BLOOD: Hemoglobin: 7.9 — CL

## 2011-06-12 LAB — BLOOD GAS, ARTERIAL
Bicarbonate: 24.7 — ABNORMAL HIGH
TCO2: 25.8
pCO2 arterial: 38.9
pH, Arterial: 7.418 — ABNORMAL HIGH
pO2, Arterial: 93

## 2011-06-12 LAB — CREATININE, SERUM
GFR calc Af Amer: >60
GFR calc non Af Amer: >60

## 2011-06-12 LAB — POCT I-STAT, CHEM 8
BUN: 11
Calcium, Ion: 1.06 — ABNORMAL LOW
Chloride: 109
Glucose, Bld: 141 — ABNORMAL HIGH
TCO2: 21

## 2011-06-12 LAB — POCT I-STAT 3, VENOUS BLOOD GAS (G3P V)
Operator id: 221371
pH, Ven: 7.377 — ABNORMAL HIGH

## 2011-06-12 LAB — MAGNESIUM
Magnesium: 2.9 — ABNORMAL HIGH
Magnesium: 3.5 — ABNORMAL HIGH

## 2011-06-12 LAB — APTT: aPTT: 33

## 2011-06-12 LAB — POCT I-STAT GLUCOSE
Glucose, Bld: 93
Operator id: 3406

## 2011-08-12 ENCOUNTER — Encounter: Payer: Self-pay | Admitting: Internal Medicine

## 2011-08-12 DIAGNOSIS — I495 Sick sinus syndrome: Secondary | ICD-10-CM

## 2011-09-01 ENCOUNTER — Telehealth: Payer: Self-pay | Admitting: Internal Medicine

## 2011-09-01 NOTE — Telephone Encounter (Signed)
[facility], PRADAXA 150MG  REFILL NEEDED

## 2011-09-02 ENCOUNTER — Other Ambulatory Visit: Payer: Self-pay | Admitting: *Deleted

## 2011-09-02 MED ORDER — DABIGATRAN ETEXILATE MESYLATE 150 MG PO CAPS: 150.0000 mg | ORAL_CAPSULE | Freq: Two times a day (BID) | ORAL | Status: DC

## 2011-11-11 ENCOUNTER — Encounter: Payer: Self-pay | Admitting: Internal Medicine

## 2011-11-11 ENCOUNTER — Encounter: Payer: 59 | Admitting: Internal Medicine

## 2011-11-11 DIAGNOSIS — I495 Sick sinus syndrome: Secondary | ICD-10-CM

## 2011-11-21 ENCOUNTER — Encounter: Payer: Self-pay | Admitting: *Deleted

## 2011-12-03 ENCOUNTER — Encounter: Payer: Self-pay | Admitting: Internal Medicine

## 2011-12-03 ENCOUNTER — Ambulatory Visit (INDEPENDENT_AMBULATORY_CARE_PROVIDER_SITE_OTHER): Payer: 59 | Admitting: Internal Medicine

## 2011-12-03 DIAGNOSIS — I4891 Unspecified atrial fibrillation: Secondary | ICD-10-CM

## 2011-12-03 DIAGNOSIS — Z950 Presence of cardiac pacemaker: Secondary | ICD-10-CM

## 2011-12-03 DIAGNOSIS — I421 Obstructive hypertrophic cardiomyopathy: Secondary | ICD-10-CM

## 2011-12-03 DIAGNOSIS — I495 Sick sinus syndrome: Secondary | ICD-10-CM

## 2011-12-03 MED ORDER — DABIGATRAN ETEXILATE MESYLATE 150 MG PO CAPS: 150.0000 mg | ORAL_CAPSULE | Freq: Two times a day (BID) | ORAL | Status: DC

## 2011-12-03 NOTE — Assessment & Plan Note (Signed)
As above.

## 2011-12-03 NOTE — Assessment & Plan Note (Addendum)
The patient's device was interrogated.  The information was reviewed. No changes were made in the programming.   However, we did note that heart rates version is relatively blunted. We'll undertake offic 1 thank you next tinmestress testing

## 2011-12-03 NOTE — Progress Notes (Signed)
  HPI  [name] is a 71 y.o. 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 She has paroxysmal atrial fibrillation and is status post implanted pacemaker. she has underlying hypertrophic myopathy with asymmetric septal hypertrophy  She is doing very well and tolerated Pradaxa without side effects    Past Medical History  Diagnosis Date  . Severe mitral regurgitation   . Asymmetric septal hypertrophy   . Glaucoma   . Atrial fibrillation     with permanent pacemaker  . Current use of long term anticoagulation   . H/O mitral valve repair     Past Surgical History  Procedure Date  . Myomectomy     median sternotomy  . Mitral valve repair      (plication of posterior leaflet with 28-mm Medtronic CG Future  Band annuloplasty). (02/23/08  Dr. Cornelius Moras)  . Appendectomy   . Tubal ligation     Current Outpatient Prescriptions  Medication Sig Dispense Refill  . Calcium Carbonate-Vitamin D (CALCIUM 600+D) 600-400 MG-UNIT per tablet Take 1 tablet by mouth daily.      . cholecalciferol (VITAMIN D) 1000 UNITS tablet 1 TAB EVERY OTHER WEEK      . cycloSPORINE (RESTASIS) 0.05 % ophthalmic emulsion Place 1 drop into both eyes 2 (two) times daily.      . dabigatran (PRADAXA) 150 MG CAPS Take 1 capsule (150 mg total) by mouth every 12 (twelve) hours.  180 capsule  2  . estradiol (VAGIFEM) 25 MCG vaginal tablet Place 25 mcg vaginally 2 (two) times a week.       . fish oil-omega-3 fatty acids 1000 MG capsule Take 1 g by mouth daily.      Marland Kitchen latanoprost (XALATAN) 0.005 % ophthalmic solution Place 1 drop into both eyes at bedtime.      . metoprolol tartrate (LOPRESSOR) 25 MG tablet Take 1 tablet (25 mg total) by mouth 2 (two) times daily.  180 tablet  2  . NON FORMULARY Take 1 tablet by mouth at bedtime as needed. ( OTC sleep medication )      . valACYclovir (VALTREX) 500 MG tablet Take 500 mg by mouth 2 (two) times daily as needed.         No Known Allergies  Review of Systems negative except from HPI and PMH  Physical Exam BP 124/61  Pulse 60  Ht 4\' 11"  (1.499 m)  Wt 117 lb (53.071 kg)  BMI 23.63 kg/m2 Well developed and well nourished in no acute distress HENT normal E scleral and icterus clear Neck Supple Device ocket elll healed JVP flat; carotids brisk and full Clear to ausculation Regular rate and rhythm, no murmurs gallops or rub Soft with active bowel sounds No clubbing cyanosis none Edema Alert and oriented, grossly normal motor and sensory function Skin Warm and Dry   Assessment and  Plan

## 2011-12-03 NOTE — Assessment & Plan Note (Signed)
No significant atrial fibrillation. She is on pradaxa and tolerated without side effects. We will measure her creatinine today

## 2011-12-03 NOTE — Patient Instructions (Signed)
Your physician wants you to follow-up in: 6 months with Dr. Graciela Husbands. You will receive a reminder letter in the mail two months in advance. If you don't receive a letter, please call our office to schedule the follow-up appointment.  Your physician recommends that you have lab work : bmp/cbc  Your physician recommends that you continue on your current medications as directed. Please refer to the Current Medication list given to you today.

## 2011-12-04 LAB — CBC WITH DIFFERENTIAL/PLATELET
Basophils Relative: 1.8 % (ref 0.0–3.0)
Eosinophils Absolute: 0.1 10*3/uL (ref 0.0–0.7)
Eosinophils Relative: 1 % (ref 0.0–5.0)
Hemoglobin: 14.2 g/dL (ref 12.0–15.0)
Lymphocytes Relative: 23.8 % (ref 12.0–46.0)
MCHC: 34.3 g/dL (ref 30.0–36.0)
MCV: 94 fl (ref 78.0–100.0)
Monocytes Absolute: 0.5 10*3/uL (ref 0.1–1.0)
Neutro Abs: 5.9 10*3/uL (ref 1.4–7.7)
RBC: 4.39 Mil/uL (ref 3.87–5.11)
WBC: 8.8 10*3/uL (ref 4.5–10.5)

## 2011-12-04 LAB — BASIC METABOLIC PANEL
CO2: 27 mEq/L (ref 19–32)
Chloride: 104 mEq/L (ref 96–112)
Sodium: 136 mEq/L (ref 135–145)

## 2012-02-09 ENCOUNTER — Other Ambulatory Visit: Payer: Self-pay | Admitting: Internal Medicine

## 2012-05-11 DIAGNOSIS — I495 Sick sinus syndrome: Secondary | ICD-10-CM

## 2012-06-01 ENCOUNTER — Telehealth: Payer: Self-pay | Admitting: Internal Medicine

## 2012-06-01 DIAGNOSIS — I4891 Unspecified atrial fibrillation: Secondary | ICD-10-CM

## 2012-06-01 NOTE — Telephone Encounter (Signed)
New Problem:    Patient called in because she just had her physical with Dr. Elmore Guise and has a few questions she would like to ask.  Please call back.

## 2012-06-01 NOTE — Telephone Encounter (Signed)
I attempted to call the patient. Voice mail not set up. I will call her back.

## 2012-06-02 NOTE — Telephone Encounter (Signed)
I left a message at [phone] to call. I advised if I am still in the office after 6 pm, I will try her alternate number.

## 2012-06-02 NOTE — Telephone Encounter (Signed)
Pt rtn call pls call between 4 and 5 at [phone] , or after 6 at [phone]

## 2012-06-03 NOTE — Telephone Encounter (Signed)
I spoke with the patient. She wanted a copy of her last office note to go to Dr. Chilton Si. She was also under the impression that Dr. Graciela Husbands had mentioned to her that she may be contacted this summer about changing the settings on her PPM. In reading Dr. Odessa Fleming note, I am unclear as to what he is referring to in the dictation in regards to this as the statement did not translate to exactly what he was trying to say. I explained I will clarify with Dr. Graciela Husbands and we will call her back. She is agreeable. Note sent to Dr. Chilton Si.

## 2012-06-04 NOTE — Telephone Encounter (Signed)
The plan was to do stress testing for chronotropic assessment.  plz set up two weeks after i get back thakns

## 2012-06-07 NOTE — Telephone Encounter (Signed)
I left a message with the patient's family member to have her call.

## 2012-06-07 NOTE — Telephone Encounter (Signed)
The patient is aware of Dr. Odessa Fleming recommendations. [name] will set her up for a GXT in 2 weeks.

## 2012-06-22 ENCOUNTER — Encounter: Payer: Self-pay | Admitting: Physician Assistant

## 2012-06-24 ENCOUNTER — Ambulatory Visit (INDEPENDENT_AMBULATORY_CARE_PROVIDER_SITE_OTHER): Payer: 59 | Admitting: *Deleted

## 2012-06-24 ENCOUNTER — Ambulatory Visit (INDEPENDENT_AMBULATORY_CARE_PROVIDER_SITE_OTHER): Payer: 59 | Admitting: Internal Medicine

## 2012-06-24 DIAGNOSIS — I4891 Unspecified atrial fibrillation: Secondary | ICD-10-CM

## 2012-06-24 DIAGNOSIS — R0989 Other specified symptoms and signs involving the circulatory and respiratory systems: Secondary | ICD-10-CM

## 2012-06-24 DIAGNOSIS — I495 Sick sinus syndrome: Secondary | ICD-10-CM

## 2012-06-24 MED ORDER — DABIGATRAN ETEXILATE MESYLATE 150 MG PO CAPS: 150.0000 mg | ORAL_CAPSULE | Freq: Two times a day (BID) | ORAL | Status: DC

## 2012-06-24 MED ORDER — METOPROLOL TARTRATE 25 MG PO TABS: 25.0000 mg | ORAL_TABLET | Freq: Two times a day (BID) | ORAL | Status: DC

## 2012-06-24 NOTE — Progress Notes (Signed)
Patient Care Team: Enrique Sack, MD as PCP - General (Internal Medicine)   HPI  [name] is a 71 y.o. 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 She has paroxysmal atrial fibrillation and is status post implanted pacemaker. she has underlying hypertrophic myopathy with asymmetric septal hypertrophy   She is chronotropic incompetence. She is doing very well and tolerated Pradaxa without side effects    Past Medical History  Diagnosis Date  . Severe mitral regurgitation   . Asymmetric septal hypertrophy   . Glaucoma   . Atrial fibrillation     with permanent pacemaker  . Current use of long term anticoagulation   . H/O mitral valve repair     Past Surgical History  Procedure Date  . Myomectomy     median sternotomy  . Mitral valve repair      (plication of posterior leaflet with 28-mm Medtronic CG Future  Band annuloplasty). (02/23/08  Dr. Cornelius Moras)  . Appendectomy   . Tubal ligation     Current Outpatient Prescriptions  Medication Sig Dispense Refill  . Calcium Carbonate-Vitamin D (CALCIUM 600+D) 600-400 MG-UNIT per tablet Take 1 tablet by mouth daily.      . cholecalciferol (VITAMIN D) 1000 UNITS tablet 1 TAB EVERY OTHER WEEK      . cycloSPORINE (RESTASIS) 0.05 % ophthalmic emulsion Place 1 drop into both eyes 2 (two) times daily.      . dabigatran (PRADAXA) 150 MG CAPS Take 1 capsule (150 mg total) by mouth every 12 (twelve) hours.  180 capsule  3  . estradiol (VAGIFEM) 25 MCG vaginal tablet Place 25 mcg vaginally 2 (two) times a week.       . fish oil-omega-3 fatty acids 1000 MG capsule Take 1 g by mouth daily.      Marland Kitchen latanoprost (XALATAN) 0.005 % ophthalmic solution Place 1 drop into both eyes at bedtime.      . metoprolol tartrate (LOPRESSOR) 25 MG tablet TAKE 1 TABLET TWICE A DAY  180 tablet  1  . NON FORMULARY Take 1 tablet by mouth at bedtime as needed. ( OTC sleep medication )      .  valACYclovir (VALTREX) 500 MG tablet Take 500 mg by mouth 2 (two) times daily as needed.        No Known Allergies  Review of Systems negative except from HPI and PMH

## 2012-06-24 NOTE — Progress Notes (Signed)
Exercise Treadmill Test  Pre-Exercise Testing Evaluation Rhythm: normal sinus  Rate: 60   PR:  .18 QRS:  .13  QT:  .48 QTc: .48           Test  Exercise Tolerance Test Ordering MD: Sherryl Manges, MD  Interpreting MD: Sherryl Manges, MD  Unique Test No: 1  Treadmill:  1  Indication for ETT: A. Fib  Contraindication to ETT: No   Stress Modality: exercise - treadmill  Cardiac Imaging Performed: non   Protocol: standard Bruce - maximal  Max BP: 185/65  Max MPHR (bpm):  95 85% MPR (bpm):  128  MPHR obtained (bpm):  95 % MPHR obtained:  63  Reached 85% MPHR (min:sec):  NA Total Exercise Time (min-sec):  1:45  Workload in METS:  4.60 Borg Scale: 9  Reason ETT Terminated:      ST Segment Analysis At Rest:  With Exercise:   Other Information Arrhythmia:   Angina during ETT:   Quality of ETT:    ETT Interpretation:    Comments: Chronotropic incopetence  Recommendations: Re program device

## 2012-06-25 ENCOUNTER — Telehealth: Payer: Self-pay | Admitting: Internal Medicine

## 2012-06-25 NOTE — Telephone Encounter (Signed)
New Probelm:    Patient called in wanting to know if Dr. Graciela Husbands was ok with her coming back to be seen within three weeks instead of the four that he had recommended.  Please call back and feel free to leave a message.

## 2012-06-25 NOTE — Telephone Encounter (Signed)
Yes. Pt notified.

## 2012-07-02 ENCOUNTER — Encounter: Payer: Self-pay | Admitting: Internal Medicine

## 2012-07-02 DIAGNOSIS — I495 Sick sinus syndrome: Secondary | ICD-10-CM

## 2012-07-02 LAB — PACEMAKER DEVICE OBSERVATION
AL AMPLITUDE: 5 mv
ATRIAL PACING PM: 99
BAMS-0001: 160 {beats}/min
BAMS-0003: 70 {beats}/min
BATTERY VOLTAGE: 2.79 V
RV LEAD THRESHOLD: 0.75 V
VENTRICULAR PACING PM: 1

## 2012-07-02 NOTE — Progress Notes (Signed)
Pacer check in clinic  

## 2012-07-07 ENCOUNTER — Encounter: Payer: Self-pay | Admitting: *Deleted

## 2012-07-13 ENCOUNTER — Ambulatory Visit (INDEPENDENT_AMBULATORY_CARE_PROVIDER_SITE_OTHER): Payer: 59 | Admitting: Internal Medicine

## 2012-07-13 ENCOUNTER — Encounter: Payer: Self-pay | Admitting: Internal Medicine

## 2012-07-13 VITALS — BP 112/64 | Ht 71.5 in | Wt 122.6 lb

## 2012-07-13 DIAGNOSIS — I495 Sick sinus syndrome: Secondary | ICD-10-CM

## 2012-07-13 NOTE — Progress Notes (Signed)
Patient Care Team: Enrique Sack, MD as PCP - General (Internal Medicine)   HPI  [name] is a 71 y.o. female   Seen in followup for a recent treadmill at which time we' reprogrammed as an  her rate response.  she is much improved with improved exercise tolerance   Past Medical History  Diagnosis Date  . Severe mitral regurgitation   . Asymmetric septal hypertrophy   . Glaucoma(365)   . Atrial fibrillation     with permanent pacemaker  . Current use of long term anticoagulation   . H/O mitral valve repair     Past Surgical History  Procedure Date  . Myomectomy     median sternotomy  . Mitral valve repair      (plication of posterior leaflet with 28-mm Medtronic CG Future  Band annuloplasty). (02/23/08  Dr. Cornelius Moras)  . Appendectomy   . Tubal ligation     Current Outpatient Prescriptions  Medication Sig Dispense Refill  . Calcium Carbonate-Vitamin D (CALCIUM 600+D) 600-400 MG-UNIT per tablet Take 1 tablet by mouth daily.      . cholecalciferol (VITAMIN D) 1000 UNITS tablet 1 TAB EVERY OTHER WEEK      . cycloSPORINE (RESTASIS) 0.05 % ophthalmic emulsion Place 1 drop into both eyes 2 (two) times daily.      . dabigatran (PRADAXA) 150 MG CAPS Take 1 capsule (150 mg total) by mouth every 12 (twelve) hours.  180 capsule  3  . estradiol (VAGIFEM) 25 MCG vaginal tablet Place 25 mcg vaginally 2 (two) times a week.       . fish oil-omega-3 fatty acids 1000 MG capsule Take 1 g by mouth daily.      Marland Kitchen latanoprost (XALATAN) 0.005 % ophthalmic solution Place 1 drop into both eyes at bedtime.      . metoprolol tartrate (LOPRESSOR) 25 MG tablet Take 1 tablet (25 mg total) by mouth 2 (two) times daily.  180 tablet  3  . NON FORMULARY Take 1 tablet by mouth at bedtime as needed. ( OTC sleep medication )      . valACYclovir (VALTREX) 500 MG tablet Take 500 mg by mouth 2 (two) times daily as needed.        No Known Allergies  Review of Systems negative except from HPI and PMH  Physical  Exam BP 112/64  Ht 5' 11.5" (1.816 m)  Wt 122 lb 9.6 oz (55.611 kg)  BMI 16.86 kg/m2 Well developed and nourished in no acute distress HENT normal Neck supple Clear Regular rate and rhythm, no murmurs or gallops Abd-soft    No Clubbing cyanosis edema Skin-warm and dry A & Oriented  Grossly normal sensory and motor function    electrocardiogram atrial pacing with intrinsic conduction Intervals 21/13/48   Assessment and  Plan

## 2012-07-13 NOTE — Assessment & Plan Note (Signed)
Reprogramming of her rate response of rhythm has resulted in improved exercise tolerance. We will continue her on her current medications

## 2012-07-13 NOTE — Patient Instructions (Addendum)
Your physician wants you to follow-up in: 1 year with Dr Klein.  You will receive a reminder letter in the mail two months in advance. If you don't receive a letter, please call our office to schedule the follow-up appointment.  

## 2012-09-13 ENCOUNTER — Other Ambulatory Visit: Payer: Self-pay

## 2012-09-13 MED ORDER — METOPROLOL TARTRATE 25 MG PO TABS: 25.0000 mg | ORAL_TABLET | Freq: Two times a day (BID) | ORAL | Status: DC

## 2012-09-24 ENCOUNTER — Telehealth: Payer: Self-pay | Admitting: Internal Medicine

## 2012-09-24 MED ORDER — METOPROLOL TARTRATE 25 MG PO TABS: 25.0000 mg | ORAL_TABLET | Freq: Two times a day (BID) | ORAL | Status: DC

## 2012-09-24 NOTE — Telephone Encounter (Signed)
Pt calling re rx not at [facility] yet, she requested 12-30, [facility], pt needs [facility] called [facility] asap or she wont have enough by the delivery

## 2012-11-16 ENCOUNTER — Encounter: Payer: Self-pay | Admitting: Internal Medicine

## 2012-11-16 DIAGNOSIS — I495 Sick sinus syndrome: Secondary | ICD-10-CM

## 2013-01-25 ENCOUNTER — Other Ambulatory Visit: Payer: Self-pay | Admitting: *Deleted

## 2013-01-25 ENCOUNTER — Telehealth: Payer: Self-pay | Admitting: *Deleted

## 2013-01-25 DIAGNOSIS — I4891 Unspecified atrial fibrillation: Secondary | ICD-10-CM

## 2013-01-25 MED ORDER — DABIGATRAN ETEXILATE MESYLATE 150 MG PO CAPS: 150.0000 mg | ORAL_CAPSULE | Freq: Two times a day (BID) | ORAL | Status: DC

## 2013-01-25 NOTE — Telephone Encounter (Signed)
Patient called to request 30 day supply of [facility]ad[facility]. [facility] r-1 [facility]. Refills completed patient aware

## 2013-02-01 ENCOUNTER — Other Ambulatory Visit: Payer: Self-pay | Admitting: Gastroenterology

## 2013-02-01 DIAGNOSIS — K5730 Diverticulosis of large intestine without perforation or abscess without bleeding: Secondary | ICD-10-CM

## 2013-02-01 DIAGNOSIS — Z1211 Encounter for screening for malignant neoplasm of colon: Secondary | ICD-10-CM

## 2013-02-15 DIAGNOSIS — I495 Sick sinus syndrome: Secondary | ICD-10-CM

## 2013-02-22 ENCOUNTER — Ambulatory Visit
Admission: RE | Admit: 2013-02-22 | Discharge: 2013-02-22 | Disposition: A | Discharge status: Home / Self Care | Payer: Medicare Other | Source: Ambulatory Visit | Attending: Gastroenterology | Admitting: Gastroenterology

## 2013-02-22 DIAGNOSIS — Z1211 Encounter for screening for malignant neoplasm of colon: Secondary | ICD-10-CM

## 2013-02-22 DIAGNOSIS — K5730 Diverticulosis of large intestine without perforation or abscess without bleeding: Secondary | ICD-10-CM

## 2013-03-07 ENCOUNTER — Telehealth: Payer: Self-pay | Admitting: Obstetrics and Gynecology

## 2013-03-07 NOTE — Telephone Encounter (Signed)
Pt would like to speak to nurse regarding a medication change.

## 2013-03-07 NOTE — Telephone Encounter (Signed)
Spoke with pt about discontinuing Vagifem. Pt wasn't sure it was even working, it was expensive, and husband is out of commission with knee problems so they could not afford it. Pt spoke with a friend who has tried estrace cream with success. Pt wondering if that would be a cheaper alternative that would be right for her to try? Please advise.

## 2013-03-07 NOTE — Telephone Encounter (Signed)
Need chart

## 2013-03-08 NOTE — Telephone Encounter (Signed)
Spoke with pt about SM advice. Pt understands. Pt may try an OTC lubricant like Replens, and give the vagifem another try. She will call for a consult visit with CR when she is back in town if needed.

## 2013-03-08 NOTE — Telephone Encounter (Signed)
She is on Pradaxa which is a platelet inhibitor.  This is to decrease risk of stroke.  Patient had open heart surgery 6/09.  There is more estrogen exposure for her body with the estrace or premarin vaginal creams.  I would not recommend her use this because of the increase risk of stroke.  I'm sorry.  Dr. Precious Bard would agree and that is why she has her on the vagifem.

## 2013-03-08 NOTE — Telephone Encounter (Signed)
Patient calling to check on status of request as she is leaving out of town tomorrow.

## 2013-03-08 NOTE — Telephone Encounter (Signed)
LMTCB  aa 

## 2013-03-19 ENCOUNTER — Other Ambulatory Visit: Payer: Self-pay | Admitting: Internal Medicine

## 2013-04-06 ENCOUNTER — Encounter: Payer: Self-pay | Admitting: *Deleted

## 2013-04-14 ENCOUNTER — Telehealth: Payer: Self-pay | Admitting: Internal Medicine

## 2013-04-14 NOTE — Telephone Encounter (Signed)
New prob  Pt would like to speak to someone regarding a letter she received.

## 2013-04-14 NOTE — Telephone Encounter (Signed)
Pt scheduled for 07-05-13 @ 1500 with SK.

## 2013-05-23 ENCOUNTER — Telehealth: Payer: Self-pay | Admitting: Internal Medicine

## 2013-05-23 ENCOUNTER — Other Ambulatory Visit: Payer: Self-pay | Admitting: *Deleted

## 2013-05-23 NOTE — Telephone Encounter (Signed)
New Problem  Pt has called all day and has spoken with each of [place] in scheduling and we have all transferred her to medications// She has not heard a call back since first thing this morning and is need of her refill as she only has day of pradaxa// [facility] [place] that they did not have a record of this RX. The number [phone]. Pt Has had continual problems with pharm and needs the script called in today for her refill.

## 2013-05-24 ENCOUNTER — Other Ambulatory Visit: Payer: Self-pay | Admitting: *Deleted

## 2013-05-24 MED ORDER — DABIGATRAN ETEXILATE MESYLATE 150 MG PO CAPS: 150.0000 mg | ORAL_CAPSULE | Freq: Two times a day (BID) | ORAL | Status: DC

## 2013-05-24 NOTE — Telephone Encounter (Signed)
Month refill sent to [facility] per patient request. Patient has follow up appointment next month with Dr. Graciela Husbands

## 2013-06-15 ENCOUNTER — Other Ambulatory Visit: Payer: Self-pay

## 2013-06-15 DIAGNOSIS — I4891 Unspecified atrial fibrillation: Secondary | ICD-10-CM

## 2013-06-15 MED ORDER — METOPROLOL TARTRATE 25 MG PO TABS: 25.0000 mg | ORAL_TABLET | Freq: Two times a day (BID) | ORAL | Status: DC

## 2013-06-15 MED ORDER — DABIGATRAN ETEXILATE MESYLATE 150 MG PO CAPS: 150.0000 mg | ORAL_CAPSULE | Freq: Two times a day (BID) | ORAL | Status: DC

## 2013-07-05 ENCOUNTER — Encounter: Payer: Self-pay | Admitting: Internal Medicine

## 2013-07-05 ENCOUNTER — Ambulatory Visit (INDEPENDENT_AMBULATORY_CARE_PROVIDER_SITE_OTHER): Payer: Medicare Other | Admitting: Internal Medicine

## 2013-07-05 VITALS — BP 112/51 | HR 82 | Ht 60.0 in | Wt 121.4 lb

## 2013-07-05 DIAGNOSIS — I495 Sick sinus syndrome: Secondary | ICD-10-CM

## 2013-07-05 DIAGNOSIS — I421 Obstructive hypertrophic cardiomyopathy: Secondary | ICD-10-CM

## 2013-07-05 DIAGNOSIS — I4891 Unspecified atrial fibrillation: Secondary | ICD-10-CM

## 2013-07-05 DIAGNOSIS — Z950 Presence of cardiac pacemaker: Secondary | ICD-10-CM

## 2013-07-05 LAB — PACEMAKER DEVICE OBSERVATION
ATRIAL PACING PM: 100
BAMS-0001: 160 {beats}/min
BAMS-0003: 70 {beats}/min
VENTRICULAR PACING PM: 1

## 2013-07-05 NOTE — Assessment & Plan Note (Signed)
99% apacing

## 2013-07-05 NOTE — Patient Instructions (Signed)
Your physician has requested that you have an echocardiogram. Echocardiography is a painless test that uses sound waves to create images of your heart. It provides your doctor with information about the size and shape of your heart and how well your heart's chambers and valves are working. This procedure takes approximately one hour. There are no restrictions for this procedure.  Your physician wants you to follow-up in: one year with Dr. Graciela Husbands.  You will receive a reminder letter in the mail two months in advance. If you don't receive a letter, please call our office to schedule the follow-up appointment.

## 2013-07-05 NOTE — Assessment & Plan Note (Signed)
No significant atrial fibrillation. On anticoagulation

## 2013-07-05 NOTE — Progress Notes (Signed)
      Patient Care Team: Enrique Sack, MD as PCP - General (Internal Medicine)   HPI  [name] is a 72 y.o. 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 She has paroxysmal atrial fibrillation and is status post implanted pacemaker. 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 Severe mitral regurgitation   . Asymmetric septal hypertrophy   . Glaucoma   . Atrial fibrillation     with permanent pacemaker  . Current use of long term anticoagulation   . H/O mitral valve repair     Past Surgical History  Procedure Laterality Date  . Myomectomy      median sternotomy  . Mitral valve repair       (plication of posterior leaflet with 28-mm Medtronic CG Future  Band annuloplasty). (02/23/08  Dr. Cornelius Moras)  . Appendectomy    . Tubal ligation      Current Outpatient Prescriptions  Medication Sig Dispense Refill  . Calcium Carbonate-Vitamin D (CALCIUM 600+D) 600-400 MG-UNIT per tablet Take 1 tablet by mouth daily.      . cholecalciferol (VITAMIN D) 1000 UNITS tablet 1 TAB EVERY OTHER WEEK      . cycloSPORINE (RESTASIS) 0.05 % ophthalmic emulsion Place 1 drop into both eyes 2 (two) times daily.      . dabigatran (PRADAXA) 150 MG CAPS capsule Take 1 capsule (150 mg total) by mouth every 12 (twelve) hours.  60 capsule  0  . fish oil-omega-3 fatty acids 1000 MG capsule Take 1 g by mouth daily.      Marland Kitchen latanoprost (XALATAN) 0.005 % ophthalmic solution Place 1 drop into both eyes at bedtime.      . metoprolol tartrate (LOPRESSOR) 25 MG tablet Take 1 tablet (25 mg total) by mouth 2 (two) times daily.  60 tablet  1  . NON FORMULARY Take 1 tablet by mouth at bedtime as needed. ( OTC sleep  medication )      . valACYclovir (VALTREX) 500 MG tablet Take 500 mg by mouth 2 (two) times daily as needed.       No current facility-administered medications for this visit.    No Known Allergies  Review of Systems negative except from HPI and PMH  Physical Exam BP 112/51  Pulse 82  Ht 5' (1.524 m)  Wt 121 lb 6.4 oz (55.067 kg)  BMI 23.71 kg/m2 Well developed and well nourished in no acute distress HENT normal E scleral and icterus clear Neck Supple JVP flat; carotids brisk and full Clear to ausculation  Regular rate and rhythm, is a 2/6 systolic murmur that increases with her age and a 2/6 diastolic murmur Soft with active bowel sounds Device pocket well healed; without hematoma or erythema.  There is no tethering  No clubbing cyanosis none Edema Alert and oriented, grossly normal motor and sensory function Skin Warm and Dry  Atrial pacing    Assessment and  Plan

## 2013-07-05 NOTE — Assessment & Plan Note (Signed)
We have reviewed again the familial issues related to HCM and screening of her 2 siblings as well as her son She will encourage evaluationa nd followup

## 2013-07-05 NOTE — Assessment & Plan Note (Signed)
The patient's device was interrogated.  The information was reviewed. No changes were made in the programming.    

## 2013-07-07 ENCOUNTER — Telehealth: Payer: Self-pay | Admitting: Internal Medicine

## 2013-07-07 NOTE — Telephone Encounter (Signed)
Spoke with patient about her questions. She stated that she wanted clarification on a few things her and Dr. Graciela Husbands discussed. I will follow up next week when I return with Dr. Graciela Husbands and made patient aware. Patient agreeable to plan.

## 2013-07-07 NOTE — Telephone Encounter (Signed)
Follow up       Please send pt HCM info via e-mail.     [email or web address]    Pt tried to sing up for my chart.

## 2013-07-12 ENCOUNTER — Encounter: Payer: Self-pay | Admitting: Internal Medicine

## 2013-07-20 ENCOUNTER — Ambulatory Visit (HOSPITAL_COMMUNITY): Payer: Medicare Other | Attending: Cardiology | Admitting: Radiology

## 2013-07-20 DIAGNOSIS — I059 Rheumatic mitral valve disease, unspecified: Secondary | ICD-10-CM | POA: Insufficient documentation

## 2013-07-20 DIAGNOSIS — Z950 Presence of cardiac pacemaker: Secondary | ICD-10-CM

## 2013-07-20 DIAGNOSIS — I4891 Unspecified atrial fibrillation: Secondary | ICD-10-CM

## 2013-07-20 DIAGNOSIS — Z87891 Personal history of nicotine dependence: Secondary | ICD-10-CM | POA: Insufficient documentation

## 2013-07-20 DIAGNOSIS — I359 Nonrheumatic aortic valve disorder, unspecified: Secondary | ICD-10-CM

## 2013-07-20 DIAGNOSIS — I495 Sick sinus syndrome: Secondary | ICD-10-CM

## 2013-07-20 NOTE — Telephone Encounter (Signed)
Left message that I would go over this with Dr. Graciela Husbands tomorrow. I apologized that this has not been addressed sooner.

## 2013-07-20 NOTE — Progress Notes (Signed)
Echocardiogram performed.  

## 2013-07-22 ENCOUNTER — Other Ambulatory Visit: Payer: Self-pay | Admitting: *Deleted

## 2013-07-22 DIAGNOSIS — I4891 Unspecified atrial fibrillation: Secondary | ICD-10-CM

## 2013-07-22 MED ORDER — METOPROLOL TARTRATE 25 MG PO TABS: 25.0000 mg | ORAL_TABLET | Freq: Two times a day (BID) | ORAL | Status: DC

## 2013-07-22 MED ORDER — DABIGATRAN ETEXILATE MESYLATE 150 MG PO CAPS: 150.0000 mg | ORAL_CAPSULE | Freq: Two times a day (BID) | ORAL | Status: DC

## 2013-07-22 NOTE — Telephone Encounter (Signed)
Spoke with patient about different websites for HCM. I explained to her that this issue is typically inherited and can be identified by gene testing if family is interested - identifying lets [place] treat it so as to avoid complications by this disease. [facility] information was given to her. I also sent in refills for her Pradaxa and Metoprolol. I sent her an email with information we discussed. Patient verbalized understanding.

## 2013-08-05 ENCOUNTER — Telehealth: Payer: Self-pay | Admitting: Internal Medicine

## 2013-08-05 NOTE — Telephone Encounter (Signed)
New Problem:  Pt states she is calling to hear the results of her echo test.

## 2013-08-09 NOTE — Telephone Encounter (Signed)
Follow Up:  Pt states she is still waiting to hear her echo results. Pt states she will be unavailable until 1pm. Pt is requesting a call back after 1pm today.

## 2013-08-09 NOTE — Telephone Encounter (Signed)
Reviewed results with patient who verbalized understanding. 

## 2013-08-16 ENCOUNTER — Telehealth: Payer: Self-pay | Admitting: Internal Medicine

## 2013-08-16 NOTE — Telephone Encounter (Signed)
Echo results routed to PCP, Dr Chilton Si.

## 2013-08-16 NOTE — Telephone Encounter (Signed)
New Message   Pt called states that she has been Waiting over a month to have ECHO results faxed toDr. Abelardo Diesel office.  Please fax results to   Dr. Abelardo Diesel office  [phone]

## 2013-10-06 ENCOUNTER — Encounter: Payer: Self-pay | Admitting: Internal Medicine

## 2013-10-06 DIAGNOSIS — I495 Sick sinus syndrome: Secondary | ICD-10-CM

## 2013-10-19 ENCOUNTER — Encounter: Payer: Self-pay | Admitting: Obstetrics and Gynecology

## 2013-10-21 ENCOUNTER — Ambulatory Visit (INDEPENDENT_AMBULATORY_CARE_PROVIDER_SITE_OTHER): Payer: Medicare Other | Admitting: Gynecology

## 2013-10-21 ENCOUNTER — Encounter: Payer: Self-pay | Admitting: Gynecology

## 2013-10-21 ENCOUNTER — Ambulatory Visit: Payer: Self-pay | Admitting: Obstetrics and Gynecology

## 2013-10-21 VITALS — BP 120/78 | HR 74 | Resp 16 | Ht 60.0 in | Wt 124.0 lb

## 2013-10-21 DIAGNOSIS — M949 Disorder of cartilage, unspecified: Secondary | ICD-10-CM

## 2013-10-21 DIAGNOSIS — Z01419 Encounter for gynecological examination (general) (routine) without abnormal findings: Secondary | ICD-10-CM

## 2013-10-21 DIAGNOSIS — M899 Disorder of bone, unspecified: Secondary | ICD-10-CM

## 2013-10-21 DIAGNOSIS — M8580 Other specified disorders of bone density and structure, unspecified site: Secondary | ICD-10-CM

## 2013-10-21 NOTE — Patient Instructions (Addendum)

## 2013-10-21 NOTE — Progress Notes (Signed)
73 y.o. Married Caucasian female   G2P1011 here for annual exam. Pt reports menses are absent patient is post menopausal.  She is using lubricants, Replense.  She does not report post-menopasual bleeding.  Pt enjoying retirement.  Pt stopped vagifem due to questionable affects and cost.  Is using replens.  Pt was doing regular exercise.  She was on evista in 2005, and fosamax until 2010, nothing since  No LMP recorded. Patient is postmenopausal.          Sexually active: yes  The current method of family planning is post menopausal status.    Exercising: yes  [facility] Last pap: 02/01/2008 Negative  Abnormal PAP: 11/09/12 Bi-[facility]ads; Scheduled for this february Mammogram: 11/09/12 Bi-Rads 1 BSE: yes Colonoscopy: 2004 Normal DEXA: 10/13 -2.3 Alcohol: 5-7 drinks/wk Tobacco: no  Labs: Levin Erp, MD PCP  Health Maintenance  Topic Date Due  . Zostavax  07/13/2001  . Pneumococcal Polysaccharide Vaccine Age 77 And Over  07/13/2006  . Colonoscopy  09/15/2012  . Influenza Vaccine  04/15/2013  . Mammogram  11/09/2014  . Tetanus/tdap  09/15/2021    Family History  Problem Relation Age of Onset  . Coronary artery disease Brother     n his 75s with triple bypass   . Coronary artery disease Brother     with 2 vessel coronary disease at age 62  . Heart attack Father   . Breast cancer Mother 32  . Breast cancer Maternal Grandmother 20    Patient Active Problem List   Diagnosis Date Noted  . Sinus node dysfunction 12/03/2011  . MITRAL REGURGITATION 12/07/2008  . 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 Atrial fibrillation 12/07/2008  . PACEMAKER-[employer]ude 12/07/2008    Past Medical History  Diagnosis Date  . Severe mitral regurgitation   . Asymmetric septal hypertrophy   . Glaucoma   . Atrial fibrillation     with permanent pacemaker  . Current use of long term anticoagulation   . H/O mitral valve repair   . Endometriosis   . IBS  (irritable bowel syndrome) 12/2002  . 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 Pacemaker 02/2008    inserted, 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  . Irregular heart beat     Past Surgical History  Procedure Laterality Date  . Myomectomy      median sternotomy  . Mitral valve repair       (plication of posterior leaflet with 28-mm Medtronic CG Future  Band annuloplasty). (02/23/08  Dr. Roxy Manns)  . Appendectomy  1952  . Tonsillectomy and adenoidectomy  1955  . Tubal ligation  1983  . Hysteroscopy  06/2002    Polyp; Small fibroid  . Hysteroscopy  09/10/09    Myoma  . Pacemaker insertion  02/2008    Allergies: Review of patient's allergies indicates no known allergies.  Current Outpatient Prescriptions  Medication Sig Dispense Refill  . Calcium Carbonate-Vitamin D (CALCIUM 600+D) 600-400 MG-UNIT per tablet Take 1 tablet by mouth daily.      . cholecalciferol (VITAMIN D) 1000 UNITS tablet 1 TAB EVERY OTHER WEEK      . cycloSPORINE (RESTASIS) 0.05 % ophthalmic emulsion Place 1 drop into both eyes 2 (two) times daily.      . dabigatran (PRADAXA) 150 MG CAPS capsule Take 1 capsule (150 mg total) by mouth every 12 (twelve) hours.  60 capsule  11  . DiphenhydrAMINE HCl (BENADRYL PO)  Take by mouth.      . fish oil-omega-3 fatty acids 1000 MG capsule Take 1 g by mouth daily.      Marland Kitchen latanoprost (XALATAN) 0.005 % ophthalmic solution Place 1 drop into both eyes at bedtime.      . metoprolol tartrate (LOPRESSOR) 25 MG tablet Take 1 tablet (25 mg total) by mouth 2 (two) times daily.  60 tablet  11  . Probiotic Product (PROBIOTIC PO) Take by mouth. Saccharomyces Boulardic MOS      . valACYclovir (VALTREX) 500 MG tablet Take 500 mg by mouth 2 (two) times daily as needed.      . Estradiol (VAGIFEM) 10 MCG TABS vaginal tablet Place vaginally 2 (two) times a week.       No current facility-administered medications for this visit.    ROS: Pertinent items are  noted in HPI.  Exam:    BP 120/78  Resp 16  Ht 5' (1.524 m)  Wt 124 lb (56.246 kg)  BMI 24.22 kg/m2 Weight change: @WEIGHTCHANGE @ Last 3 height recordings:  Ht Readings from Last 3 Encounters:  10/21/13 5' (1.524 m)  07/05/13 5' (1.524 m)  07/13/12 5' 11.5" (1.816 m)   General appearance: alert, cooperative and appears stated age Head: Normocephalic, without obvious abnormality, atraumatic Neck: no adenopathy, no carotid bruit, no JVD, supple, symmetrical, trachea midline and thyroid not enlarged, symmetric, no tenderness/mass/nodules Lungs: clear to auscultation bilaterally Breasts: normal appearance, no masses or tenderness Heart: regular rate and rhythm, S1, S2 normal, no murmur, click, rub or gallop Abdomen: soft, non-tender; bowel sounds normal; no masses,  no organomegaly Extremities: extremities normal, atraumatic, no cyanosis or edema Skin: Skin color, texture, turgor normal. No rashes or lesions Lymph nodes: Cervical, supraclavicular, and axillary nodes normal. no inguinal nodes palpated Neurologic: Grossly normal   Pelvic: External genitalia:  no lesions              Urethra: normal appearing urethra with no masses, tenderness or lesions              Bartholins and Skenes: normal                 Vagina: atrophic              Cervix: normal appearance              Pap taken: no        Bimanual Exam:  Uterus:  uterus is normal size, shape, consistency and nontender                                      Adnexa:    normal adnexa in size, nontender and no masses                                      Rectovaginal: Confirms                                      Anus:  normal sphincter tone, no lesions  A: well woman Contraceptive management     P: mammogram pap smear not done counseled on breast self exam, mammography screening, osteoporosis, adequate intake of calcium and vitamin D, diet and exercise Reviewed all DEXA results from past- BMD's  stable, will repeat  2016 return annually or prn Discussed PAP guideline changes, importance of weight bearing exercises, calcium, vit D and balanced diet.  An After Visit Summary was printed and given to the patient.

## 2013-10-22 LAB — VITAMIN D 25 HYDROXY (VIT D DEFICIENCY, FRACTURES): Vit D, 25-Hydroxy: 54 ng/mL (ref 30–89)

## 2013-10-31 ENCOUNTER — Telehealth: Payer: Self-pay | Admitting: Cardiology

## 2013-10-31 NOTE — Telephone Encounter (Signed)
New problem    Rapid heart beats/ slightly uncomfortable started 2/13 afternoon   O

## 2013-10-31 NOTE — Telephone Encounter (Signed)
Rapid heart rate at times on and off since last Friday. Pt denies SOB, sweats, lightheadedness nauseas with her increased  heart rate episode. Pt has an appointment in the device clinic on 11/10/13 at 3:30 PM . Pt is aware that if she has symptoms with fast heart rate she is to call the ER . Pt. Verbalized understanding.

## 2013-11-08 ENCOUNTER — Other Ambulatory Visit: Payer: Self-pay

## 2013-11-08 ENCOUNTER — Other Ambulatory Visit: Payer: Self-pay | Admitting: Internal Medicine

## 2013-11-08 DIAGNOSIS — I4891 Unspecified atrial fibrillation: Secondary | ICD-10-CM

## 2013-11-08 MED ORDER — DABIGATRAN ETEXILATE MESYLATE 150 MG PO CAPS: 150.0000 mg | ORAL_CAPSULE | Freq: Two times a day (BID) | ORAL | Status: DC

## 2013-11-11 ENCOUNTER — Ambulatory Visit (INDEPENDENT_AMBULATORY_CARE_PROVIDER_SITE_OTHER): Payer: Medicare Other | Admitting: *Deleted

## 2013-11-11 DIAGNOSIS — I4891 Unspecified atrial fibrillation: Secondary | ICD-10-CM

## 2013-11-11 DIAGNOSIS — I495 Sick sinus syndrome: Secondary | ICD-10-CM

## 2013-11-17 LAB — MDC_IDC_ENUM_SESS_TYPE_INCLINIC
Battery Voltage: 2.79 V
Brady Statistic RA Percent Paced: 99 %
Brady Statistic RV Percent Paced: <1
Implantable Pulse Generator Model: 5826
Implantable Pulse Generator Serial Number: 1237757
Lead Channel Pacing Threshold Amplitude: 0.75 V
Lead Channel Pacing Threshold Amplitude: 0.75 V
Lead Channel Pacing Threshold Pulse Width: 0.4 ms
Lead Channel Sensing Intrinsic Amplitude: 12 mV
Lead Channel Setting Pacing Amplitude: 2 V
Lead Channel Setting Pacing Pulse Width: 0.4 ms
Lead Channel Setting Sensing Sensitivity: 2 mV
MDC IDC MSMT BATTERY IMPEDANCE: 1100 Ohm
MDC IDC MSMT LEADCHNL RA IMPEDANCE VALUE: 443 Ohm
MDC IDC MSMT LEADCHNL RA PACING THRESHOLD PULSEWIDTH: 0.4 ms
MDC IDC MSMT LEADCHNL RA SENSING INTR AMPL: 5 mV
MDC IDC MSMT LEADCHNL RV IMPEDANCE VALUE: 340 Ohm
MDC IDC SESS DTM: 20150227195045

## 2013-11-17 NOTE — Progress Notes (Signed)
Pacemaker check in clinic for brief palps. Normal device function. Thresholds, sensing, impedances consistent with previous measurements. Device programmed to maximize longevity. 24 mode switches (<1%)---max dur. 14 sec, Max A 404 (No EGMs)---palps from 2-21 coincide with episodes times listed. No high ventricular rates noted. Retrograde conduction test performed---no VA with VVI 90. Device programmed at appropriate safety margins. Histogram distribution appropriate for patient activity level. Device programmed to optimize intrinsic conduction. Estimated longevity 5.25-7 years. Patient will follow up with [facility] in 3 months and with [facility] in October 2015.

## 2013-11-30 ENCOUNTER — Telehealth: Payer: Self-pay | Admitting: Gynecology

## 2013-11-30 NOTE — Telephone Encounter (Signed)
Notes Recorded by Olga Millers, [place] on 10/24/2013 at 10:55 AM Patient aware to take 600-800 iu's daily per protocol patient aware. ------  Notes Recorded by Azalia Bilis, MD on 10/24/2013 at 7:36 AM Inform vit d excellent, she can use otc, review protocol      Last Vitamind D level 54.   Will Advise 600 mg Daily of Calcium /600-800 daily Vitamin D.   Message left to return call to B[place] at [phone].

## 2013-11-30 NOTE — Telephone Encounter (Signed)
Patient wants to check what amount of vitamin D and calcium she should be taking.

## 2013-12-02 NOTE — Telephone Encounter (Signed)
Spoke with pt to advise on Vit D. Pt takes a One-A-Day vitamin containing 500 mg of calcium and 1000 IU of Vit D. Pt also eats approx 2 servings of cheese and yogurt daily for calcium. Pt was taking a separate Ca and Vit D supplement as well, but will stop it for now, as her Vit D is adequate, and she would like to "take less pills."

## 2013-12-06 DIAGNOSIS — C439 Malignant melanoma of skin, unspecified: Secondary | ICD-10-CM

## 2013-12-06 HISTORY — DX: Malignant melanoma of skin, unspecified: C43.9

## 2013-12-20 ENCOUNTER — Encounter: Payer: Self-pay | Admitting: Internal Medicine

## 2014-01-05 DIAGNOSIS — I495 Sick sinus syndrome: Secondary | ICD-10-CM

## 2014-02-24 ENCOUNTER — Encounter: Payer: Self-pay | Admitting: Internal Medicine

## 2014-03-30 ENCOUNTER — Other Ambulatory Visit: Payer: Self-pay | Admitting: Dermatology

## 2014-04-06 DIAGNOSIS — I495 Sick sinus syndrome: Secondary | ICD-10-CM

## 2014-05-11 ENCOUNTER — Encounter: Payer: Self-pay | Admitting: Internal Medicine

## 2014-06-07 ENCOUNTER — Other Ambulatory Visit: Payer: Self-pay | Admitting: Internal Medicine

## 2014-06-19 ENCOUNTER — Telehealth: Payer: Self-pay | Admitting: Gynecology

## 2014-06-19 ENCOUNTER — Other Ambulatory Visit: Payer: Self-pay | Admitting: Internal Medicine

## 2014-06-19 ENCOUNTER — Ambulatory Visit
Admission: RE | Admit: 2014-06-19 | Discharge: 2014-06-19 | Disposition: A | Discharge status: Home / Self Care | Payer: Medicare Other | Source: Ambulatory Visit | Attending: Internal Medicine | Admitting: Internal Medicine

## 2014-06-19 DIAGNOSIS — R05 Cough: Secondary | ICD-10-CM

## 2014-06-19 DIAGNOSIS — R059 Cough, unspecified: Secondary | ICD-10-CM

## 2014-06-19 NOTE — Telephone Encounter (Signed)
Patient calling requesting an order be sent to [facility] for her bone density scheduled 06/26/14.

## 2014-06-20 NOTE — Telephone Encounter (Signed)
Attempted to reach patient at number provided. Phone rang and rang with no answer. Will try again later.  Order for BMD was sent to [facility] with cover sheet and confirmation.

## 2014-06-20 NOTE — Telephone Encounter (Signed)
Order for BMD outside Dr.Lathrop's door for review and signature.

## 2014-06-27 ENCOUNTER — Other Ambulatory Visit: Payer: Self-pay | Admitting: *Deleted

## 2014-06-27 DIAGNOSIS — I351 Nonrheumatic aortic (valve) insufficiency: Secondary | ICD-10-CM

## 2014-07-03 ENCOUNTER — Other Ambulatory Visit (HOSPITAL_COMMUNITY): Payer: Medicare Other

## 2014-07-05 ENCOUNTER — Ambulatory Visit (HOSPITAL_COMMUNITY): Payer: Medicare Other | Attending: Cardiology | Admitting: Radiology

## 2014-07-05 ENCOUNTER — Telehealth: Payer: Self-pay | Admitting: *Deleted

## 2014-07-05 DIAGNOSIS — I351 Nonrheumatic aortic (valve) insufficiency: Secondary | ICD-10-CM

## 2014-07-05 DIAGNOSIS — I4891 Unspecified atrial fibrillation: Secondary | ICD-10-CM | POA: Diagnosis not present

## 2014-07-05 DIAGNOSIS — I421 Obstructive hypertrophic cardiomyopathy: Secondary | ICD-10-CM

## 2014-07-05 NOTE — Progress Notes (Signed)
Echocardiogram performed.  

## 2014-07-05 NOTE — Telephone Encounter (Signed)
Left Message To Call Back Re: Bone Density Results   Per Dr. Charlies Constable   Reviewed prior studies stable over last 2 years, recommends no change continue vitamin d/calcium repeat in 2017.

## 2014-07-05 NOTE — Telephone Encounter (Signed)
Patient notified of results BMD report sent for scanning.

## 2014-07-07 ENCOUNTER — Ambulatory Visit (INDEPENDENT_AMBULATORY_CARE_PROVIDER_SITE_OTHER): Payer: Medicare Other | Admitting: Internal Medicine

## 2014-07-07 ENCOUNTER — Encounter: Payer: Self-pay | Admitting: Internal Medicine

## 2014-07-07 VITALS — BP 122/54 | HR 60 | Ht 59.5 in | Wt 124.0 lb

## 2014-07-07 DIAGNOSIS — I495 Sick sinus syndrome: Secondary | ICD-10-CM

## 2014-07-07 LAB — MDC_IDC_ENUM_SESS_TYPE_INCLINIC
Brady Statistic RA Percent Paced: 100 %
Brady Statistic RV Percent Paced: 1 %
Date Time Interrogation Session: 20151023125709
Implantable Pulse Generator Model: 5826
Lead Channel Impedance Value: 421 Ohm
Lead Channel Pacing Threshold Amplitude: 0.75 V
Lead Channel Pacing Threshold Pulse Width: 0.4 ms
Lead Channel Sensing Intrinsic Amplitude: 12 mV
Lead Channel Sensing Intrinsic Amplitude: 5 mV
Lead Channel Setting Pacing Amplitude: 2 V
Lead Channel Setting Sensing Sensitivity: 2 mV
MDC IDC MSMT BATTERY IMPEDANCE: 1500 Ohm
MDC IDC MSMT BATTERY VOLTAGE: 2.79 V
MDC IDC MSMT LEADCHNL RV IMPEDANCE VALUE: 323 Ohm
MDC IDC PG SERIAL: 1237757
MDC IDC SET LEADCHNL RV PACING PULSEWIDTH: 0.4 ms

## 2014-07-07 MED ORDER — METOPROLOL TARTRATE 25 MG PO TABS: 25.0000 mg | ORAL_TABLET | Freq: Two times a day (BID) | ORAL | Status: DC

## 2014-07-07 MED ORDER — DABIGATRAN ETEXILATE MESYLATE 150 MG PO CAPS: ORAL_CAPSULE | ORAL | Status: DC

## 2014-07-07 NOTE — Patient Instructions (Signed)
Your physician recommends that you continue on your current medications as directed. Please refer to the Current Medication list given to you today.  Your physician wants you to follow-up in: 1 year with Dr. Klein.  You will receive a reminder letter in the mail two months in advance. If you don't receive a letter, please call our office to schedule the follow-up appointment.  

## 2014-07-07 NOTE — Progress Notes (Signed)
Patient Care Team: Criselda Peaches, MD as PCP - General (Internal Medicine)   HPI  [name] is a 73 y.o. 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   at her last visit we reprogrammed her rate response with a much improved exercise    She is doing very well and tolerated Pradaxa without side effects   Echocardiogram 10/15 demonstrated normal left ventricular function. Early mild focal basal hypertrophy.  Past Medical History  Diagnosis Date  . Severe mitral regurgitation   . Asymmetric septal hypertrophy   . Glaucoma   . Atrial fibrillation     with permanent pacemaker  . Current use of long term anticoagulation   . H/O mitral valve repair   . Endometriosis   . IBS (irritable bowel syndrome) 12/2002  . 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 Pacemaker 02/2008    inserted, 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  . Irregular heart beat     Past Surgical History  Procedure Laterality Date  . Myomectomy      median sternotomy  . Mitral valve repair       (plication of posterior leaflet with 28-mm Medtronic CG Future  Band annuloplasty). (02/23/08  Dr. Roxy Manns)  . Appendectomy  1952  . Tonsillectomy and adenoidectomy  1955  . Tubal ligation  1983  . Hysteroscopy  06/2002    Polyp; Small fibroid  . Hysteroscopy  09/10/09    Myoma  . Pacemaker insertion  02/2008    Current Outpatient Prescriptions  Medication Sig Dispense Refill  . cycloSPORINE (RESTASIS) 0.05 % ophthalmic emulsion Place 1 drop into both eyes 2 (two) times daily.      . DiphenhydrAMINE HCl (BENADRYL PO) Take by mouth.      . fish oil-omega-3 fatty acids 1000 MG capsule Take 1 g by mouth 2 (two) times daily.       Marland Kitchen  latanoprost (XALATAN) 0.005 % ophthalmic solution Place 1 drop into both eyes at bedtime.      . metoprolol tartrate (LOPRESSOR) 25 MG tablet Take 1 tablet (25 mg total) by mouth 2 (two) times daily.  60 tablet  11  . OMEPRAZOLE PO Take 200 mg by mouth daily.      Marland Kitchen PRADAXA 150 MG CAPS capsule TAKE 1 CAPSULE BY MOUTH EVERY 12 HOURS  60 capsule  0  . valACYclovir (VALTREX) 500 MG tablet Take 500 mg by mouth 2 (two) times daily as needed.       No current facility-administered medications for this visit.    No Known Allergies  Review of Systems negative except from HPI and PMH  Physical Exam BP 122/54  Pulse 60  Ht 4' 11.5" (1.511 m)  Wt 124 lb (56.246 kg)  BMI 24.64 kg/m2 Well developed and well nourished in no acute distress HENT normal E scleral and icterus clear Neck Supple JVP flat; carotids brisk and full Clear to ausculation  Regular rate and rhythm, is a 2/6 systolic murmur and 2/6 systolic murmur     Soft with active bowel sounds Device pocket well healed; without hematoma or erythema.  There is no tethering  No clubbing cyanosis none Edema Alert and oriented, grossly normal motor and sensory function Skin  Warm and Dry  Atrial pacing    Assessment and  Plan  Hypertrophic cardiomyopathy status post myectomy  Aortic insufficiency-moderate with normal left ventricular size  Paroxysmal atrial fibrillation   pacemaker-[employer]  Overall she is doing very well. Her minimum symptoms. We will need to keep track of aortic insufficiency but at this point nothing specific needs to be done. We'll continue her on anticoagulation for atrial fibrillation.

## 2014-07-17 ENCOUNTER — Encounter: Payer: Self-pay | Admitting: Internal Medicine

## 2014-07-18 ENCOUNTER — Encounter: Payer: Medicare Other | Admitting: Internal Medicine

## 2014-08-07 ENCOUNTER — Telehealth: Payer: Self-pay | Admitting: Gynecology

## 2014-08-07 NOTE — Telephone Encounter (Signed)
Patient calling requesting to know when she was last prescribed Fosamax? She had a bone density recently and her PCP thinks she should start it again.   [id number] - [place], [place] - [address] [place] [address]

## 2014-08-07 NOTE — Telephone Encounter (Signed)
Returning patient's call -- LMOVM to call me back.

## 2014-08-08 NOTE — Telephone Encounter (Signed)
S/w pt. Pt was put on fosamax on 7/05 and was d/c on 9/10. Pt voiced understanding and will let her PCP know Encounter closed

## 2014-10-03 DIAGNOSIS — R05 Cough: Secondary | ICD-10-CM | POA: Diagnosis not present

## 2014-10-03 DIAGNOSIS — I351 Nonrheumatic aortic (valve) insufficiency: Secondary | ICD-10-CM | POA: Diagnosis not present

## 2014-10-03 DIAGNOSIS — M949 Disorder of cartilage, unspecified: Secondary | ICD-10-CM | POA: Diagnosis not present

## 2014-10-04 NOTE — Telephone Encounter (Signed)
BMD was done in October 2015 and patient will be seeing Evalee Mutton on 10/21/14 for her annual exam and review of her bone density then.   I will close the encounter.

## 2014-10-04 NOTE — Telephone Encounter (Signed)
Please review. Has this been resolved?

## 2014-10-09 DIAGNOSIS — Z950 Presence of cardiac pacemaker: Secondary | ICD-10-CM | POA: Diagnosis not present

## 2014-10-09 DIAGNOSIS — I495 Sick sinus syndrome: Secondary | ICD-10-CM | POA: Diagnosis not present

## 2014-10-23 ENCOUNTER — Encounter: Payer: Self-pay | Admitting: Certified Nurse Midwife

## 2014-10-23 ENCOUNTER — Ambulatory Visit (INDEPENDENT_AMBULATORY_CARE_PROVIDER_SITE_OTHER): Payer: Medicare Other | Admitting: Certified Nurse Midwife

## 2014-10-23 ENCOUNTER — Ambulatory Visit: Payer: Medicare Other | Admitting: Gynecology

## 2014-10-23 VITALS — BP 122/70 | HR 70 | Resp 16 | Ht 59.25 in | Wt 123.0 lb

## 2014-10-23 DIAGNOSIS — Z01419 Encounter for gynecological examination (general) (routine) without abnormal findings: Secondary | ICD-10-CM

## 2014-10-23 DIAGNOSIS — N951 Menopausal and female climacteric states: Secondary | ICD-10-CM

## 2014-10-23 NOTE — Progress Notes (Signed)
74 y.o. G69P1011 Married  Caucasian Fe here for annual exam. Menopausal no HRT. Denies vaginal bleeding. Uses Replens for vaginal dryness with some success. Patient sees PCP for aex, labs, and medication management, sees Cardiology for atrial fib. Management. Patient feels she was bitten by tick several weeks ago and has not completely healed. Feel all removed intact. Has been applying some of her herbal cream, but not totally resolved. Please check. No other health issues today.  No LMP recorded. Patient is postmenopausal.          Sexually active: Yes.    The current method of family planning is tubal ligation.    Exercising: Yes.    aerobics Smoker:  no  Health Maintenance: Pap:  01-22-08 neg MMG:  11-11-13 category a density,birads 1:neg Colonoscopy:  2015 neg per patient BMD:   2015 managed by PCP who told her osteoporosis, TDaP:  2013 Labs: none Self breast exam: done occ   reports that she has quit smoking. She does not have any smokeless tobacco history on file. She reports that she drinks about 3.0 - 3.6 oz of alcohol per week. She reports that she does not use illicit drugs.  Past Medical History  Diagnosis Date  . Severe mitral regurgitation   . Asymmetric septal hypertrophy   . Glaucoma   . Atrial fibrillation     with permanent pacemaker  . Current use of long term anticoagulation   . H/O mitral valve repair   . Endometriosis   . IBS (irritable bowel syndrome) 12/2002  . 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 Pacemaker 02/2008    inserted, 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  . Irregular heart beat     Past Surgical History  Procedure Laterality Date  . Myomectomy      median sternotomy  . Mitral valve repair       (plication of posterior leaflet with 28-mm Medtronic CG Future  Band annuloplasty). (02/23/08  Dr. Roxy Manns)  . Appendectomy  1952  . Tonsillectomy and adenoidectomy  1955  . Tubal ligation  1983  . Hysteroscopy   06/2002    Polyp; Small fibroid  . Hysteroscopy  09/10/09    Myoma  . Pacemaker insertion  02/2008    Current Outpatient Prescriptions  Medication Sig Dispense Refill  . ACETAMINOPHEN PO Take 500 mg by mouth. Takes 2 3 times daily    . Calcium Carbonate-Vitamin D (CALCIUM + D PO) Take by mouth daily.    . cycloSPORINE (RESTASIS) 0.05 % ophthalmic emulsion Place 1 drop into both eyes 2 (two) times daily.    . dabigatran (PRADAXA) 150 MG CAPS capsule TAKE 1 CAPSULE BY MOUTH EVERY 12 HOURS 180 capsule 3  . DiphenhydrAMINE HCl (BENADRYL PO) Take by mouth.    . fish oil-omega-3 fatty acids 1000 MG capsule Take 1 g by mouth 2 (two) times daily.     . IBUPROFEN PO Take 200 mg by mouth. Takes 2 3 times daily    . latanoprost (XALATAN) 0.005 % ophthalmic solution Place 1 drop into both eyes at bedtime.    . metoprolol tartrate (LOPRESSOR) 25 MG tablet Take 1 tablet (25 mg total) by mouth 2 (two) times daily. 180 tablet 3  . OMEPRAZOLE PO Take 200 mg by mouth daily.    . valACYclovir (VALTREX) 500 MG tablet Take 500 mg by mouth 2 (two) times daily as needed.     No current facility-administered  medications for this visit.    Family History  Problem Relation Age of Onset  . Coronary artery disease Brother     n his 29s with triple bypass   . Coronary artery disease Brother     with 2 vessel coronary disease at age 38  . Heart attack Father   . Breast cancer Mother 35  . Breast cancer Maternal Grandmother 50    ROS:  Pertinent items are noted in HPI.  Otherwise, a comprehensive ROS was negative.  Exam:   BP 122/70 mmHg  Pulse 70  Resp 16  Ht 4' 11.25" (1.505 m)  Wt 123 lb (55.792 kg)  BMI 24.63 kg/m2  LMP  Height: 4' 11.25" (150.5 cm) Ht Readings from Last 3 Encounters:  10/23/14 4' 11.25" (1.505 m)  07/07/14 4' 11.5" (1.511 m)  10/21/13 5' (1.524 m)    General appearance: alert, cooperative and appears stated age Head: Normocephalic, without obvious abnormality,  atraumatic Neck: no adenopathy, supple, symmetrical, trachea midline and thyroid normal to inspection and palpation Lungs: clear to auscultation bilaterally Breasts: normal appearance, no masses or tenderness, No nipple retraction or dimpling, No nipple discharge or bleeding, No axillary or supraclavicular adenopathy. Small increase pink area, resembles bite under right axillary area, no exudate or pus, slight scaling, non tender,,not raised. Heart: regular rate and rhythm Abdomen: soft, non-tender; no masses,  no organomegaly Extremities: extremities normal, atraumatic, no cyanosis or edema Skin: Skin color, texture, turgor normal. No rashes or lesions Lymph nodes: Cervical, supraclavicular, and axillary nodes normal. No abnormal inguinal nodes palpated Neurologic: Grossly normal   Pelvic: External genitalia:  no lesions              Urethra:  normal appearing urethra with no masses, tenderness or lesions              Bartholin's and Skene's: normal                 Vagina: normal appearing vagina with normal color and discharge, no lesions              Cervix: normal, non tender, no lesions              Pap taken: No. Bimanual Exam:  Uterus:  normal size, contour, position, consistency, mobility, non-tender and mid position              Adnexa: normal adnexa and no mass, fullness, tenderness               Rectovaginal: Confirms               Anus:  normal sphincter tone, no lesions  Chaperone present: Yes  A:  Well Woman with normal exam  Menopausal no HRT.  Vaginal dryness  Atrial fib with Cardiology management  PCP management with HSV, aex, labs  ? Tick bite healing  P:   Reviewed health and wellness pertinent to exam  Discussed need to evaluate if vaginal bleeding  Discussed Coconut Oil option on daily basis to establish more moisture for trying to re-establish sexual activity. Discussed how to apply to vaginal area and inside vagina and also to use for sexual activity.  Discussed change in position so she is more in control to avoid discomfort. Questions addressed. Will advise if problems.  Continue with MD's as indicated.  Discussed to apply epsom salt soak to area for 3-4 days to see if resolves. She also can apply Bacitracin ointment area in addition to soaks.  If not resolving needs to see Dermatology or PCP. Patient agreeable.  Pap smear not taken today   counseled on breast self exam, mammography screening, adequate intake of calcium and vitamin D, diet and exercise, Kegel's exercises  return annually or prn  An After Visit Summary was printed and given to the patient.

## 2014-10-23 NOTE — Patient Instructions (Signed)

## 2014-10-25 NOTE — Progress Notes (Signed)
Reviewed personally.  M. Suzanne Everardo Voris, MD.  

## 2014-11-13 DIAGNOSIS — Z803 Family history of malignant neoplasm of breast: Secondary | ICD-10-CM | POA: Diagnosis not present

## 2014-11-13 DIAGNOSIS — Z1231 Encounter for screening mammogram for malignant neoplasm of breast: Secondary | ICD-10-CM | POA: Diagnosis not present

## 2015-01-03 ENCOUNTER — Ambulatory Visit (INDEPENDENT_AMBULATORY_CARE_PROVIDER_SITE_OTHER): Payer: Medicare Other | Admitting: *Deleted

## 2015-01-03 DIAGNOSIS — Z950 Presence of cardiac pacemaker: Secondary | ICD-10-CM | POA: Diagnosis not present

## 2015-01-03 DIAGNOSIS — I495 Sick sinus syndrome: Secondary | ICD-10-CM

## 2015-01-03 DIAGNOSIS — I480 Paroxysmal atrial fibrillation: Secondary | ICD-10-CM

## 2015-01-03 LAB — MDC_IDC_ENUM_SESS_TYPE_INCLINIC
Battery Voltage: 2.78 V
Brady Statistic RA Percent Paced: 98 %
Brady Statistic RV Percent Paced: 1 % — CL
Implantable Pulse Generator Model: 5826
Implantable Pulse Generator Serial Number: 1237757
Lead Channel Impedance Value: 431 Ohm
Lead Channel Pacing Threshold Amplitude: 0.75 V
Lead Channel Pacing Threshold Amplitude: 0.75 V
Lead Channel Pacing Threshold Pulse Width: 0.4 ms
Lead Channel Sensing Intrinsic Amplitude: 5 mV
Lead Channel Setting Pacing Amplitude: 2 V
Lead Channel Setting Pacing Pulse Width: 0.4 ms
MDC IDC MSMT BATTERY IMPEDANCE: 1700 Ohm
MDC IDC MSMT LEADCHNL RV IMPEDANCE VALUE: 337 Ohm
MDC IDC MSMT LEADCHNL RV PACING THRESHOLD PULSEWIDTH: 0.4 ms
MDC IDC MSMT LEADCHNL RV SENSING INTR AMPL: 12 mV — AB
MDC IDC SESS DTM: 20160420102552
MDC IDC SET LEADCHNL RV SENSING SENSITIVITY: 2 mV

## 2015-01-03 NOTE — Progress Notes (Signed)
Pacemaker check in clinic. Normal device function. Thresholds, sensing, impedances consistent with previous measurements. Device programmed to maximize longevity. 216 mode switches--- <1%, longest 14 sec + pradaxa. Device programmed at appropriate safety margins. Histogram distribution appropriate for patient activity level. Device programmed to optimize intrinsic conduction. Estimated longevity 3.75-5.67yrs. ROV w/ SK in 3mo.

## 2015-01-15 ENCOUNTER — Encounter: Payer: Self-pay | Admitting: Internal Medicine

## 2015-01-22 DIAGNOSIS — K589 Irritable bowel syndrome without diarrhea: Secondary | ICD-10-CM | POA: Diagnosis not present

## 2015-01-22 DIAGNOSIS — R002 Palpitations: Secondary | ICD-10-CM | POA: Diagnosis not present

## 2015-01-22 DIAGNOSIS — M25569 Pain in unspecified knee: Secondary | ICD-10-CM | POA: Diagnosis not present

## 2015-04-09 DIAGNOSIS — Z950 Presence of cardiac pacemaker: Secondary | ICD-10-CM | POA: Diagnosis not present

## 2015-04-10 DIAGNOSIS — K589 Irritable bowel syndrome without diarrhea: Secondary | ICD-10-CM | POA: Diagnosis not present

## 2015-04-10 DIAGNOSIS — R002 Palpitations: Secondary | ICD-10-CM | POA: Diagnosis not present

## 2015-04-10 DIAGNOSIS — M949 Disorder of cartilage, unspecified: Secondary | ICD-10-CM | POA: Diagnosis not present

## 2015-04-16 ENCOUNTER — Telehealth: Payer: Self-pay | Admitting: Internal Medicine

## 2015-04-16 NOTE — Telephone Encounter (Signed)
New message      Pt has been having AFIB for about 1 week. It has gotten worse. Pt has a pacemaker.  Husband is not sure it pt has been taking her pulse or blood pressure. Please advise

## 2015-04-16 NOTE — Telephone Encounter (Signed)
Spoke with pt who is reporting having recent "uncomfortable heart symptoms" - no chest pain but feels like her heart rate is uneven.  She has been feeling it more than usual since being on new medication her PCP gave her for her knees.  She has stopped this medication and is calling her PCP back for further eval and medication change)  Her HR has been 58 to 100 bpm.  She has a history of At Fib, is on Pradaxa and Lopressor.  Scheduled pt for a pacer check with device clinic on Thursday.  Pt will c/b prior to then if further concerns.

## 2015-04-18 ENCOUNTER — Telehealth: Payer: Self-pay | Admitting: Internal Medicine

## 2015-04-18 ENCOUNTER — Encounter: Payer: Self-pay | Admitting: Internal Medicine

## 2015-04-18 ENCOUNTER — Ambulatory Visit (INDEPENDENT_AMBULATORY_CARE_PROVIDER_SITE_OTHER): Payer: Medicare Other | Admitting: Internal Medicine

## 2015-04-18 ENCOUNTER — Encounter (INDEPENDENT_AMBULATORY_CARE_PROVIDER_SITE_OTHER): Payer: Medicare Other | Admitting: Internal Medicine

## 2015-04-18 VITALS — BP 140/72 | HR 59 | Wt 120.8 lb

## 2015-04-18 DIAGNOSIS — R002 Palpitations: Secondary | ICD-10-CM

## 2015-04-18 DIAGNOSIS — Z950 Presence of cardiac pacemaker: Secondary | ICD-10-CM | POA: Diagnosis not present

## 2015-04-18 DIAGNOSIS — I495 Sick sinus syndrome: Secondary | ICD-10-CM | POA: Diagnosis not present

## 2015-04-18 NOTE — Progress Notes (Signed)
Patient Care Team: Levin Erp, MD as PCP - General (Internal Medicine)   HPI  [name] is a 74 y.o. 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    Because of knee pain last  And IBS her Dr Gerilyn Pilgrim gave her celebrex and dicycloamine.  In the wake of that she started having palpitations feeling her heart beat in her gut as well as some racing   Echocardiogram 10/15 demonstrated normal left ventricular function. Early mild focal basal hypertrophy.  Past Medical History  Diagnosis Date  . Severe mitral regurgitation   . Asymmetric septal hypertrophy   . Glaucoma   . Atrial fibrillation     with permanent pacemaker  . Current use of long term anticoagulation   . H/O mitral valve repair   . Endometriosis   . IBS (irritable bowel syndrome) 12/2002  . 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 Pacemaker 02/2008    inserted, 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  . Irregular heart beat     Past Surgical History  Procedure Laterality Date  . Myomectomy      median sternotomy  . Mitral valve repair       (plication of posterior leaflet with 28-mm Medtronic CG Future  Band annuloplasty). (02/23/08  Dr. Roxy Manns)  . Appendectomy  1952  . Tonsillectomy and adenoidectomy  1955  . Tubal ligation  1983  . Hysteroscopy  06/2002    Polyp; Small fibroid  . Hysteroscopy  09/10/09    Myoma  . Pacemaker insertion  02/2008    Current Outpatient Prescriptions  Medication Sig Dispense Refill  . ACETAMINOPHEN PO Take 500 mg by mouth. Takes 2 3 times daily    . Calcium Carbonate-Vitamin D (CALCIUM + D PO) Take by mouth 2 (two) times daily.     .  cycloSPORINE (RESTASIS) 0.05 % ophthalmic emulsion Place 1 drop into both eyes 2 (two) times daily.    . dabigatran (PRADAXA) 150 MG CAPS capsule TAKE 1 CAPSULE BY MOUTH EVERY 12 HOURS 180 capsule 3  . DiphenhydrAMINE HCl (BENADRYL PO) Take by mouth.    . fish oil-omega-3 fatty acids 1000 MG capsule Take 1 g by mouth 2 (two) times daily.     . IBUPROFEN PO Take 200 mg by mouth. Takes 2 3 times daily    . latanoprost (XALATAN) 0.005 % ophthalmic solution Place 1 drop into both eyes at bedtime.    Marland Kitchen loperamide (IMODIUM A-D) 2 MG tablet Take 2 mg by mouth as needed for diarrhea or loose stools.    . metoprolol tartrate (LOPRESSOR) 25 MG tablet Take 1 tablet (25 mg total) by mouth 2 (two) times daily. 180 tablet 3  . OMEPRAZOLE PO Take 200 mg by mouth as needed.     . valACYclovir (VALTREX) 500 MG tablet Take 500 mg by mouth daily.      No current facility-administered medications for this visit.    No Known Allergies  Review of Systems negative except from HPI and PMH  Physical Exam BP 140/72 mmHg  Pulse 59  Wt 120 lb 12.8 oz (54.795 kg)  SpO2 97% Well developed and well  nourished in no acute distress HENT normal E scleral and icterus clear Neck Supple JVP flat; carotids brisk and full Clear to ausculation  Regular rate and rhythm, is a 2/6 systolic murmur and 2/6 systolic murmur     Soft with active bowel sounds Device pocket well healed; without hematoma or erythema.  There is no tethering  No clubbing cyanosis none Edema Alert and oriented, grossly normal motor and sensory function Skin Warm and Dry  Atrial pacing  Pace maker interrogation demonstrated PVCs as well as symptomatic PACs   There was also atrial ratae smoothing triggered by frequent PACs assoc with AV pacing with long AV delay This was eliminated by turning off Atrial preference pacing   Assessment and  Plan  Hypertrophic cardiomyopathy status post myectomy  Aortic insufficiency-moderate with normal left  ventricular size  Paroxysmal atrial fibrillation   pacemaker-St. Jude  PVCs and PACs  Symptoms seemed to be related to triggered ventricular pacing from PACs and PVCs .  theese seem to have  occur temporally related to the initiation of the aforementioned medications  I have suggested that she stop both of them and that we check K and Mg to see if other triggeers  Reasonable also to consider checking HgB as something changes to trigger the increased ectopy  We spent more than 50% of our >45 min visit in face to face counseling regarding the above

## 2015-04-18 NOTE — Progress Notes (Signed)
error 

## 2015-04-18 NOTE — Telephone Encounter (Signed)
Pt had chest tightness for the past week. Does not really get better at nigth but does not bother as much as it does during the day.  Pt stated that she can tell that her rhythm is uneven and she feels shaky.  Pt stated that in the evening when she has a alcoholic drink and/or resting watching a movie it seems to get better.  Pt went to see her PCP today and noticed irregular pacing spikes and that the pt needs to see her Cardiologist today if possible.  Pt has no c/o of SOB, dizziness, tingling, radiation of pains, or CP.  Pt is leaving for [facility] on Saturday 8/6 for 10 days and would like to be see if possible before her trip.   Pt concerns reviewed with DOD, Caryl Comes, pt will be worked in today on DOD schedule to be seen.  Pt made aware and was on way to office from her PCP.

## 2015-04-18 NOTE — Telephone Encounter (Signed)
New Message  Pt calling for appt w/ Dr. Caryl Comes. Per pt- was told by PCP to FU ASAP w/ Dr. Caryl Comes due to on/off chest TIGHTNESS. Phone call and Note being routed to Triage.

## 2015-04-19 ENCOUNTER — Other Ambulatory Visit (INDEPENDENT_AMBULATORY_CARE_PROVIDER_SITE_OTHER): Payer: Medicare Other

## 2015-04-19 ENCOUNTER — Telehealth: Payer: Self-pay | Admitting: Internal Medicine

## 2015-04-19 ENCOUNTER — Ambulatory Visit (INDEPENDENT_AMBULATORY_CARE_PROVIDER_SITE_OTHER): Payer: Medicare Other | Admitting: *Deleted

## 2015-04-19 DIAGNOSIS — I493 Ventricular premature depolarization: Secondary | ICD-10-CM

## 2015-04-19 LAB — CUP PACEART INCLINIC DEVICE CHECK
Battery Impedance: 1900 Ohm
Battery Voltage: 2.76 V
Battery Voltage: 2.78 V
Brady Statistic RA Percent Paced: 96 %
Date Time Interrogation Session: 20160803203133
Date Time Interrogation Session: 20160804121505
Lead Channel Impedance Value: 347 Ohm
Lead Channel Impedance Value: 318 Ohm
Lead Channel Pacing Threshold Amplitude: 0.75 V
Lead Channel Pacing Threshold Amplitude: 0.875 V
Lead Channel Pacing Threshold Pulse Width: 0.4 ms
Lead Channel Pacing Threshold Pulse Width: 0.4 ms
Lead Channel Setting Pacing Amplitude: 2 V
Lead Channel Setting Pacing Amplitude: 2 V
Lead Channel Setting Pacing Pulse Width: 0.4 ms
Lead Channel Setting Sensing Sensitivity: 2 mV
Lead Channel Setting Sensing Sensitivity: 2 mV
MDC IDC MSMT BATTERY IMPEDANCE: 1800 Ohm
MDC IDC MSMT LEADCHNL RA IMPEDANCE VALUE: 429 Ohm
MDC IDC MSMT LEADCHNL RA IMPEDANCE VALUE: 405 Ohm
MDC IDC MSMT LEADCHNL RA SENSING INTR AMPL: 5 mV — AB
MDC IDC MSMT LEADCHNL RV PACING THRESHOLD AMPLITUDE: 0.875 V
MDC IDC MSMT LEADCHNL RV PACING THRESHOLD PULSEWIDTH: 0.4 ms
MDC IDC MSMT LEADCHNL RV SENSING INTR AMPL: 12 mV — AB
MDC IDC MSMT LEADCHNL RV SENSING INTR AMPL: 12 mV — AB
MDC IDC SET LEADCHNL RV PACING PULSEWIDTH: 0.4 ms
MDC IDC STAT BRADY RA PERCENT PACED: 96 %
MDC IDC STAT BRADY RV PERCENT PACED: 1.3 %
MDC IDC STAT BRADY RV PERCENT PACED: 1.3 %
Pulse Gen Model: 5826
Pulse Gen Model: 5826
Pulse Gen Serial Number: 1237757
Pulse Gen Serial Number: 1237757

## 2015-04-19 LAB — BASIC METABOLIC PANEL
BUN: 23 mg/dL (ref 6–23)
CO2: 28 mEq/L (ref 19–32)
Calcium: 10.6 mg/dL — ABNORMAL HIGH (ref 8.4–10.5)
Chloride: 102 mEq/L (ref 96–112)
Creatinine, Ser: 0.97 mg/dL (ref 0.40–1.20)
GFR: 59.7 mL/min — AB (ref >60.00)
Glucose, Bld: 92 mg/dL (ref 70–99)
POTASSIUM: 4.8 meq/L (ref 3.5–5.1)
Sodium: 138 mEq/L (ref 135–145)

## 2015-04-19 LAB — MAGNESIUM: Magnesium: 2 mg/dL (ref 1.5–2.5)

## 2015-04-19 NOTE — Telephone Encounter (Signed)
Reviewed with device clinic.  Patient will come in today to be seen by device and have lab work done at 11:00 a.m. Pt says thank you and agreeable to plan.

## 2015-04-19 NOTE — Progress Notes (Signed)
Pt c/o of increased malaise since 0500-530 this morning. Presenting rhythm shows more frequent intermittent bigeminal PVCs than yesterday. No programming changes. Pt also had labs prior to interrogation for K & Mg levels. Pt aware lab results not available until tomorrow. Follow up as planned for device, ROV w/ device clinic in 64mo & w/ SK in 87mo.

## 2015-04-19 NOTE — Telephone Encounter (Signed)
New message     Pt was seen yesterday and left late.  She said Dr Caryl Comes wanted her to have labs drawn.  Can she come in today for lab work? AND, pt want [name] to check her pacemaker again today.  She is not "feeling right".  Can she come in for another pacemaker check today?  Please call

## 2015-04-20 ENCOUNTER — Telehealth: Payer: Self-pay | Admitting: Internal Medicine

## 2015-04-20 DIAGNOSIS — I348 Other nonrheumatic mitral valve disorders: Secondary | ICD-10-CM | POA: Diagnosis not present

## 2015-04-20 NOTE — Telephone Encounter (Signed)
New message  ° ° ° °Pt is calling for lab results °

## 2015-04-20 NOTE — Telephone Encounter (Signed)
Follow up     Pt would like you to call Dr Rolly Salter ofc [phone]) with lab results Pt will be at home until 12:10 then will be leaving for appt with Dr Nyoka Cowden

## 2015-04-20 NOTE — Telephone Encounter (Signed)
Follow up      Patient called back to tell Sherri that she needs her lab results before she sees her PCP at noon today.  She is going out of town tomorrow depending on the lab results and want Dr Nyoka Cowden to see them at her visit today

## 2015-04-20 NOTE — Telephone Encounter (Signed)
Informed patient of preliminary lab results. She will have Dr. Rolly Salter office call if they need these results.

## 2015-04-25 ENCOUNTER — Encounter: Payer: Self-pay | Admitting: Internal Medicine

## 2015-04-25 NOTE — Addendum Note (Signed)
Addended by: Freada Bergeron on: 04/25/2015 05:01 PM   Modules accepted: Orders

## 2015-05-03 ENCOUNTER — Telehealth: Payer: Self-pay | Admitting: Internal Medicine

## 2015-05-03 NOTE — Telephone Encounter (Signed)
New message       Pt is back from her trip and is still having same symptoms she was having before trip.  Talk to [facility]

## 2015-05-03 NOTE — Telephone Encounter (Signed)
Patient would like to see Dr. Caryl Comes to discuss what else can be done for her symptomatic PVCs. She is aware he is out of the office until the end of the month.  She is agreeable to scheduler calling her to arrange OV.  She knows that I will discuss this with Caryl Comes when he returns to confirm that office visit appropriate or if alternative telephone call with recommendations would suffice. She thanks me for helping.

## 2015-05-10 DIAGNOSIS — H40053 Ocular hypertension, bilateral: Secondary | ICD-10-CM | POA: Diagnosis not present

## 2015-05-10 DIAGNOSIS — H2513 Age-related nuclear cataract, bilateral: Secondary | ICD-10-CM | POA: Diagnosis not present

## 2015-05-10 DIAGNOSIS — H04123 Dry eye syndrome of bilateral lacrimal glands: Secondary | ICD-10-CM | POA: Diagnosis not present

## 2015-05-14 ENCOUNTER — Encounter: Payer: Self-pay | Admitting: Internal Medicine

## 2015-05-14 ENCOUNTER — Ambulatory Visit (INDEPENDENT_AMBULATORY_CARE_PROVIDER_SITE_OTHER): Payer: Medicare Other | Admitting: Internal Medicine

## 2015-05-14 VITALS — BP 116/68 | HR 70 | Ht 60.0 in | Wt 122.0 lb

## 2015-05-14 DIAGNOSIS — I480 Paroxysmal atrial fibrillation: Secondary | ICD-10-CM | POA: Diagnosis not present

## 2015-05-14 DIAGNOSIS — I495 Sick sinus syndrome: Secondary | ICD-10-CM

## 2015-05-14 DIAGNOSIS — Z950 Presence of cardiac pacemaker: Secondary | ICD-10-CM

## 2015-05-14 LAB — CUP PACEART INCLINIC DEVICE CHECK
Battery Impedance: 1800 Ohm
Date Time Interrogation Session: 20160829130156
Lead Channel Impedance Value: 499 Ohm
Lead Channel Pacing Threshold Amplitude: 0.75 V
Lead Channel Pacing Threshold Amplitude: 1.25 V
Lead Channel Pacing Threshold Pulse Width: 0.4 ms
Lead Channel Sensing Intrinsic Amplitude: 12 mV
Lead Channel Sensing Intrinsic Amplitude: 5 mV
MDC IDC MSMT BATTERY VOLTAGE: 2.76 V
MDC IDC MSMT LEADCHNL RA IMPEDANCE VALUE: 424 Ohm
MDC IDC MSMT LEADCHNL RV PACING THRESHOLD PULSEWIDTH: 0.4 ms
MDC IDC SET LEADCHNL RA PACING AMPLITUDE: 2 V
MDC IDC SET LEADCHNL RV PACING AMPLITUDE: 2.5 V
MDC IDC SET LEADCHNL RV PACING PULSEWIDTH: 0.4 ms
MDC IDC SET LEADCHNL RV SENSING SENSITIVITY: 2 mV
Pulse Gen Model: 5826
Pulse Gen Serial Number: 1237757

## 2015-05-14 NOTE — Progress Notes (Signed)
Patient Care Team: Levin Erp, MD as PCP - General (Internal Medicine)   HPI  [name] is a 74 y.o. 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    She was seen a few weeks ago because of the palpitations as noted. Her device was programmed to activate AV hysteresis. Initial thoughts were that this was ENT; however, the patient has VA dissociation. She notes that her palpitations are almost never apparent exertion.   Echocardiogram 10/15 demonstrated normal left ventricular function. Early mild focal basal hypertrophy.  Past Medical History  Diagnosis Date  . Severe mitral regurgitation   . Asymmetric septal hypertrophy   . Glaucoma   . Atrial fibrillation     with permanent pacemaker  . Current use of long term anticoagulation   . H/O mitral valve repair   . Endometriosis   . IBS (irritable bowel syndrome) 12/2002  . 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 Pacemaker 02/2008    inserted, 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  . Irregular heart beat     Past Surgical History  Procedure Laterality Date  . Myomectomy      median sternotomy  . Mitral valve repair       (plication of posterior leaflet with 28-mm Medtronic CG Future  Band annuloplasty). (02/23/08  Dr. Roxy Manns)  . Appendectomy  1952  . Tonsillectomy and adenoidectomy  1955  . Tubal ligation  1983  . Hysteroscopy  06/2002    Polyp; Small fibroid  . Hysteroscopy  09/10/09    Myoma  . Pacemaker insertion  02/2008    Current Outpatient Prescriptions  Medication Sig Dispense Refill  . ACETAMINOPHEN PO Take 500 mg by mouth daily as needed. Takes 2 3 times daily  for knee pain    . Calcium Carbonate-Vitamin D (CALCIUM + D PO) Take 600 mg by mouth 2 (two) times daily.     . celecoxib (CELEBREX) 200 MG capsule Take 200 mg by mouth daily.  0  . cycloSPORINE (RESTASIS) 0.05 % ophthalmic emulsion Place 1 drop into both eyes 2 (two) times daily.    . dabigatran (PRADAXA) 150 MG CAPS capsule TAKE 1 CAPSULE BY MOUTH EVERY 12 HOURS 180 capsule 3  . dicyclomine (BENTYL) 10 MG capsule Take 10 mg by mouth 2 (two) times daily.  0  . DiphenhydrAMINE HCl (BENADRYL PO) Take 5 mg by mouth daily as needed. For sleep    . latanoprost (XALATAN) 0.005 % ophthalmic solution Place 1 drop into both eyes at bedtime.    . metoprolol tartrate (LOPRESSOR) 25 MG tablet Take 1 tablet (25 mg total) by mouth 2 (two) times daily. 180 tablet 3  . omeprazole (PRILOSEC) 20 MG capsule Take 20 mg by mouth daily.  0  . valACYclovir (VALTREX) 500 MG tablet Take 500 mg by mouth daily.      No current facility-administered medications for this visit.    No Known Allergies  Review of Systems negative except from HPI and PMH  Physical Exam BP 116/68 mmHg  Pulse 70  Ht 5' (1.524 m)  Wt 122 lb (55.339 kg)  BMI 23.83 kg/m2 Well developed and well  nourished in no acute distress HENT normal E scleral and icterus clear Neck Supple JVP flat; carotids brisk and full Clear to ausculation  Regular rate and rhythm, is a 2/6 systolic murmur and 2/6 systolic murmur     Soft with active bowel sounds Device pocket well healed; without hematoma or erythema.  There is no tethering  No clubbing cyanosis none Edema Alert and oriented, grossly normal motor and sensory function Skin Warm and Dry ECG obtained today demonstrates atrial pacing with intrinsic conduction at 71 intervals 19/13/45 PVCs with a right bundle superior axis morphology  Pace maker interrogation demonstrated PVCs as well as symptomatic PACs   There was also atrial ratae smoothing triggered by frequent PACs assoc with AV pacing  with long AV delay This was eliminated by turning off Atrial preference pacing   Assessment and  Plan  Hypertrophic cardiomyopathy status post myectomy  Family has issues have been addressed  Aortic insufficiency-moderate with normal left ventricular size  Paroxysmal atrial fibrillation   pacemaker-St. Jude  PVCs and PACs  She continues with symptomatic discombobulated ambulation. The presumption has been that it is related to a PVCs/PACs or ventricular pacing as a consequence of a pause. While visiting today, she has frequent PVCs of which she complains of few. This raises the issue as to whether the ectopy is in fact problem which I suspect not withstanding the above that is.  We have reprogrammed her device from DDDR--DDIR with the hopes of trying to further minimize and hopefully eliminate ventricular pacing as a potential confounder. In the event that she is able to closely correlate her symptoms with regularity versus irregularity, we will consider antiarrhythmics therapy. Her PVCs are multiple morphologies.  We spent more than 50% of our >25 min visit in face to face counseling regarding the above

## 2015-05-14 NOTE — Patient Instructions (Signed)
Medication Instructions:  Your physician recommends that you continue on your current medications as directed. Please refer to the Current Medication list given to you today.   Labwork: NONE  Testing/Procedures: NONE  Follow-Up: Your physician recommends that you schedule a follow-up appointment in: 6-8 weeks with Dr. Caryl Comes.    Any Other Special Instructions Will Be Listed Below (If Applicable).

## 2015-06-05 ENCOUNTER — Encounter: Payer: Self-pay | Admitting: Internal Medicine

## 2015-06-15 DIAGNOSIS — I351 Nonrheumatic aortic (valve) insufficiency: Secondary | ICD-10-CM | POA: Diagnosis not present

## 2015-06-15 DIAGNOSIS — K589 Irritable bowel syndrome without diarrhea: Secondary | ICD-10-CM | POA: Diagnosis not present

## 2015-06-15 DIAGNOSIS — C439 Malignant melanoma of skin, unspecified: Secondary | ICD-10-CM | POA: Diagnosis not present

## 2015-06-15 DIAGNOSIS — Z0000 Encounter for general adult medical examination without abnormal findings: Secondary | ICD-10-CM | POA: Diagnosis not present

## 2015-06-15 DIAGNOSIS — Z23 Encounter for immunization: Secondary | ICD-10-CM | POA: Diagnosis not present

## 2015-06-15 DIAGNOSIS — D559 Anemia due to enzyme disorder, unspecified: Secondary | ICD-10-CM | POA: Diagnosis not present

## 2015-06-15 DIAGNOSIS — E780 Pure hypercholesterolemia: Secondary | ICD-10-CM | POA: Diagnosis not present

## 2015-06-15 DIAGNOSIS — I348 Other nonrheumatic mitral valve disorders: Secondary | ICD-10-CM | POA: Diagnosis not present

## 2015-07-17 ENCOUNTER — Encounter: Payer: Self-pay | Admitting: Internal Medicine

## 2015-07-17 ENCOUNTER — Ambulatory Visit (INDEPENDENT_AMBULATORY_CARE_PROVIDER_SITE_OTHER): Payer: Medicare Other | Admitting: Internal Medicine

## 2015-07-17 VITALS — BP 120/62 | HR 60 | Ht 60.0 in | Wt 124.4 lb

## 2015-07-17 DIAGNOSIS — Z950 Presence of cardiac pacemaker: Secondary | ICD-10-CM | POA: Diagnosis not present

## 2015-07-17 DIAGNOSIS — I495 Sick sinus syndrome: Secondary | ICD-10-CM

## 2015-07-17 LAB — CUP PACEART INCLINIC DEVICE CHECK
Battery Impedance: 1900 Ohm
Brady Statistic RV Percent Paced: 11 %
Implantable Lead Implant Date: 20090617
Implantable Lead Location: 753859
Lead Channel Impedance Value: 422 Ohm
Lead Channel Impedance Value: 479 Ohm
Lead Channel Sensing Intrinsic Amplitude: 12 mV
Lead Channel Setting Pacing Amplitude: 2.5 V
MDC IDC LEAD IMPLANT DT: 20090617
MDC IDC LEAD LOCATION: 753860
MDC IDC MSMT BATTERY VOLTAGE: 2.79 V
MDC IDC MSMT LEADCHNL RA SENSING INTR AMPL: 4.5 mV
MDC IDC PG SERIAL: 1237757
MDC IDC SESS DTM: 20161101195255
MDC IDC SET LEADCHNL RA PACING AMPLITUDE: 2 V
MDC IDC SET LEADCHNL RV PACING PULSEWIDTH: 0.4 ms
MDC IDC SET LEADCHNL RV SENSING SENSITIVITY: 2 mV
MDC IDC STAT BRADY RA PERCENT PACED: 84 %
Pulse Gen Model: 5826

## 2015-07-17 NOTE — Progress Notes (Signed)
Patient Care Team: Levin Erp, MD as PCP - General (Internal Medicine)   HPI  [name] is a 74 y.o. 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    She was seen a few weeks ago because of the palpitations as noted. Her device was programmed to activate AV hysteresis.  At her last visit, we reprogrammed her device DDDR--DDIR to eliminate ventricular pacing as a potential explanation for her palpitations.   Echocardiogram 10/15 demonstrated normal left ventricular function. Early mild focal basal hypertrophy.  Past Medical History  Diagnosis Date  . Severe mitral regurgitation   . Asymmetric septal hypertrophy (HCC)   . Glaucoma   . Atrial fibrillation ([place])     with permanent pacemaker  . Current use of long term anticoagulation   . H/O mitral valve repair   . Endometriosis   . IBS (irritable bowel syndrome) 12/2002  . 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 Pacemaker 02/2008    inserted, 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  . Irregular heart beat     Past Surgical History  Procedure Laterality Date  . Myomectomy      median sternotomy  . Mitral valve repair       (plication of posterior leaflet with 28-mm Medtronic CG Future  Band annuloplasty). (02/23/08  Dr. Roxy Manns)  . Appendectomy  1952  . Tonsillectomy and adenoidectomy  1955  . Tubal ligation  1983  . Hysteroscopy  06/2002    Polyp; Small fibroid  . Hysteroscopy  09/10/09    Myoma  . Pacemaker insertion  02/2008    Current Outpatient Prescriptions  Medication Sig Dispense Refill  . ACETAMINOPHEN PO Take 1,000 mg by mouth 3 (three) times daily.     . Calcium  Carbonate-Vitamin D (CALCIUM + D PO) Take 600 mg by mouth 2 (two) times daily.     . cycloSPORINE (RESTASIS) 0.05 % ophthalmic emulsion Place 1 drop into both eyes 2 (two) times daily.    . dabigatran (PRADAXA) 150 MG CAPS capsule TAKE 1 CAPSULE BY MOUTH EVERY 12 HOURS 180 capsule 3  . diclofenac sodium (VOLTAREN) 1 % GEL Apply 4 g topically 2 (two) times daily.    . DiphenhydrAMINE HCl (BENADRYL PO) Take 5 mg by mouth daily as needed. For sleep    . latanoprost (XALATAN) 0.005 % ophthalmic solution Place 1 drop into both eyes at bedtime.    Marland Kitchen loperamide (IMODIUM) 2 MG capsule Take 4 mg by mouth 3 (three) times daily as needed for diarrhea or loose stools.    . metoprolol tartrate (LOPRESSOR) 25 MG tablet Take 1 tablet (25 mg total) by mouth 2 (two) times daily. 180 tablet 3  . omeprazole (PRILOSEC) 20 MG capsule Take 20 mg by mouth daily.  0  . valACYclovir (VALTREX) 500 MG tablet Take 500 mg by mouth daily.     Marland Kitchen amoxicillin (AMOXIL) 500 MG tablet Take 2,000 mg by mouth as directed. Take 1 hour before dental procedures     No current facility-administered medications for this visit.    Allergies  Allergen Reactions  . Amiodarone     Severe rash and upset  stomach    Review of Systems negative except from HPI and PMH  Physical Exam BP 120/62 mmHg  Pulse 60  Ht 5' (1.524 m)  Wt 124 lb 6.4 oz (56.427 kg)  BMI 24.30 kg/m2 Well developed and well nourished in no acute distress HENT normal E scleral and icterus clear Neck Supple JVP flat; carotids brisk and full Clear to ausculation  Regular rate and rhythm, is a 2/6 systolic murmur and 2/6 systolic murmur     Soft with active bowel sounds Device pocket well healed; without hematoma or erythema.  There is no tethering  No clubbing cyanosis none Edema Alert and oriented, grossly normal motor and sensory function Skin Warm and Dry ECG obtained today demonstrates atrial pacing with intrinsic conduction at 71 intervals 19/13/45 PVCs  with a right bundle superior axis morphology  Pace maker interrogation demonstrated PVCs as well as symptomatic PACs   There was also atrial ratae smoothing triggered by frequent PACs assoc with AV pacing with long AV delay This was eliminated by turning off Atrial preference pacing   Assessment and  Plan  Hypertrophic cardiomyopathy status post myectomy  Family has issues have been addressed  Aortic insufficiency-moderate with normal left ventricular size  Paroxysmal atrial fibrillation   pacemaker-St. Jude  PVCs and PACs  She continues with symptomatic discombobulated ambulation.  This was reproduced with v pacing we reprogrammed the device to a lower rate of 50 to try to avoid a PVC resulting in ventricular escape pacing prior to sinus rhythm resumption.

## 2015-07-17 NOTE — Patient Instructions (Signed)
Medication Instructions: - no changes  Labwork: - none  Procedures/Testing: - none  Follow-Up: - Your physician recommends that you schedule a follow-up appointment in: 8 weeks with Dr. Caryl Comes.  Any Additional Special Instructions Will Be Listed Below (If Applicable). - none

## 2015-07-18 ENCOUNTER — Telehealth: Payer: Self-pay | Admitting: Internal Medicine

## 2015-07-18 NOTE — Telephone Encounter (Signed)
F/u    Pt calling to give her cell number because she is leaving the house. Waiting for call from nurse in regards to previous message.

## 2015-07-18 NOTE — Telephone Encounter (Signed)
New message   Patient was seen in the office on yesterday  - think the adjustment has made thing worse than better. Next options.

## 2015-07-18 NOTE — Telephone Encounter (Signed)
Patient states that the "thudding" sensation from her pacemaker is gone, but that she has felt shaky since her appointment yesterday.  She compares this sensation to that of "coming down with the flu or something".  She states the shakiness subsided briefly today, but that she is going away this weekend and that she does not want this interfering "with my life".  She denies any symptoms other than shakiness, and denies any acute distress.  Will plan to review symptoms with Dr. Caryl Comes when he is back in the office tomorrow.  Patient aware to call with worsening symptoms, questions, or concerns, and is in agreement with this plan.  Explained that I will call patient back tomorrow afternoon to update her.

## 2015-07-19 ENCOUNTER — Ambulatory Visit (INDEPENDENT_AMBULATORY_CARE_PROVIDER_SITE_OTHER): Payer: Medicare Other | Admitting: *Deleted

## 2015-07-19 DIAGNOSIS — Z950 Presence of cardiac pacemaker: Secondary | ICD-10-CM

## 2015-07-19 DIAGNOSIS — I495 Sick sinus syndrome: Secondary | ICD-10-CM

## 2015-07-19 LAB — CUP PACEART INCLINIC DEVICE CHECK
Date Time Interrogation Session: 20161103181821
Implantable Lead Implant Date: 20090617
Implantable Lead Location: 753859
Lead Channel Pacing Threshold Amplitude: 0.75 V
Lead Channel Pacing Threshold Pulse Width: 0.4 ms
Lead Channel Sensing Intrinsic Amplitude: 5 mV
MDC IDC LEAD IMPLANT DT: 20090617
MDC IDC LEAD LOCATION: 753860
MDC IDC MSMT BATTERY IMPEDANCE: 1900 Ohm
MDC IDC MSMT BATTERY VOLTAGE: 2.78 V
MDC IDC MSMT LEADCHNL RA IMPEDANCE VALUE: 414 Ohm
MDC IDC SET LEADCHNL RA PACING AMPLITUDE: 2 V
MDC IDC STAT BRADY RA PERCENT PACED: 58 %
MDC IDC STAT BRADY RV PERCENT PACED: 1 % — AB
Pulse Gen Model: 5826
Pulse Gen Serial Number: 1237757

## 2015-07-19 NOTE — Telephone Encounter (Signed)
Discussed with Dr. Caryl Comes.  He recommended bringing patient in for reprogramming.  Called patient to let her know, patient agreeable to device clinic appointment at 3:15pm.  She is appreciative of call and denies additional questions or concerns at this time.

## 2015-07-19 NOTE — Progress Notes (Signed)
Add-on pacemaker check in clinic with industry present for symptoms of "shakiness" after reprogramming was done in clinic on 07/17/15 (see PaceArt note). Normal device function. Thresholds, sensing, impedances consistent with previous measurements. Device programmed to maximize longevity. Episode triggers off, no AT/AF episodes, +Pradaxa. Device programmed at appropriate safety margins. Histogram distribution appropriate for patient activity level. Device programmed to optimize intrinsic conduction. Patient symptomatic with PVCs and V-pacing. No V-A conduction during retrograde testing. Reprogrammed mode from DDIR to AAIR, base rate from 50bpm to 70bpm, rest rate to 60bpm. Reprogrammed reaction time from fast to medium, slope from 12 to 10, and max sensor rate from 120bpm to 110bpm; walked patient in office, "shakiness" resolved. SK reviewed, aware of all changes. Estimated longevity 4.5-6.25 years. Patient education completed. ROV with SK on 09/25/15.

## 2015-07-19 NOTE — Telephone Encounter (Signed)
See other phone note from 07/18/15 for details.

## 2015-07-25 ENCOUNTER — Telehealth: Payer: Self-pay | Admitting: Cardiology

## 2015-07-25 DIAGNOSIS — Z85820 Personal history of malignant melanoma of skin: Secondary | ICD-10-CM | POA: Diagnosis not present

## 2015-07-25 DIAGNOSIS — R002 Palpitations: Secondary | ICD-10-CM

## 2015-07-25 DIAGNOSIS — D239 Other benign neoplasm of skin, unspecified: Secondary | ICD-10-CM | POA: Diagnosis not present

## 2015-07-25 DIAGNOSIS — I493 Ventricular premature depolarization: Secondary | ICD-10-CM

## 2015-07-25 NOTE — Telephone Encounter (Addendum)
Patient returned phone call.  Patient states that she "felt great for a few days", but that she had "a really bad night and morning" on 07/25/15.  When asked to describe her symptoms, patient states that she "felt shivery all over, like I was coming down with the flu".  Patient states that "this is the same feeling I had before you made all of those changes to my pacemaker".  Patient further describes feeling like her heart rate was "up" compared to normal the night prior.  She states that now she feels fine, but that she would like to know why her pacemaker was causing her to feel "shivery and cold, like I just couldn't get warm".  Patient denies fever, dizziness, chest discomfort, SOB, or palpitations.  She states that she is not "actually sick", since now she feels fine, but that she felt sick last night.  Patient requesting appointment to have her device reprogrammed.  Patient is unsure if the symptoms she is having are related to her PVCs or atrial fib.  She was not able to check her HR/BP during the episode.  I explained that her pacemaker likely would not cause her to feel "shivery" transiently, but that I would discuss her symptoms with Dr. Caryl Comes via message and when he is back in the office next.  Offered device clinic appointment on Tuesday, 11/15, or Wednesday, 11/16, when Dr. Caryl Comes is in the office, but patient states she will be out of town and she does not want to miss her trip.  Tentatively scheduled patient for device clinic appointment on 08/03/15 at 9:00am pending any additional recommendations from Dr. Caryl Comes.  Advised patient that I will call her next week with Dr. Olin Pia recommendations.  Patient asked if there is another doctor that she could see this week, specifically Dr. Percival Spanish, who she used to see as her primary cardiologist, but she states that she has not seen him "in a few years".  She states that she is concerned about the "shivery" feeling returning.  Advised patient that she should  call our office or proceed to the ED if she has new or worsening symptoms, including chest discomfort, SOB, palpitations, or dizziness, and patient states "I have none of those symptoms, thankfully".  Patient is agreeable to waiting until next week for Dr. Olin Pia recommendations, and is appreciative of return call.  She denies additional questions or concerns at this time.

## 2015-07-25 NOTE — Telephone Encounter (Signed)
Pt wanted to speak w/ you about changes made at last OV she said she was fine for a little while but is not feeling well now. Please call her at [phone]

## 2015-07-25 NOTE — Telephone Encounter (Signed)
[facility] requesting call back.  Will try husband's cell phone number at [phone] as requested.

## 2015-07-25 NOTE — Telephone Encounter (Signed)
No answer on husband's cell phone, patient had requested that I not leave a message on his VM as he is unable to access it.

## 2015-07-26 ENCOUNTER — Other Ambulatory Visit: Payer: Self-pay

## 2015-07-26 DIAGNOSIS — I351 Nonrheumatic aortic (valve) insufficiency: Secondary | ICD-10-CM | POA: Diagnosis not present

## 2015-07-26 DIAGNOSIS — R799 Abnormal finding of blood chemistry, unspecified: Secondary | ICD-10-CM | POA: Diagnosis not present

## 2015-07-26 DIAGNOSIS — E039 Hypothyroidism, unspecified: Secondary | ICD-10-CM | POA: Diagnosis not present

## 2015-07-26 DIAGNOSIS — D559 Anemia due to enzyme disorder, unspecified: Secondary | ICD-10-CM | POA: Diagnosis not present

## 2015-07-26 DIAGNOSIS — K589 Irritable bowel syndrome without diarrhea: Secondary | ICD-10-CM | POA: Diagnosis not present

## 2015-07-26 DIAGNOSIS — I348 Other nonrheumatic mitral valve disorders: Secondary | ICD-10-CM | POA: Diagnosis not present

## 2015-07-26 MED ORDER — DABIGATRAN ETEXILATE MESYLATE 150 MG PO CAPS: ORAL_CAPSULE | ORAL | Status: DC

## 2015-07-26 NOTE — Telephone Encounter (Signed)
Deboraha Sprang, MD at 07/17/2015 3:47 PM  dabigatran (PRADAXA) 150 MG CAPS capsuleTAKE 1 CAPSULE BY MOUTH EVERY 12 HOURS Patient Instructions     Medication Instructions: - no changes

## 2015-07-27 ENCOUNTER — Telehealth: Payer: Self-pay

## 2015-07-27 DIAGNOSIS — I480 Paroxysmal atrial fibrillation: Secondary | ICD-10-CM

## 2015-07-27 MED ORDER — METOPROLOL TARTRATE 25 MG PO TABS: 25.0000 mg | ORAL_TABLET | Freq: Two times a day (BID) | ORAL | Status: DC

## 2015-07-27 NOTE — Telephone Encounter (Signed)
New message       *STAT* If patient is at the pharmacy, call can be transferred to refill team.   1. Which medications need to be refilled? (please list name of each medication and dose if known)  Metoprolol 25mg  2. Which pharmacy/location (including street and city if local pharmacy) is medication to be sent to?   [facility] 3. Do they need a 30 day or 90 day supply? 90 Pt is out of medication

## 2015-08-01 NOTE — Telephone Encounter (Signed)
LMOVM explaining that Dr. Caryl Comes clarified instructions and would like her to have the cardiac event monitor put on and then would like her to follow-up 4 weeks after to discuss results.  Left device clinic phone number to call back to discuss if she has additional questions.

## 2015-08-01 NOTE — Telephone Encounter (Signed)
Patient returned phone call.  Explained Dr. Olin Pia instructions.  Patient is agreeable to having event monitor placed and to following-up with Dr. Caryl Comes on 08/28/15.  Staff message sent to scheduling for event monitor.  She wonders if there is anything she can do or take for her symptoms in the meantime.  She states that exercise helps with symptoms and that she will try to exercise more in the meantime.  Advised that until the cause of her symptoms is identified, Dr. Caryl Comes cannot prescribe medications to help with her symptoms.  Patient voices understanding and appreciation.  Advised patient to call our office with worsening symptoms, questions, or concerns.  Patient verbalizes understanding and denies additional questions at this time.

## 2015-08-01 NOTE — Telephone Encounter (Signed)
Attempted to reach patient at home number, but her husband states that she is out of town.  LMOVM (cell phone) requesting call back per husband's request.

## 2015-08-01 NOTE — Telephone Encounter (Signed)
Patient returned phone call.  Explained to patient that Dr. Caryl Comes cannot rule out a cardiac cause, but that her symptoms are not device-related.  Patient states that she has seen her PCP and has had her blood glucose and her thyroid tested and that all of her blood work has been normal.  She states that she believes that her symptoms are absolutely caused by her pacemaker and that she wants it "fixed".  Explained that her symptoms not being device-related does not mean that they are not cardiac.  Patient voices understanding of this clarification.  Relayed Dr. Olin Pia instructions to schedule an appointment in 3-4 weeks to have a cardiac monitor put on so that her symptom episodes can be monitored more closely.  Patient states that she will "absolutely not wait 4 weeks to be seen" and that she "will have to see someone else if she cannot be seen sooner by Dr. Caryl Comes".  Explained that I will clarify orders with Dr. Caryl Comes and call her back.  Patient requests that I call her cell phone as she is on a trip and states that I can leave a detailed message if she is unable to answer.

## 2015-08-02 ENCOUNTER — Telehealth: Payer: Self-pay | Admitting: *Deleted

## 2015-08-02 NOTE — Telephone Encounter (Signed)
Pt had been contacted by [facility] to schedule monitor, monitor has been scheduled for 08/07/15. Pt asked to speak with a nurse about her symptoms.  I spoke with patient. Pt states she had heart pounding almost all night last night.  Pt states this is similar to past symptoms but worse last night, keeping her awake most of the night.  Pt is asking Dr Caryl Comes for recommendations to help control her symptoms. Pt states her symptoms are uncomfortable and she is having a hard time tolerating them. Pt advised I will forward to Dr Caryl Comes for review.

## 2015-08-02 NOTE — Telephone Encounter (Signed)
Pt advised Dr Caryl Comes not in office until 08/03/15. Pt states Ok for Dr Caryl Comes to review 08/03/15.

## 2015-08-06 NOTE — Telephone Encounter (Signed)
The issue is going to be the likelihood o clarifying the mechanism with a 24-48 hour monitor 30 day recorder. I would favor 30 day recorder unlesS patient is certain it we will seen in 48 hours

## 2015-08-07 ENCOUNTER — Ambulatory Visit (INDEPENDENT_AMBULATORY_CARE_PROVIDER_SITE_OTHER): Payer: Medicare Other

## 2015-08-07 DIAGNOSIS — I493 Ventricular premature depolarization: Secondary | ICD-10-CM | POA: Diagnosis not present

## 2015-08-07 DIAGNOSIS — R002 Palpitations: Secondary | ICD-10-CM

## 2015-08-08 NOTE — Telephone Encounter (Signed)
What was your conversation with the patient- just so I'll know while your out of town.

## 2015-08-17 ENCOUNTER — Encounter: Payer: Self-pay | Admitting: Internal Medicine

## 2015-08-28 ENCOUNTER — Encounter: Payer: Self-pay | Admitting: Internal Medicine

## 2015-08-28 ENCOUNTER — Ambulatory Visit (INDEPENDENT_AMBULATORY_CARE_PROVIDER_SITE_OTHER): Payer: Medicare Other | Admitting: Internal Medicine

## 2015-08-28 VITALS — BP 140/72 | HR 69 | Ht 60.0 in | Wt 125.8 lb

## 2015-08-28 DIAGNOSIS — I480 Paroxysmal atrial fibrillation: Secondary | ICD-10-CM | POA: Diagnosis not present

## 2015-08-28 DIAGNOSIS — I495 Sick sinus syndrome: Secondary | ICD-10-CM | POA: Diagnosis not present

## 2015-08-28 MED ORDER — MAGNESIUM OXIDE 400 MG PO TABS
400.0000 mg | ORAL_TABLET | Freq: Two times a day (BID) | ORAL | Status: DC
Start: 2015-08-28 — End: 2019-08-02

## 2015-08-28 NOTE — Progress Notes (Signed)
Patient Care Team: Levin Erp, MD as PCP - General (Internal Medicine)   HPI  [name] is a 74 y.o. female Seen in follow-up for palpitations. 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    She was seen a few weeks ago because of the palpitations as noted. Her device was programmed to activate AV hysteresis. , we reprogrammed her device DDDR--DDIR to eliminate ventricular pacing as a potential explanation for her palpitations. Most recently reprogrammed her AAIR.  She continued with significant palpitations. She has been given an event recorder to try to help elucidate the mechanism of her symptoms. She is here today to review those results.  Her primary care physician started her on Xanax with a marked improvement in her overall symptoms.    Echocardiogram 10/15 demonstrated normal left ventricular function. Early mild focal basal hypertrophy.  Past Medical History  Diagnosis Date  . Severe mitral regurgitation   . Asymmetric septal hypertrophy (HCC)   . Glaucoma   . Atrial fibrillation ([place])     with permanent pacemaker  . Current use of long term anticoagulation   . H/O mitral valve repair   . Endometriosis   . IBS (irritable bowel syndrome) 12/2002  . 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 Pacemaker 02/2008    inserted, 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  . Irregular heart beat     Past Surgical History  Procedure Laterality Date  . Myomectomy      median sternotomy  . Mitral valve repair       (plication of posterior leaflet with 28-mm Medtronic CG Future  Band annuloplasty). (02/23/08  Dr. Roxy Manns)  . Appendectomy  1952  . Tonsillectomy and adenoidectomy  1955  . Tubal ligation  1983  .  Hysteroscopy  06/2002    Polyp; Small fibroid  . Hysteroscopy  09/10/09    Myoma  . Pacemaker insertion  02/2008    Current Outpatient Prescriptions  Medication Sig Dispense Refill  . ACETAMINOPHEN PO Take 1,000 mg by mouth 3 (three) times daily.     Marland Kitchen ALPRAZolam (XANAX) 0.25 MG tablet Take 0.25 mg by mouth at bedtime as needed for anxiety.    Marland Kitchen amoxicillin (AMOXIL) 500 MG tablet Take 2,000 mg by mouth as directed. Take 1 hour before dental procedures    . Calcium Carbonate-Vitamin D (CALCIUM + D PO) Take 600 mg by mouth 2 (two) times daily.     . cycloSPORINE (RESTASIS) 0.05 % ophthalmic emulsion Place 1 drop into both eyes 2 (two) times daily.    . dabigatran (PRADAXA) 150 MG CAPS capsule TAKE 1 CAPSULE BY MOUTH EVERY 12 HOURS 180 capsule 1  . diclofenac sodium (VOLTAREN) 1 % GEL Apply 4 g topically 2 (two) times daily.    . DiphenhydrAMINE HCl (BENADRYL PO) Take 5 mg by mouth daily as needed. For sleep    . latanoprost (XALATAN) 0.005 % ophthalmic solution Place 1 drop into both eyes at bedtime.    Marland Kitchen loperamide (IMODIUM) 2 MG capsule Take 4 mg by mouth 3 (three) times daily as needed for diarrhea or loose stools.    . metoprolol tartrate (LOPRESSOR) 25 MG tablet Take 1 tablet (25 mg total) by mouth 2 (two) times daily. [place]  tablet 3  . omeprazole (PRILOSEC) 20 MG capsule Take 20 mg by mouth daily.  0  . valACYclovir (VALTREX) 500 MG tablet Take 500 mg by mouth daily.      No current facility-administered medications for this visit.    Allergies  Allergen Reactions  . Amiodarone     Severe rash and upset stomach    Review of Systems negative except from HPI and PMH  Physical Exam BP 140/72 mmHg  Pulse 69  Ht 5' (1.524 m)  Wt 125 lb 12.8 oz (57.063 kg)  BMI 24.57 kg/m2 Well developed and well nourished in no acute distress HENT normal E scleral and icterus clear Neck Supple JVP flat; carotids brisk and full Clear to ausculation  Regular rate and rhythm, is a 2/6 systolic  murmur and 2/6 diastolic murmur     Soft with active bowel sounds Device pocket well healed; without hematoma or erythema.  There is no tethering  No clubbing cyanosis none Edema Alert and oriented, grossly normal motor and sensory function Skin Warm and Dry ECG obtained today demonstrates atrial pacing with intrinsic conduction at 71 intervals 19/13/45 PVCs with a right bundle superior axis morphology  Pace maker interrogation demonstrated PVCs as well as symptomatic PACs   There was also atrial ratae smoothing triggered by frequent PACs assoc with AV pacing with long AV delay This was eliminated by turning off Atrial preference pacing   Assessment and  Plan  Hypertrophic cardiomyopathy status post myectomy  Family has issues have been addressed  Aortic insufficiency-moderate with normal left ventricular size  Paroxysmal atrial fibrillation   pacemaker-St. Jude  PVCs and PACs  She is much improved since was started on xanax;  we have discussed is dependency. She will begin to use it when necessary. In addition, we'll put her on mag oxide 400 twice daily to see if that doesn't help stabilize and decrease her PVC burden.    We spent more than 50% of our >25 min visit in face to face counseling regarding the above

## 2015-08-28 NOTE — Patient Instructions (Signed)
Medication Instructions: 1) Start magnesium oxide 400 mg one tablet by mouth twice daily  Labwork: - none  Procedures/Testing: - none  Follow-Up: - Your physician recommends that you schedule a follow-up appointment in: 3 months with Dr. Caryl Comes.  Any Additional Special Instructions Will Be Listed Below (If Applicable). - none

## 2015-09-25 ENCOUNTER — Encounter: Payer: Medicare Other | Admitting: Internal Medicine

## 2015-09-27 ENCOUNTER — Other Ambulatory Visit: Payer: Self-pay | Admitting: Internal Medicine

## 2015-09-27 MED ORDER — METOPROLOL TARTRATE 25 MG PO TABS: 25.0000 mg | ORAL_TABLET | Freq: Two times a day (BID) | ORAL | Status: DC

## 2015-09-27 MED ORDER — DABIGATRAN ETEXILATE MESYLATE 150 MG PO CAPS: ORAL_CAPSULE | ORAL | Status: DC

## 2015-10-17 DIAGNOSIS — M722 Plantar fascial fibromatosis: Secondary | ICD-10-CM | POA: Diagnosis not present

## 2015-10-23 DIAGNOSIS — M722 Plantar fascial fibromatosis: Secondary | ICD-10-CM | POA: Diagnosis not present

## 2015-10-25 ENCOUNTER — Ambulatory Visit: Payer: Medicare Other | Admitting: Certified Nurse Midwife

## 2015-11-01 DIAGNOSIS — M722 Plantar fascial fibromatosis: Secondary | ICD-10-CM | POA: Diagnosis not present

## 2015-11-07 ENCOUNTER — Telehealth: Payer: Self-pay | Admitting: Internal Medicine

## 2015-11-07 NOTE — Telephone Encounter (Signed)
Juliann Mule is calling from [facility] , because Mr. Davison Pradaxa will not be covered for a refill, and she will be sending a Prior Auth for that.

## 2015-11-14 NOTE — Telephone Encounter (Signed)
Request for change form from [employer] [facility] placed in Dr. Olin Pia box. Spoke with patient and she has a little less than a 90 day supply. Advised we would call her back.

## 2015-11-15 NOTE — Telephone Encounter (Signed)
The patient has a follow up on 11/29/15 with Dr. Caryl Comes. I left a message on her home # that we will address this when she comes in to see him on that date, but to please call if she needs anything sooner.

## 2015-11-19 ENCOUNTER — Telehealth: Payer: Self-pay | Admitting: Internal Medicine

## 2015-11-19 NOTE — Telephone Encounter (Signed)
She has an appt with Dr Caryl Comes 11-29-14. Will they do the pacer check,if so should she cancel her phone check this week?

## 2015-11-19 NOTE — Telephone Encounter (Signed)
LM informing patient that she can cancel her [facility] appt for this week and that we will check her device when she sees Dr.Klein on 3/16. Encouraged patient to call back with questions.

## 2015-11-22 DIAGNOSIS — M722 Plantar fascial fibromatosis: Secondary | ICD-10-CM | POA: Diagnosis not present

## 2015-11-26 DIAGNOSIS — Z1231 Encounter for screening mammogram for malignant neoplasm of breast: Secondary | ICD-10-CM | POA: Diagnosis not present

## 2015-11-26 DIAGNOSIS — Z803 Family history of malignant neoplasm of breast: Secondary | ICD-10-CM | POA: Diagnosis not present

## 2015-11-29 ENCOUNTER — Encounter: Payer: Self-pay | Admitting: Internal Medicine

## 2015-11-29 ENCOUNTER — Ambulatory Visit (INDEPENDENT_AMBULATORY_CARE_PROVIDER_SITE_OTHER): Payer: Medicare Other | Admitting: Internal Medicine

## 2015-11-29 VITALS — BP 124/66 | HR 70 | Ht 60.0 in | Wt 119.8 lb

## 2015-11-29 DIAGNOSIS — I493 Ventricular premature depolarization: Secondary | ICD-10-CM

## 2015-11-29 DIAGNOSIS — I480 Paroxysmal atrial fibrillation: Secondary | ICD-10-CM | POA: Diagnosis not present

## 2015-11-29 DIAGNOSIS — Z45018 Encounter for adjustment and management of other part of cardiac pacemaker: Secondary | ICD-10-CM | POA: Diagnosis not present

## 2015-11-29 NOTE — Patient Instructions (Signed)
Medication Instructions:  Your physician recommends that you continue on your current medications as directed. Please refer to the Current Medication list given to you today.  Labwork: None ordered  Testing/Procedures: None ordered  Follow-Up: Remote monitoring is used to monitor your Pacemaker of ICD from home. This monitoring reduces the number of office visits required to check your device to one time per year. It allows [place] to keep an eye on the functioning of your device to ensure it is working properly. You are scheduled for a device check from home on 02/28/2016. You may send your transmission at any time that day. If you have a wireless device, the transmission will be sent automatically. After your physician reviews your transmission, you will receive a postcard with your next transmission date.  Your physician wants you to follow-up in: 8 months with Chanetta Marshall, NP.  You will receive a reminder letter in the mail two months in advance. If you don't receive a letter, please call our office to schedule the follow-up appointment.  If you need a refill on your cardiac medications before your next appointment, please call your pharmacy.  Thank you for choosing [facility]!!

## 2015-11-29 NOTE — Progress Notes (Signed)
Patient Care Team: Levin Erp, MD as PCP - General (Internal Medicine)   HPI  [name] is a 75 y.o. female Seen in follow-up for palpitations. 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    She was seen a few weeks ago because of the palpitations as noted. Her device was programmed to activate AV hysteresis. , we reprogrammed her device DDDR--DDIR to eliminate ventricular pacing as a potential explanation for her palpitations. Most recently reprogrammed her AAIR.  She continued with significant palpitations. She has been given an event recorder to try to help elucidate the mechanism of her symptoms. >>>PVCs  Her primary care physician started her on Xanax with a marked improvement in her overall symptoms.  She has stopped it. She remains however still improved following initiation concurrently and magnesium   Her insurance company has decided that she can't take Pradaxa anymore  Echocardiogram 10/15 demonstrated normal left ventricular function. Early mild focal basal hypertrophy.  Past Medical History  Diagnosis Date  . Severe mitral regurgitation   . Asymmetric septal hypertrophy (HCC)   . Glaucoma   . Atrial fibrillation ([place])     with permanent pacemaker  . Current use of long term anticoagulation   . H/O mitral valve repair   . Endometriosis   . IBS (irritable bowel syndrome) 12/2002  . 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 Pacemaker 02/2008    inserted, 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  . Irregular heart beat     Past Surgical History  Procedure Laterality Date  . Myomectomy      median sternotomy  . Mitral valve repair       (plication of posterior leaflet with 28-mm Medtronic CG Future   Band annuloplasty). (02/23/08  Dr. Roxy Manns)  . Appendectomy  1952  . Tonsillectomy and adenoidectomy  1955  . Tubal ligation  1983  . Hysteroscopy  06/2002    Polyp; Small fibroid  . Hysteroscopy  09/10/09    Myoma  . Pacemaker insertion  02/2008    Current Outpatient Prescriptions  Medication Sig Dispense Refill  . ACETAMINOPHEN PO Take 1,000 mg by mouth 3 (three) times daily.     Marland Kitchen amoxicillin (AMOXIL) 500 MG tablet Take 2,000 mg by mouth as directed. Take 1 hour before dental procedures    . Calcium Carbonate-Vitamin D (CALCIUM + D PO) Take 600 mg by mouth 2 (two) times daily.     . cycloSPORINE (RESTASIS) 0.05 % ophthalmic emulsion Place 1 drop into both eyes 2 (two) times daily.    . dabigatran (PRADAXA) 150 MG CAPS capsule TAKE 1 CAPSULE BY MOUTH EVERY 12 HOURS 180 capsule 1  . diclofenac sodium (VOLTAREN) 1 % GEL Apply 4 g topically 2 (two) times daily.    Marland Kitchen latanoprost (XALATAN) 0.005 % ophthalmic solution Place 1 drop into both eyes at bedtime.    Marland Kitchen loperamide (IMODIUM) 2 MG capsule Take 4 mg by mouth 3 (three) times daily as needed for diarrhea or loose stools.    . magnesium oxide (MAG-OX) 400 MG tablet Take 1 tablet (400 mg total) by mouth 2 (two) times daily. 30 tablet 6  . Melatonin 3 MG CAPS Take 1 capsule by mouth at bedtime as needed (sleep).    Marland Kitchen  metoprolol tartrate (LOPRESSOR) 25 MG tablet Take 1 tablet (25 mg total) by mouth 2 (two) times daily. 180 tablet 1  . omeprazole (PRILOSEC) 20 MG capsule Take 20 mg by mouth daily.  0  . valACYclovir (VALTREX) 500 MG tablet Take 500 mg by mouth daily.      No current facility-administered medications for this visit.    Allergies  Allergen Reactions  . Amiodarone     Severe rash and upset stomach    Review of Systems negative except from HPI and PMH  Physical Exam BP 124/66 mmHg  Pulse 70  Ht 5' (1.524 m)  Wt 119 lb 12.8 oz (54.341 kg)  BMI 23.40 kg/m2 Well developed and well nourished in no acute distress HENT  normal E scleral and icterus clear Neck Supple JVP flat; carotids brisk and full Clear to ausculation  Regular rate and rhythm, is a 2/6 systolic murmur and 2/6 diastolic murmur     Soft with active bowel sounds Device pocket well healed; without hematoma or erythema.  There is no tethering  No clubbing cyanosis none Edema Alert and oriented, grossly normal motor and sensory function Skin Warm and Dry ECG obtained today demonstrates atrial pacing with intrinsic conduction at 71 intervals 19/13/45 PVCs with a right bundle superior axis morphology  Pace maker interrogation demonstrated PVCs as well as symptomatic PACs   There was also atrial ratae smoothing triggered by frequent PACs assoc with AV pacing with long AV delay This was eliminated by turning off Atrial preference pacing   Assessment and  Plan  Hypertrophic cardiomyopathy status post myectomy  Family has issues have been addressed  Aortic insufficiency-moderate with normal left ventricular size  Paroxysmal atrial fibrillation   pacemaker-St. Jude  PVCs and PACs   She remains much improved even though she has stopped her Xanax. We will continue her on magnesium.  We have discussed alternative NOACs. She will let us know which tier is best

## 2015-11-30 LAB — CUP PACEART INCLINIC DEVICE CHECK
Battery Impedance: 2000 Ohm
Battery Voltage: 2.79 V
Date Time Interrogation Session: 20170316180426
Implantable Lead Implant Date: 20090617
Implantable Lead Implant Date: 20090617
Implantable Lead Location: 753859
Implantable Lead Location: 753860
Lead Channel Impedance Value: 420 Ohm
Lead Channel Pacing Threshold Amplitude: 0.75 V
Lead Channel Pacing Threshold Pulse Width: 0.4 ms
Lead Channel Sensing Intrinsic Amplitude: 5 mV
Lead Channel Setting Pacing Amplitude: 2 V
Pulse Gen Model: 5826
Pulse Gen Serial Number: 1237757

## 2015-12-17 DIAGNOSIS — I499 Cardiac arrhythmia, unspecified: Secondary | ICD-10-CM | POA: Diagnosis not present

## 2015-12-17 DIAGNOSIS — M722 Plantar fascial fibromatosis: Secondary | ICD-10-CM | POA: Diagnosis not present

## 2015-12-17 DIAGNOSIS — M8580 Other specified disorders of bone density and structure, unspecified site: Secondary | ICD-10-CM | POA: Diagnosis not present

## 2015-12-20 ENCOUNTER — Telehealth: Payer: Self-pay | Admitting: *Deleted

## 2015-12-20 NOTE — Telephone Encounter (Signed)
Patient called in stating that she would like to start Eliquis.   Will notify Dr.Klein and Alvis Lemmings, RN of patient's decision.

## 2015-12-24 NOTE — Telephone Encounter (Signed)
plz strt apixoban at 5 mg bid Will need to confirm Cr altohguh was fine last summer

## 2015-12-27 DIAGNOSIS — M1712 Unilateral primary osteoarthritis, left knee: Secondary | ICD-10-CM | POA: Diagnosis not present

## 2015-12-27 DIAGNOSIS — M1711 Unilateral primary osteoarthritis, right knee: Secondary | ICD-10-CM | POA: Diagnosis not present

## 2015-12-27 DIAGNOSIS — M170 Bilateral primary osteoarthritis of knee: Secondary | ICD-10-CM | POA: Diagnosis not present

## 2016-01-02 DIAGNOSIS — M791 Myalgia: Secondary | ICD-10-CM | POA: Diagnosis not present

## 2016-01-02 DIAGNOSIS — R799 Abnormal finding of blood chemistry, unspecified: Secondary | ICD-10-CM | POA: Diagnosis not present

## 2016-01-04 DIAGNOSIS — R799 Abnormal finding of blood chemistry, unspecified: Secondary | ICD-10-CM | POA: Diagnosis not present

## 2016-01-14 DIAGNOSIS — M791 Myalgia: Secondary | ICD-10-CM | POA: Diagnosis not present

## 2016-01-18 ENCOUNTER — Telehealth: Payer: Self-pay | Admitting: Cardiology

## 2016-01-18 ENCOUNTER — Encounter: Payer: Self-pay | Admitting: *Deleted

## 2016-01-18 NOTE — Addendum Note (Signed)
Addended by: Alvis Lemmings C on: 01/18/2016 12:39 PM   Modules accepted: Orders

## 2016-01-18 NOTE — Telephone Encounter (Signed)
Eliquis 5 mg tablets pulled for patient- new start Lot: [id number] Exp: 5/19 # 4 boxes  Letter of instructions placed with samples to take Eliquis 5 mg BID and she will need to repeat her labs in 1 month.

## 2016-01-18 NOTE — Telephone Encounter (Signed)
Pt called and stated that Dr. Caryl Comes said he would give her a month supply of Eliquis. She stated that she would like to come by on Monday (01-21-16) or Tuesday (01-22-16) to pick it up.

## 2016-01-18 NOTE — Telephone Encounter (Signed)
Samples of Eliquis 5 mg pulled for the patient. Lot: [id number] Exp: 01/2018 # 4 boxes  Letter of instructions placed with samples for the patient to take one tablet by mouth twice daily and repeat a BMP/ CBC 1 month after starting.

## 2016-01-29 ENCOUNTER — Telehealth: Payer: Self-pay | Admitting: Internal Medicine

## 2016-01-29 NOTE — Telephone Encounter (Signed)
New message  Pt request a call back to discuss if she will need to order more Eliquis. But she will not know if she will need to take it until after the blood test. So will she need another month sample or will she need to order it.

## 2016-01-29 NOTE — Telephone Encounter (Signed)
I left a message on the patient's answering machine that I will move her lab appointment out to Monday 02/25/16. I have advised her to call back and reschedule this if that date does not work.

## 2016-01-29 NOTE — Telephone Encounter (Signed)
New message      Pt started eliquis on 01-27-16.  Should pt keep her 02-19-16 lab appt or move it out a little?

## 2016-01-29 NOTE — Telephone Encounter (Signed)
I spoke with the patient.  She is aware I will pull a few more samples for her. # 2 boxes of Eliquis 5 mg tablets placed at the front desk for the patient.

## 2016-02-07 ENCOUNTER — Other Ambulatory Visit: Payer: Self-pay | Admitting: Internal Medicine

## 2016-02-07 DIAGNOSIS — I776 Arteritis, unspecified: Secondary | ICD-10-CM | POA: Diagnosis not present

## 2016-02-13 ENCOUNTER — Other Ambulatory Visit: Payer: Self-pay | Admitting: Internal Medicine

## 2016-02-13 DIAGNOSIS — L958 Other vasculitis limited to the skin: Secondary | ICD-10-CM

## 2016-02-13 DIAGNOSIS — L959 Vasculitis limited to the skin, unspecified: Secondary | ICD-10-CM

## 2016-02-15 DIAGNOSIS — M170 Bilateral primary osteoarthritis of knee: Secondary | ICD-10-CM | POA: Diagnosis not present

## 2016-02-18 ENCOUNTER — Ambulatory Visit
Admission: RE | Admit: 2016-02-18 | Discharge: 2016-02-18 | Disposition: A | Discharge status: Home / Self Care | Payer: Medicare Other | Source: Ambulatory Visit | Attending: Internal Medicine | Admitting: Internal Medicine

## 2016-02-18 DIAGNOSIS — L959 Vasculitis limited to the skin, unspecified: Secondary | ICD-10-CM

## 2016-02-18 MED ORDER — IOPAMIDOL (ISOVUE-300) INJECTION 61%
75.0000 mL | Freq: Once | INTRAVENOUS | Status: AC | PRN
  Administered 2016-02-18: 50 mL via INTRAVENOUS

## 2016-02-19 ENCOUNTER — Other Ambulatory Visit: Payer: Medicare Other

## 2016-02-19 DIAGNOSIS — R51 Headache: Secondary | ICD-10-CM | POA: Diagnosis not present

## 2016-02-19 DIAGNOSIS — K588 Other irritable bowel syndrome: Secondary | ICD-10-CM | POA: Diagnosis not present

## 2016-02-25 ENCOUNTER — Other Ambulatory Visit: Payer: Medicare Other

## 2016-02-27 ENCOUNTER — Telehealth: Payer: Self-pay | Admitting: Internal Medicine

## 2016-02-27 NOTE — Telephone Encounter (Signed)
Called pt to inform her that I would be leaving a application for the assistance program for Eliquis and that I was leaving a month supply up at the front desk for pt to pick up. I advised the pt that if she has any other problems, questions or concerns to call the office. Pt verbalized understanding.

## 2016-02-27 NOTE — Telephone Encounter (Signed)
°  Patient calling the office for samples of medication: ° ° °1.  What medication and dosage are you requesting samples for? °Eliquis  °2.  Are you currently out of this medication?  °Yes  ° ° °

## 2016-02-27 NOTE — Telephone Encounter (Signed)
The pt's samples that was left at the front desk for pt to pick up was Eliquis 5 mg lot# [id number], Exp# 01/2018.

## 2016-02-28 ENCOUNTER — Telehealth: Payer: Self-pay | Admitting: *Deleted

## 2016-02-28 ENCOUNTER — Other Ambulatory Visit (INDEPENDENT_AMBULATORY_CARE_PROVIDER_SITE_OTHER): Payer: Medicare Other | Admitting: *Deleted

## 2016-02-28 ENCOUNTER — Encounter: Payer: Medicare Other | Admitting: *Deleted

## 2016-02-28 DIAGNOSIS — I480 Paroxysmal atrial fibrillation: Secondary | ICD-10-CM | POA: Diagnosis not present

## 2016-02-28 LAB — CBC WITH DIFFERENTIAL/PLATELET
BASOS PCT: 0 %
Basophils Absolute: 0 cells/uL (ref 0–200)
EOS PCT: 0 %
Eosinophils Absolute: 0 cells/uL — ABNORMAL LOW (ref 15–500)
HCT: 39.7 % (ref 35.0–45.0)
HEMOGLOBIN: 13.3 g/dL (ref 11.7–15.5)
LYMPHS ABS: 826 {cells}/uL — AB (ref 850–3900)
LYMPHS PCT: 7 %
MCH: 32.8 pg (ref 27.0–33.0)
MCHC: 33.5 g/dL (ref 32.0–36.0)
MCV: 97.8 fL (ref 80.0–100.0)
MONO ABS: 354 {cells}/uL (ref 200–950)
MPV: 10.4 fL (ref 7.5–12.5)
Monocytes Relative: 3 %
NEUTROS PCT: 90 %
Neutro Abs: 10620 cells/uL — ABNORMAL HIGH (ref 1500–7800)
Platelets: 264 10*3/uL (ref 140–400)
RBC: 4.06 MIL/uL (ref 3.80–5.10)
RDW: 14.7 % (ref 11.0–15.0)
WBC: 11.8 10*3/uL — AB (ref 3.8–10.8)

## 2016-02-28 NOTE — Telephone Encounter (Signed)
Eliquis samples placed at the front desk on 01/29/16 put back in sample closet.

## 2016-02-29 LAB — BASIC METABOLIC PANEL
BUN: 28 mg/dL — ABNORMAL HIGH (ref 7–25)
CALCIUM: 9.9 mg/dL (ref 8.6–10.4)
CO2: 25 mmol/L (ref 20–31)
Chloride: 105 mmol/L (ref 98–110)
Creat: 0.92 mg/dL (ref 0.60–0.93)
GLUCOSE: 106 mg/dL — AB (ref 65–99)
POTASSIUM: 4.6 mmol/L (ref 3.5–5.3)
SODIUM: 137 mmol/L (ref 135–146)

## 2016-03-21 DIAGNOSIS — K588 Other irritable bowel syndrome: Secondary | ICD-10-CM | POA: Diagnosis not present

## 2016-03-24 ENCOUNTER — Telehealth: Payer: Self-pay | Admitting: *Deleted

## 2016-03-24 ENCOUNTER — Other Ambulatory Visit: Payer: Self-pay | Admitting: *Deleted

## 2016-03-24 ENCOUNTER — Telehealth: Payer: Self-pay

## 2016-03-24 MED ORDER — APIXABAN 5 MG PO TABS: 5.0000 mg | ORAL_TABLET | Freq: Two times a day (BID) | ORAL | Status: DC

## 2016-03-24 NOTE — Telephone Encounter (Signed)
PA obtained from [facility].

## 2016-03-24 NOTE — Telephone Encounter (Signed)
Prior auth obtained for Eliquis 5 mg. PA- [id number]. Pharmacy notified.

## 2016-03-24 NOTE — Telephone Encounter (Signed)
Patient stated that a PA is required for the eliquis.

## 2016-03-24 NOTE — Telephone Encounter (Signed)
Patient notified

## 2016-04-08 ENCOUNTER — Other Ambulatory Visit: Payer: Self-pay | Admitting: Dermatology

## 2016-04-08 ENCOUNTER — Encounter: Payer: Self-pay | Admitting: Internal Medicine

## 2016-04-08 DIAGNOSIS — Z950 Presence of cardiac pacemaker: Secondary | ICD-10-CM | POA: Diagnosis not present

## 2016-04-08 DIAGNOSIS — Z85820 Personal history of malignant melanoma of skin: Secondary | ICD-10-CM | POA: Diagnosis not present

## 2016-04-08 DIAGNOSIS — D239 Other benign neoplasm of skin, unspecified: Secondary | ICD-10-CM | POA: Diagnosis not present

## 2016-04-08 DIAGNOSIS — D224 Melanocytic nevi of scalp and neck: Secondary | ICD-10-CM | POA: Diagnosis not present

## 2016-04-08 DIAGNOSIS — D485 Neoplasm of uncertain behavior of skin: Secondary | ICD-10-CM | POA: Diagnosis not present

## 2016-04-08 DIAGNOSIS — I495 Sick sinus syndrome: Secondary | ICD-10-CM | POA: Diagnosis not present

## 2016-04-21 DIAGNOSIS — K588 Other irritable bowel syndrome: Secondary | ICD-10-CM | POA: Diagnosis not present

## 2016-05-09 DIAGNOSIS — K588 Other irritable bowel syndrome: Secondary | ICD-10-CM | POA: Diagnosis not present

## 2016-05-21 DIAGNOSIS — H16223 Keratoconjunctivitis sicca, not specified as Sjogren's, bilateral: Secondary | ICD-10-CM | POA: Diagnosis not present

## 2016-05-21 DIAGNOSIS — H2513 Age-related nuclear cataract, bilateral: Secondary | ICD-10-CM | POA: Diagnosis not present

## 2016-05-21 DIAGNOSIS — H40053 Ocular hypertension, bilateral: Secondary | ICD-10-CM | POA: Diagnosis not present

## 2016-05-29 DIAGNOSIS — H40053 Ocular hypertension, bilateral: Secondary | ICD-10-CM | POA: Diagnosis not present

## 2016-05-29 DIAGNOSIS — H16223 Keratoconjunctivitis sicca, not specified as Sjogren's, bilateral: Secondary | ICD-10-CM | POA: Diagnosis not present

## 2016-05-29 DIAGNOSIS — H2513 Age-related nuclear cataract, bilateral: Secondary | ICD-10-CM | POA: Diagnosis not present

## 2016-06-12 NOTE — Progress Notes (Signed)
This encounter was created in error - please disregard.

## 2016-06-19 DIAGNOSIS — K588 Other irritable bowel syndrome: Secondary | ICD-10-CM | POA: Diagnosis not present

## 2016-06-19 DIAGNOSIS — R799 Abnormal finding of blood chemistry, unspecified: Secondary | ICD-10-CM | POA: Diagnosis not present

## 2016-06-19 DIAGNOSIS — Z23 Encounter for immunization: Secondary | ICD-10-CM | POA: Diagnosis not present

## 2016-06-19 DIAGNOSIS — I351 Nonrheumatic aortic (valve) insufficiency: Secondary | ICD-10-CM | POA: Diagnosis not present

## 2016-06-19 DIAGNOSIS — Z0000 Encounter for general adult medical examination without abnormal findings: Secondary | ICD-10-CM | POA: Diagnosis not present

## 2016-06-19 DIAGNOSIS — E7800 Pure hypercholesterolemia, unspecified: Secondary | ICD-10-CM | POA: Diagnosis not present

## 2016-06-19 DIAGNOSIS — D559 Anemia due to enzyme disorder, unspecified: Secondary | ICD-10-CM | POA: Diagnosis not present

## 2016-06-19 DIAGNOSIS — E039 Hypothyroidism, unspecified: Secondary | ICD-10-CM | POA: Diagnosis not present

## 2016-06-27 ENCOUNTER — Other Ambulatory Visit (HOSPITAL_COMMUNITY): Payer: Self-pay | Admitting: Internal Medicine

## 2016-06-27 DIAGNOSIS — R079 Chest pain, unspecified: Secondary | ICD-10-CM

## 2016-07-07 ENCOUNTER — Ambulatory Visit (HOSPITAL_COMMUNITY)
Admission: RE | Admit: 2016-07-07 | Discharge: 2016-07-07 | Disposition: A | Discharge status: Home / Self Care | Payer: Medicare Other | Source: Ambulatory Visit | Attending: Internal Medicine | Admitting: Internal Medicine

## 2016-07-07 DIAGNOSIS — R079 Chest pain, unspecified: Secondary | ICD-10-CM | POA: Diagnosis not present

## 2016-07-07 DIAGNOSIS — I350 Nonrheumatic aortic (valve) stenosis: Secondary | ICD-10-CM | POA: Insufficient documentation

## 2016-07-07 NOTE — Progress Notes (Signed)
  Echocardiogram 2D Echocardiogram has been performed.  [name] 07/07/2016, 11:40 AM

## 2016-07-08 DIAGNOSIS — Z950 Presence of cardiac pacemaker: Secondary | ICD-10-CM | POA: Diagnosis not present

## 2016-07-08 DIAGNOSIS — I495 Sick sinus syndrome: Secondary | ICD-10-CM | POA: Diagnosis not present

## 2016-07-17 DIAGNOSIS — K589 Irritable bowel syndrome without diarrhea: Secondary | ICD-10-CM | POA: Diagnosis not present

## 2016-07-17 DIAGNOSIS — I351 Nonrheumatic aortic (valve) insufficiency: Secondary | ICD-10-CM | POA: Diagnosis not present

## 2016-07-23 DIAGNOSIS — M8589 Other specified disorders of bone density and structure, multiple sites: Secondary | ICD-10-CM | POA: Diagnosis not present

## 2016-08-25 DIAGNOSIS — H2511 Age-related nuclear cataract, right eye: Secondary | ICD-10-CM | POA: Diagnosis not present

## 2016-08-26 DIAGNOSIS — H2512 Age-related nuclear cataract, left eye: Secondary | ICD-10-CM | POA: Diagnosis not present

## 2016-09-01 DIAGNOSIS — H2512 Age-related nuclear cataract, left eye: Secondary | ICD-10-CM | POA: Diagnosis not present

## 2016-09-01 DIAGNOSIS — H25812 Combined forms of age-related cataract, left eye: Secondary | ICD-10-CM | POA: Diagnosis not present

## 2016-09-01 DIAGNOSIS — H25012 Cortical age-related cataract, left eye: Secondary | ICD-10-CM | POA: Diagnosis not present

## 2016-10-07 ENCOUNTER — Encounter: Payer: Self-pay | Admitting: Internal Medicine

## 2016-10-07 DIAGNOSIS — I495 Sick sinus syndrome: Secondary | ICD-10-CM | POA: Diagnosis not present

## 2016-10-10 ENCOUNTER — Other Ambulatory Visit: Payer: Self-pay | Admitting: Internal Medicine

## 2016-10-16 DIAGNOSIS — H01009 Unspecified blepharitis unspecified eye, unspecified eyelid: Secondary | ICD-10-CM

## 2016-10-16 HISTORY — DX: Unspecified blepharitis unspecified eye, unspecified eyelid: H01.009

## 2016-10-20 ENCOUNTER — Other Ambulatory Visit: Payer: Self-pay | Admitting: Internal Medicine

## 2016-10-20 MED ORDER — METOPROLOL TARTRATE 25 MG PO TABS: 25.0000 mg | ORAL_TABLET | Freq: Two times a day (BID) | ORAL | 0 refills | Status: DC

## 2016-10-21 ENCOUNTER — Other Ambulatory Visit: Payer: Self-pay | Admitting: *Deleted

## 2016-10-21 MED ORDER — APIXABAN 5 MG PO TABS: 5.0000 mg | ORAL_TABLET | Freq: Two times a day (BID) | ORAL | 1 refills | Status: DC

## 2016-10-21 NOTE — Telephone Encounter (Signed)
Patient requested a thirty day rx to last until her mail order arrives.

## 2016-10-21 NOTE — Telephone Encounter (Signed)
Pt requested 30 day supply of Eliquis 5mg  to be sent to local pharmacy until mail order refill arrives. Pt is 76yrs old, 54.3kg, Cre-0.92 & last saw Dr. Caryl Comes in 11/29/15; therefore sent in 30 day supply to local pharmacy for the pt.

## 2016-12-08 ENCOUNTER — Other Ambulatory Visit: Payer: Self-pay | Admitting: Internal Medicine

## 2017-01-13 DIAGNOSIS — S2239XA Fracture of one rib, unspecified side, initial encounter for closed fracture: Secondary | ICD-10-CM

## 2017-01-13 HISTORY — DX: Fracture of one rib, unspecified side, initial encounter for closed fracture: S22.39XA

## 2017-01-19 ENCOUNTER — Encounter: Payer: Self-pay | Admitting: Internal Medicine

## 2017-01-19 DIAGNOSIS — I495 Sick sinus syndrome: Secondary | ICD-10-CM | POA: Diagnosis not present

## 2017-02-18 ENCOUNTER — Encounter: Payer: Self-pay | Admitting: Internal Medicine

## 2017-03-02 ENCOUNTER — Telehealth: Payer: Self-pay | Admitting: Internal Medicine

## 2017-03-02 NOTE — Telephone Encounter (Signed)
Left pt a message to call back. 

## 2017-03-02 NOTE — Telephone Encounter (Signed)
New message    Patient calling C/O weird symptoms she would like to discuss with the nurse did not want to go into details.    Patient aware that Dr. Caryl Comes nurse is off today  Ask patient was she having chest pain - patient verbalized no  Ask patient was she having sob - patient verbalized no

## 2017-03-02 NOTE — Telephone Encounter (Signed)
Pt called about being concern that sometimes she feels a little light headed slight dizziness and sometimes have nauseas and headache. Pt was taking a strong antibiotic which stop taking last Thursday, and maybe it was causing the nauseas. Pt is wandering if it is her heart that is causing the other symptoms. Pt has a pacemake and before when she had some of these symptoms she felt palpitations, but not at this time. Pt said that she is going on vacation to [place] this coming Saturday for two weeks. She would like to have her device checked. Pt is aware that I will send this message to the device clinic for reviewing.  Pt said that she made an appointment to see Chanetta Marshall NP 7/11

## 2017-03-03 ENCOUNTER — Other Ambulatory Visit: Payer: Self-pay | Admitting: Internal Medicine

## 2017-03-03 ENCOUNTER — Other Ambulatory Visit: Payer: Self-pay | Admitting: *Deleted

## 2017-03-03 MED ORDER — METOPROLOL TARTRATE 25 MG PO TABS: 25.0000 mg | ORAL_TABLET | Freq: Two times a day (BID) | ORAL | 0 refills | Status: DC

## 2017-03-03 NOTE — Telephone Encounter (Signed)
Patient left a msg on the refill vm requesting that a thirty day supply of metoprolol be sent to [facility] in [place]. She is overdue for an appointment but already has one scheduled. I will send in a one month refill and further refills can be approved at her office visit.

## 2017-03-03 NOTE — Telephone Encounter (Signed)
Patient scheduled for PPM check on 03/05/17 with Device Clinic.

## 2017-03-05 ENCOUNTER — Encounter: Payer: Self-pay | Admitting: Cardiology

## 2017-03-05 ENCOUNTER — Ambulatory Visit (INDEPENDENT_AMBULATORY_CARE_PROVIDER_SITE_OTHER): Payer: Medicare Other | Admitting: *Deleted

## 2017-03-05 ENCOUNTER — Ambulatory Visit
Admission: RE | Admit: 2017-03-05 | Discharge: 2017-03-05 | Disposition: A | Discharge status: Home / Self Care | Payer: Medicare Other | Source: Ambulatory Visit | Attending: Internal Medicine | Admitting: Internal Medicine

## 2017-03-05 ENCOUNTER — Ambulatory Visit (INDEPENDENT_AMBULATORY_CARE_PROVIDER_SITE_OTHER): Payer: Medicare Other | Admitting: Cardiology

## 2017-03-05 ENCOUNTER — Other Ambulatory Visit: Payer: Self-pay | Admitting: Internal Medicine

## 2017-03-05 VITALS — BP 139/58

## 2017-03-05 VITALS — BP 122/60 | HR 70 | Ht 60.0 in | Wt 115.0 lb

## 2017-03-05 DIAGNOSIS — R079 Chest pain, unspecified: Secondary | ICD-10-CM

## 2017-03-05 DIAGNOSIS — R42 Dizziness and giddiness: Secondary | ICD-10-CM

## 2017-03-05 DIAGNOSIS — R002 Palpitations: Secondary | ICD-10-CM | POA: Diagnosis not present

## 2017-03-05 LAB — CUP PACEART INCLINIC DEVICE CHECK
Battery Impedance: 2900 Ohm
Brady Statistic RA Percent Paced: 96 %
Date Time Interrogation Session: 20180621132825
Implantable Lead Implant Date: 20090617
Implantable Lead Location: 753860
MDC IDC LEAD IMPLANT DT: 20090617
MDC IDC LEAD LOCATION: 753859
MDC IDC MSMT BATTERY VOLTAGE: 2.79 V
MDC IDC MSMT LEADCHNL RA IMPEDANCE VALUE: 436 Ohm
MDC IDC MSMT LEADCHNL RA PACING THRESHOLD AMPLITUDE: 0.5 V
MDC IDC MSMT LEADCHNL RA PACING THRESHOLD PULSEWIDTH: 0.4 ms
MDC IDC MSMT LEADCHNL RA SENSING INTR AMPL: 3.8 mV
MDC IDC PG IMPLANT DT: 20090617
MDC IDC PG SERIAL: 1237757
MDC IDC SET LEADCHNL RA PACING AMPLITUDE: 2 V

## 2017-03-05 NOTE — Progress Notes (Signed)
03/05/2017 [name]   [birth year]  [id number]  Primary Physician Levin Erp, MD Primary Cardiologist/ Electrophysiologist: Dr. Caryl Comes   Reason for Visit/CC: Dizziness, fatigue   HPI:  [name] is a 76 y.o. female followed by Dr. 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 Also with a h/o PAF s/p PPM insertion. She is on Eliquis for a/c. She also has a chronic LBBB.   She was added on to my schedule today after being seen in the device clinic today. She was scheduled for PPM device interrogation and noted symptoms of dizziness, fatigue and generalized malise. However device interrogation showed no significant athymias. Her last episode of afib was in April. She notes that about 2 weeks ago she was [place] and tripped and had a slight fall. No head trauma. She fell back on her elbow and hit her left side (CXR ordered by PCP today shows left sided 5th rib fracture). Since her fall, she has felt poorly. She reports slightly decreased appetite. No cough, fever, chills/n/v/d. She is unsure of any recent tick exposure. No CP or dyspnea. She denies dysuria. She saw her PCP today, Dr. Levin Erp, who ordered basic labs and a UA. Results are pending. Orthostatic VS were checked in our office today and are negative (see below).    Orthostatic VS for the past 24 hrs:  BP- Lying Pulse- Lying BP- Sitting Pulse- Sitting BP- Standing at 0 minutes Pulse- Standing at 0 minutes  03/05/17 1540 134/58 69 136/56 71 138/60 81     Current Meds  Medication Sig  . ACETAMINOPHEN PO Take 1,000 mg by mouth 3 (three) times daily.   Marland Kitchen amoxicillin (AMOXIL) 500 MG tablet Take 2,000 mg by mouth as directed. Take 1 hour before dental procedures  . apixaban (ELIQUIS) 5 MG TABS tablet Take 1 tablet (5 mg total) by mouth 2 (two) times daily.  . Calcium Carbonate-Vitamin D (CALCIUM + D PO)  Take 600 mg by mouth 2 (two) times daily.   . cycloSPORINE (RESTASIS) 0.05 % ophthalmic emulsion Place 1 drop into both eyes 2 (two) times daily.  . diclofenac sodium (VOLTAREN) 1 % GEL Apply 4 g topically 2 (two) times daily.  . diphenhydrAMINE (BENADRYL) 25 mg capsule Take 25 mg by mouth at bedtime.  Marland Kitchen latanoprost (XALATAN) 0.005 % ophthalmic solution Place 1 drop into both eyes at bedtime.  Marland Kitchen loperamide (IMODIUM) 2 MG capsule Take 4 mg by mouth 3 (three) times daily as needed for diarrhea or loose stools.  . magnesium oxide (MAG-OX) 400 MG tablet Take 1 tablet (400 mg total) by mouth 2 (two) times daily.  . Melatonin 3 MG CAPS Take 1 capsule by mouth at bedtime as needed (sleep).  . metoprolol tartrate (LOPRESSOR) 25 MG tablet Take 1 tablet (25 mg total) by mouth 2 (two) times daily. *Patient needs to keep upcoming appointment for further refills*  . omeprazole (PRILOSEC) 20 MG capsule Take 20 mg by mouth daily.  . valACYclovir (VALTREX) 500 MG tablet Take 500 mg by mouth daily.    Allergies  Allergen Reactions  . Amiodarone     Severe rash and upset stomach   Past Medical History:  Diagnosis Date  . Asymmetric septal hypertrophy (HCC)   . Atrial fibrillation ([place])    with permanent pacemaker  . Current use of long term anticoagulation   . Endometriosis   . Glaucoma   .  H/O mitral valve repair   . 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 IBS (irritable bowel syndrome) 12/2002  . Irregular heart beat   . 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 Severe mitral regurgitation    Family History  Problem Relation Age of Onset  . Breast cancer Mother 38  . Breast cancer Maternal Grandmother 58  . Coronary artery disease Brother        n his 23s with triple bypass   . Coronary artery disease Brother        with 2 vessel coronary disease at age 66  . Heart attack Father    Past Surgical History:  Procedure  Laterality Date  . APPENDECTOMY  1952  . HYSTEROSCOPY  06/2002   Polyp; Small fibroid  . HYSTEROSCOPY  09/10/09   Myoma  . MITRAL VALVE REPAIR      (plication of posterior leaflet with 28-mm Medtronic CG Future  Band annuloplasty). (02/23/08  Dr. Roxy Manns)  . MYOMECTOMY     median sternotomy  . PACEMAKER INSERTION  02/2008  . TONSILLECTOMY AND ADENOIDECTOMY  1955  . TUBAL LIGATION  1983   Social History   Social History  . Marital status: Married    Spouse name: N/A  . Number of children: N/A  . Years of education: N/A   Occupational History  . Not on file.   Social History Main Topics  . Smoking status: Former Research scientist (life sciences)  . Smokeless tobacco: Never Used     Comment: QUIT 40 YEARS AGO  . Alcohol use 3.0 - 3.6 oz/week    5 - 6 Glasses of wine per week  . Drug use: No  . Sexual activity: Yes    Partners: Male    Birth control/ protection: Post-menopausal   Other Topics Concern  . Not on file   Social History Narrative   Family History:   Last updated: 12/07/2008   Is remarkable for brother in his 81s with triple bypass and another brother with 2 vessel coronary disease at age 52.  Her father died at a later age with a myocardial infarction.       Social History:   Last updated: 12/07/2008   The patient is married and lives here in [place] with her husband.  They have 1 grown child.  She works full-time and as an Network engineer at [employer].  Her follow is Dr. Mendel Ryder.  She is a nonsmoker, although she did smoke briefly during her youth and quit smoking during her 17s.  She denies excessive alcohol consumption.            Review of Systems: General: negative for chills, fever, night sweats or weight changes.  Cardiovascular: negative for chest pain, dyspnea on exertion, edema, orthopnea, palpitations, paroxysmal nocturnal dyspnea or shortness of breath Dermatological: negative for rash Respiratory: negative for cough or wheezing Urologic: negative for  hematuria Abdominal: negative for nausea, vomiting, diarrhea, bright red blood per rectum, melena, or hematemesis Neurologic: negative for visual changes, syncope, or dizziness All other systems reviewed and are otherwise negative except as noted above.   Physical Exam:  Blood pressure 122/60, pulse 70, height 5' (1.524 m), weight 115 lb (52.2 kg), SpO2 98 %.  General appearance: alert, cooperative and no distress Neck: no carotid bruit and no JVD Lungs: clear to auscultation bilaterally Heart: regular rate and rhythm, S1, S2 normal, no murmur, click, rub  or gallop Extremities: extremities normal, atraumatic, no cyanosis or edema Pulses: 2+ and symmetric Skin: Skin color, texture, turgor normal. No rashes or lesions Neurologic: Grossly normal  EKG electric atrial pacemaker, chronic LBBB -- personally reviewed   ASSESSMENT AND PLAN:    Based on assessment, I do not feel that her symptoms are cardiac related. Her BP is stable. Orthostatic VS are negative. Device interrogation showed no arrhthymias to correlate with the timing of her symptoms. Her last episode of Afib was in April. Her device is functioning normally. She denies CP and dyspnea. No syncope/ near syncope. Her EKG shows NSR with chronic LBBB, unchanged from prior. Her physical exam is benign. She was seen by her PCP earlier today, and basic laboratory work as well as a UA was collected. Results pending. No further cardiac w/u indicated at this time. She has been encouraged to keep her EP clinic appt that is scheduled with EP APP in mid July.     Kyisha Fowle Ladoris Gene, MHS [facility] 03/05/2017 4:18 PM

## 2017-03-05 NOTE — Progress Notes (Signed)
Pacemaker check in clinic. Normal device function. Threshold, sensing, impedance consistent with previous measurements. Device programmed to maximize longevity. 55 AT/AF episodes (<1% burden), most recent 12/28/2016, lasting 38 seconds, peak rate 202 bpm. Episode triggers are off. Device programmed at appropriate safety margin. Histogram distribution appropriate for patient activity level. Device programmed to optimize intrinsic conduction. Estimated longevity 3-4 years. Patient education completed. Patient c/o of lightheadedness, dizziness, nausea, and patient states "feeling my heartbeat in my stomach". No chest pain or SOB. Patient states she feels worse upon waking in the morning and gets better throughout the day. She also states she just recently stopped taking an antibiotic 6 days ago due to these symptoms. Reviewed with Dr. Olin Pia RN, Nira Conn. EKG completed, BP 139/58, scheduled patient with Lyda Jester PA 03/05/17 at 3:30. ROV with AS 03/25/17.

## 2017-03-05 NOTE — Patient Instructions (Addendum)
Medication Instructions: Your physician recommends that you continue on your current medications as directed. Please refer to the Current Medication list given to you today.  Labwork: None Ordered  Procedures/Testing: None Ordered  Follow-Up: Your physician recommends that you keep scheduled follow up with Chanetta Marshall, NP on March 25, 2017 at 11:40 am.    If you need a refill on your cardiac medications before your next appointment, please call your pharmacy.

## 2017-03-06 ENCOUNTER — Encounter: Payer: Self-pay | Admitting: Nurse Practitioner

## 2017-03-09 ENCOUNTER — Telehealth: Payer: Self-pay | Admitting: *Deleted

## 2017-03-09 NOTE — Telephone Encounter (Signed)
Dr. Nyoka Cowden calling regarding recent in-office device check. He wanted to know about arrhythmias, I let him know the most recent arrhythmia was in April. He verbalizes understanding and is appreciative of information.

## 2017-03-12 ENCOUNTER — Emergency Department (HOSPITAL_COMMUNITY)
Admission: EM | Admit: 2017-03-12 | Discharge: 2017-03-12 | Disposition: A | Discharge status: Home / Self Care | Payer: Medicare Other | Attending: Emergency Medicine | Admitting: Emergency Medicine

## 2017-03-12 ENCOUNTER — Encounter (HOSPITAL_COMMUNITY): Payer: Self-pay | Admitting: Emergency Medicine

## 2017-03-12 ENCOUNTER — Telehealth: Payer: Self-pay | Admitting: Internal Medicine

## 2017-03-12 ENCOUNTER — Emergency Department (HOSPITAL_COMMUNITY): Payer: Medicare Other

## 2017-03-12 DIAGNOSIS — R002 Palpitations: Secondary | ICD-10-CM | POA: Diagnosis present

## 2017-03-12 DIAGNOSIS — Z950 Presence of cardiac pacemaker: Secondary | ICD-10-CM | POA: Diagnosis not present

## 2017-03-12 DIAGNOSIS — I493 Ventricular premature depolarization: Secondary | ICD-10-CM

## 2017-03-12 DIAGNOSIS — Z79899 Other long term (current) drug therapy: Secondary | ICD-10-CM | POA: Insufficient documentation

## 2017-03-12 DIAGNOSIS — Z87891 Personal history of nicotine dependence: Secondary | ICD-10-CM | POA: Insufficient documentation

## 2017-03-12 DIAGNOSIS — Z7901 Long term (current) use of anticoagulants: Secondary | ICD-10-CM | POA: Insufficient documentation

## 2017-03-12 LAB — PROTIME-INR
INR: 0.92
Prothrombin Time: 12.4 seconds (ref 11.4–15.2)

## 2017-03-12 LAB — URINALYSIS, ROUTINE W REFLEX MICROSCOPIC
Bilirubin Urine: NEGATIVE
Glucose, UA: NEGATIVE mg/dL
Hgb urine dipstick: NEGATIVE
Ketones, ur: NEGATIVE mg/dL
Leukocytes, UA: NEGATIVE
Nitrite: NEGATIVE
Protein, ur: NEGATIVE mg/dL
Specific Gravity, Urine: 1.009 (ref 1.005–1.030)
pH: 8 (ref 5.0–8.0)

## 2017-03-12 LAB — BASIC METABOLIC PANEL
Anion gap: 8 (ref 5–15)
BUN: 16 mg/dL (ref 6–20)
CALCIUM: 9.8 mg/dL (ref 8.9–10.3)
CHLORIDE: 106 mmol/L (ref 101–111)
CO2: 25 mmol/L (ref 22–32)
CREATININE: 0.78 mg/dL (ref 0.44–1.00)
GFR calc non Af Amer: >60 mL/min (ref >60)
GLUCOSE: 102 mg/dL — AB (ref 65–99)
Potassium: 4 mmol/L (ref 3.5–5.1)
Sodium: 139 mmol/L (ref 135–145)

## 2017-03-12 LAB — AMMONIA: Ammonia: 21 umol/L (ref 9–35)

## 2017-03-12 LAB — I-STAT TROPONIN, ED: Troponin i, poc: 0 ng/mL (ref 0.00–0.08)

## 2017-03-12 LAB — CBC
HCT: 42.3 % (ref 36.0–46.0)
Hemoglobin: 13.9 g/dL (ref 12.0–15.0)
MCH: 31.8 pg (ref 26.0–34.0)
MCHC: 32.9 g/dL (ref 30.0–36.0)
MCV: 96.8 fL (ref 78.0–100.0)
PLATELETS: 257 10*3/uL (ref 150–400)
RBC: 4.37 MIL/uL (ref 3.87–5.11)
RDW: 12.5 % (ref 11.5–15.5)
WBC: 9.4 10*3/uL (ref 4.0–10.5)

## 2017-03-12 LAB — HEPATIC FUNCTION PANEL
ALK PHOS: 83 U/L (ref 38–126)
ALT: 24 U/L (ref 14–54)
AST: 27 U/L (ref 15–41)
Albumin: 3.9 g/dL (ref 3.5–5.0)
BILIRUBIN INDIRECT: 0.6 mg/dL (ref 0.3–0.9)
BILIRUBIN TOTAL: 0.7 mg/dL (ref 0.3–1.2)
Bilirubin, Direct: 0.1 mg/dL (ref 0.1–0.5)
TOTAL PROTEIN: 6.9 g/dL (ref 6.5–8.1)

## 2017-03-12 LAB — MAGNESIUM: MAGNESIUM: 2 mg/dL (ref 1.7–2.4)

## 2017-03-12 MED ORDER — SODIUM CHLORIDE 0.9 % IV BOLUS (SEPSIS)
1000.0000 mL | Freq: Once | INTRAVENOUS | Status: AC
Start: 2017-03-12 — End: 2017-03-12
  Administered 2017-03-12: 1000 mL via INTRAVENOUS

## 2017-03-12 NOTE — Telephone Encounter (Signed)
Will forward to Dr. Caryl Comes and device clinic as I am unsure what adjustments can be made.

## 2017-03-12 NOTE — ED Notes (Signed)
Patient transported to X-ray 

## 2017-03-12 NOTE — Discharge Instructions (Signed)
Please stay hydrated. Please try and get some sleep. Please avoid caffeine. Please follow-up with both your PCP and/or cardiologist for further management of your palpitations and recurrent PVCs. If you develop any new symptoms such as chest pain, shortness of breath, passing out, or any other worrisome signs, please return to the nearest emergency department.

## 2017-03-12 NOTE — ED Triage Notes (Signed)
Per EMS:  Pt having chest pain off and on for 2 weeks. Pt states "today I felt worse and could feel the bottom of my heart beating against my chest".  Pt states dizziness, weakness and some nausea.  PTA vitals: 129/74, HR 70.  Pt has a pacemaker.

## 2017-03-12 NOTE — ED Provider Notes (Signed)
[facility] DEPT Provider Note   CSN: [id number] Arrival date & time: 03/12/17  1119     History   Chief Complaint Chief Complaint  Patient presents with  . Chest Pain    HPI [name] is a 76 y.o. female.  The history is provided by the patient and the spouse.  Palpitations   This is a recurrent problem. The current episode started more than 1 week ago. The problem occurs constantly. The problem has been gradually worsening. The problem is associated with an unknown factor. On average, each episode lasts 2 weeks. Pertinent negatives include no diaphoresis, no fever, no malaise/fatigue, no numbness, no chest pain, no chest pressure, no exertional chest pressure, no near-syncope, no syncope, no abdominal pain, no nausea, no vomiting, no headaches, no back pain, no leg pain, no lower extremity edema, no dizziness, no weakness, no cough, no shortness of breath and no sputum production. She has tried nothing for the symptoms. The treatment provided no relief. Her past medical history does not include anemia.    Past Medical History:  Diagnosis Date  . Asymmetric septal hypertrophy (HCC)   . Atrial fibrillation ([place])    with permanent pacemaker  . Current use of long term anticoagulation   . Endometriosis   . Glaucoma   . H/O mitral valve repair   . 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 IBS (irritable bowel syndrome) 12/2002  . Irregular heart beat   . 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 Severe mitral regurgitation     Patient Active Problem List   Diagnosis Date Noted  . Sinus node dysfunction ([place]) 12/03/2011  . MITRAL REGURGITATION 12/07/2008  . 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 Atrial fibrillation ([place]) 12/07/2008  . PACEMAKER-St.Jude 12/07/2008    Past Surgical History:  Procedure Laterality Date  . APPENDECTOMY  1952  . HYSTEROSCOPY  06/2002   Polyp;  Small fibroid  . HYSTEROSCOPY  09/10/09   Myoma  . MITRAL VALVE REPAIR      (plication of posterior leaflet with 28-mm Medtronic CG Future  Band annuloplasty). (02/23/08  Dr. Roxy Manns)  . MYOMECTOMY     median sternotomy  . PACEMAKER INSERTION  02/2008  . TONSILLECTOMY AND ADENOIDECTOMY  1955  . TUBAL LIGATION  1983    OB History    Gravida Para Term Preterm AB Living   2 1 1   1 1    SAB TAB Ectopic Multiple Live Births     1             Home Medications    Prior to Admission medications   Medication Sig Start Date End Date Taking? Authorizing Provider  ACETAMINOPHEN PO Take 1,000 mg by mouth 3 (three) times daily.     [provider]  amoxicillin (AMOXIL) 500 MG tablet Take 2,000 mg by mouth as directed. Take 1 hour before dental procedures    [provider]  apixaban (ELIQUIS) 5 MG TABS tablet Take 1 tablet (5 mg total) by mouth 2 (two) times daily. 10/21/16   Deboraha Sprang, MD  Calcium Carbonate-Vitamin D (CALCIUM + D PO) Take 600 mg by mouth 2 (two) times daily.     [provider]  cycloSPORINE (RESTASIS) 0.05 % ophthalmic emulsion Place 1 drop into both eyes 2 (two) times daily.    [provider]  diclofenac sodium (VOLTAREN) 1 % GEL Apply 4 g  topically 2 (two) times daily.    [provider]  diphenhydrAMINE (BENADRYL) 25 mg capsule Take 25 mg by mouth at bedtime.    [provider]  latanoprost (XALATAN) 0.005 % ophthalmic solution Place 1 drop into both eyes at bedtime.    [provider]  loperamide (IMODIUM) 2 MG capsule Take 4 mg by mouth 3 (three) times daily as needed for diarrhea or loose stools.    [provider]  magnesium oxide (MAG-OX) 400 MG tablet Take 1 tablet (400 mg total) by mouth 2 (two) times daily. 08/28/15   Deboraha Sprang, MD  Melatonin 3 MG CAPS Take 1 capsule by mouth at bedtime as needed (sleep).    [provider]  metoprolol tartrate (LOPRESSOR) 25 MG tablet Take 1  tablet (25 mg total) by mouth 2 (two) times daily. *Patient needs to keep upcoming appointment for further refills* 03/03/17   Deboraha Sprang, MD  omeprazole (PRILOSEC) 20 MG capsule Take 20 mg by mouth daily. 02/27/15   [provider]  valACYclovir (VALTREX) 500 MG tablet Take 500 mg by mouth daily.     [provider]    Family History Family History  Problem Relation Age of Onset  . Breast cancer Mother 44  . Breast cancer Maternal Grandmother 52  . Coronary artery disease Brother        n his 53s with triple bypass   . Coronary artery disease Brother        with 2 vessel coronary disease at age 21  . Heart attack Father     Social History Social History  Substance Use Topics  . Smoking status: Former Research scientist (life sciences)  . Smokeless tobacco: Never Used     Comment: QUIT 40 YEARS AGO  . Alcohol use 3.0 - 3.6 oz/week    5 - 6 Glasses of wine per week     Allergies   Amiodarone and Doxycycline   Review of Systems Review of Systems  Constitutional: Negative for appetite change, chills, diaphoresis, fatigue, fever and malaise/fatigue.  HENT: Negative for congestion.   Eyes: Negative for photophobia and visual disturbance.  Respiratory: Negative for cough, sputum production, chest tightness and shortness of breath.   Cardiovascular: Positive for palpitations. Negative for chest pain, leg swelling, syncope and near-syncope.  Gastrointestinal: Negative for abdominal pain, constipation, diarrhea, nausea and vomiting.  Genitourinary: Negative for dysuria and frequency.  Musculoskeletal: Negative for back pain, neck pain and neck stiffness.  Skin: Negative for rash and wound.  Neurological: Negative for dizziness, speech difficulty, weakness, light-headedness, numbness and headaches.  Psychiatric/Behavioral: Negative for agitation.  All other systems reviewed and are negative.    Physical Exam Updated Vital Signs BP (!) 160/56 (BP Location: Right Arm)   Pulse 70    Temp 98 F (36.7 C) (Oral)   Resp 18   Ht 5' (1.524 m)   Wt 52.2 kg (115 lb)   SpO2 99%   BMI 22.46 kg/m   Physical Exam  Constitutional: She is oriented to person, place, and time. She appears well-developed and well-nourished. No distress.  HENT:  Head: Normocephalic and atraumatic.  Right Ear: External ear normal.  Left Ear: External ear normal.  Nose: Nose normal.  Mouth/Throat: Oropharynx is clear and moist. No oropharyngeal exudate.  Eyes: Conjunctivae and EOM are normal. Pupils are equal, round, and reactive to light.  Neck: Normal range of motion. Neck supple.  Cardiovascular: Normal rate and intact distal pulses.   Murmur heard. Pulmonary/Chest:  Effort normal and breath sounds normal. No stridor. No respiratory distress. She has no wheezes. She exhibits no tenderness.  Abdominal: Soft. She exhibits no distension. There is no tenderness. There is no rebound.  Musculoskeletal: She exhibits no tenderness.  Neurological: She is alert and oriented to person, place, and time. She has normal reflexes. No cranial nerve deficit or sensory deficit. She exhibits normal muscle tone. Coordination normal.  Skin: Skin is warm. Capillary refill takes less than 2 seconds. No rash noted. She is not diaphoretic. No erythema.  Psychiatric: She has a normal mood and affect.  Nursing note and vitals reviewed.    ED Treatments / Results  Labs (all labs ordered are listed, but only abnormal results are displayed) Labs Reviewed  BASIC METABOLIC PANEL - Abnormal; Notable for the following:       Result Value   Glucose, Bld 102 (*)    All other components within normal limits  URINALYSIS, ROUTINE W REFLEX MICROSCOPIC - Abnormal; Notable for the following:    Color, Urine STRAW (*)    All other components within normal limits  URINE CULTURE  CBC  AMMONIA  HEPATIC FUNCTION PANEL  MAGNESIUM  PROTIME-INR  I-STAT TROPOININ, ED    EKG  EKG Interpretation  Date/Time:  Thursday March 12 2017 11:30:28 EDT Ventricular Rate:  70 PR Interval:    QRS Duration: 139 QT Interval:  477 QTC Calculation: 515 R Axis:   53 Text Interpretation:  Sinus rhythm Probable left atrial enlargement Left bundle branch block When compared to prior, no significant changes seen. Similar LBBB.  No STEMI Confirmed by Antony Blackbird [phone]) on 03/12/2017 11:35:35 AM Also confirmed by Antony Blackbird [phone]), editor Laurena Spies [phone])  on 03/12/2017 12:03:21 PM       Radiology Dg Chest 2 View  Result Date: 03/12/2017 CLINICAL DATA:  Two weeks of lightheadedness and dizziness which was worse this morning. Remote history of smoking. History of mitral regurgitation and mitral valve repair, IHSS. EXAM: CHEST  2 VIEW COMPARISON:  Chest x-ray of March 05, 2017 FINDINGS: The lungs are well-expanded. There is no focal infiltrate. There is scarring at the left lung base. The heart and pulmonary vascularity are normal. The sternal wires are intact. The permanent pacemaker is in reasonable position. The radiodense prosthetic mitral valve ring is visible. There is no pleural effusion or pneumothorax. There is old deformity of the distal left clavicle. Old deformities of the posterior third and fourth ribs are noted. There is calcification in the wall of the aortic arch. IMPRESSION: There is no acute cardiopulmonary abnormality. Stable mild chronic bronchitic changes. Electronically Signed   By: David  Martinique M.D.   On: 03/12/2017 12:45    Procedures Procedures (including critical care time)  Medications Ordered in ED Medications  sodium chloride 0.9 % bolus 1,000 mL (0 mLs Intravenous Stopped 03/12/17 1448)     Initial Impression / Assessment and Plan / ED Course  I have reviewed the triage vital signs and the nursing notes.  Pertinent labs & imaging results that were available during my care of the patient were reviewed by me and considered in my medical decision making (see chart for details).      EATHER CHAIRES is a 76 y.o. female with a past medical history significant for atrial fibrillation on L Lucas, pacemaker, prior mitral valve repair, prior myomectomy for septal wall thickening, and sinus node dysfunction who presents with palpitations. Patient says that over the last 2 weeks, she has  had continued palpitations. She went to see her PCP who decided to discontinue several medications yesterday. She says that today, her palpitations continued and worsened. She denies chest pain, chest pressure, or any shortness of breath. She says that the "thumping palpitations" have gotten to where she is concerned. She denies any fevers, chills, nausea, vomiting, constipation, diarrhea, dysuria. She does report some urinary frequency but denies any other symptoms. She denies any recent chest trauma. She denies other complaint.  History and exam are seen above. On exam, patient has a systolic murmur. Chest is nontender. Lungs are completely clear. Abdomen is nontender. Symmetric pulses in upper extremities. No lower extremity edema. Patient appears well and comfortable.  Patient had EKG showing PVC but otherwise no changes from prior. Chest x-ray shows no heart or pulmonary abnormality. Laboratory testing was grossly reassuring. Magnesium normal, INR nonelevated, hepatic function unremarkable, urinalysis shows no UTI, troponin not elevated, ammonia nonelevated, labs otherwise reassuring. Next  Unsure etiology of patient's palpitations, suspect dehydration and fatigue as she reports she did not sleep well last night. Also considering the patient's recent medication changes as this cause.  Do not feel patient has a concerning cause of the palpitations at this time as there does not appear to be any organ injury. Patient is otherwise having no symptoms aside from the palpitations.  Patient felt stable to be discharged home to follow-up with her PCP and her cardiologist. Patient did receive fluids which  appears to improve the palpitation amount.   Patient family understood the plan of care as well as return precautions. No other questions or concerns and felt comfortable discharge. Patient discharged in good condition.    Final Clinical Impressions(s) / ED Diagnoses   Final diagnoses:  Palpitations  PVC (premature ventricular contraction)    New Prescriptions Discharge Medication List as of 03/12/2017  2:36 PM      Clinical Impression: 1. Palpitations   2. PVC (premature ventricular contraction)     Disposition: Discharge  Condition: Good  I have discussed the results, Dx and Tx plan with the pt(& family if present). He/she/they expressed understanding and agree(s) with the plan. Discharge instructions discussed at great length. Strict return precautions discussed and pt &/or family have verbalized understanding of the instructions. No further questions at time of discharge.    Discharge Medication List as of 03/12/2017  2:36 PM      Follow Up: Levin Erp, MD [address] [place] [address] [phone]  Schedule an appointment as soon as possible for a visit    [facility] [address] [id number] [place] [phone]  If symptoms worsen     Tegeler, Gwenyth Allegra, MD 03/13/17 [phone]

## 2017-03-12 NOTE — ED Notes (Signed)
Pt back from XR 

## 2017-03-12 NOTE — Telephone Encounter (Signed)
New Message     Pt was seen in er for PVC's and palpitations and she wants Dr Caryl Comes to make some adjustments to her pacemaker, she has appt 03/25/17 with Amber but she wants the adjustments made before then

## 2017-03-13 LAB — URINE CULTURE: CULTURE: NO GROWTH

## 2017-03-13 NOTE — Telephone Encounter (Signed)
To [facility] & Dr. Caryl Comes.

## 2017-03-13 NOTE — Telephone Encounter (Signed)
°  Follow Up   Pt calling following up on call from yesterday.

## 2017-03-16 ENCOUNTER — Telehealth: Payer: Self-pay

## 2017-03-16 NOTE — Telephone Encounter (Signed)
Spoke to [employer] about patient's c/o. Dr. Lovena Le recommended that patient f/u with [facility] as scheduled (on 7/5 per Dr.Green), no changes to medications at this time. Informed patient of Dr.Taylor's recommendations. Patient verbalized understanding.

## 2017-03-16 NOTE — Telephone Encounter (Signed)
Called patient regarding PVCs. Patient started conversation by telling me how frustrated she was by the late response to her original call. I apologized to patient. Patient verbalized understanding and then proceeded to tell me about her recent issues with PVCs. Patient stated that Dr.Klein had made an adjustment to her ppm a couple of years ago that helped with the sx's. I told patient that she is now programmed AAIR @ 70bpm d/t sx's she had with Vp. I explained to her that it sounds like she probably needs a medication adjustment. I explained to her that Dr.Klein is not in the office today, but I could talk to Dr.Taylor (DOD) and call back with recommendations. Patient verbalized understanding and then said that she wanted to see a provider in the office before 7/11. She said that she wanted to f/u with an EP and not an APP. I explained to patient that Dr.Klein's schedule was fully booked and that it would be easier for her to f/u with an APP. Patient again verbalized frustration about not being able to be seen sooner, but states that she would like to hear what Dr.Taylor recommends and then take it from there.  Will call patient back with recommendations.

## 2017-03-16 NOTE — Telephone Encounter (Signed)
Call received to DOD.  Per DOD-schedule appt with Dr. Caryl Comes for this week d/t excess PVC's.  Will schedule.

## 2017-03-16 NOTE — Telephone Encounter (Signed)
Follow Up   Pt never got a phone call back and irate now that it is 7/2 and still no phone call. Requests a call back on atleast the status if there is no answer.

## 2017-03-19 ENCOUNTER — Encounter: Payer: Self-pay | Admitting: Internal Medicine

## 2017-03-19 ENCOUNTER — Ambulatory Visit (INDEPENDENT_AMBULATORY_CARE_PROVIDER_SITE_OTHER): Payer: Medicare Other | Admitting: Internal Medicine

## 2017-03-19 VITALS — BP 130/62 | HR 68 | Ht 60.0 in | Wt 114.2 lb

## 2017-03-19 DIAGNOSIS — I495 Sick sinus syndrome: Secondary | ICD-10-CM | POA: Diagnosis not present

## 2017-03-19 DIAGNOSIS — Z950 Presence of cardiac pacemaker: Secondary | ICD-10-CM | POA: Diagnosis not present

## 2017-03-19 DIAGNOSIS — I480 Paroxysmal atrial fibrillation: Secondary | ICD-10-CM

## 2017-03-19 DIAGNOSIS — I422 Other hypertrophic cardiomyopathy: Secondary | ICD-10-CM | POA: Diagnosis not present

## 2017-03-19 DIAGNOSIS — I493 Ventricular premature depolarization: Secondary | ICD-10-CM

## 2017-03-19 MED ORDER — VERAPAMIL HCL ER 120 MG PO TBCR: 120.0000 mg | EXTENDED_RELEASE_TABLET | Freq: Every day | ORAL | 3 refills | Status: DC

## 2017-03-19 NOTE — Patient Instructions (Signed)
Medication Instructions: - Your physician has recommended you make the following change in your medication:  1) Stop metoprolol tartrate 2) Start verapamil 120 mg- take one tablet by mouth once daily  Labwork: -  None ordered  Procedures/Testing: - none ordered  Follow-Up: - Your physician recommends that you schedule a follow-up appointment in: 3 months with Dr. Caryl Comes   Any Additional Special Instructions Will Be Listed Below (If Applicable). - Dr. Olin Pia nurse, Nira Conn, will call you next week to see how you are feeling with your PVC's    If you need a refill on your cardiac medications before your next appointment, please call your pharmacy.

## 2017-03-19 NOTE — Progress Notes (Signed)
Patient Care Team: Levin Erp, MD as PCP - General (Internal Medicine)   HPI  [name] is a 76 y.o. female Seen in follow-up for palpitations. 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    She was seen a few weeks ago because of the palpitations as noted. Her device was programmed to activate AV hysteresis. , we reprogrammed her device DDDR--DDIR to eliminate ventricular pacing as a potential explanation for her palpitations. Most recently reprogrammed her AAIR.  She continued with significant palpitations. An event recorder to elucidate the mechanism of her symptoms. >>>PVCs   Date Cr  K Mg  6/18 0.78 4.0 2.0         DATE TEST    10/15    Echo   EF 55-60 % Mod AI  10/17    Echo   EF 55-60 % Mod AI         She has called multiple times in the last few weeks complaining of increasing palpitations. She desires something be done pacemaker. She also is receiving frustrations today at the lack of response from our office and the anxieties that resulted when no PVCs were identified on ECG  She ended up in the emergency room with chest pain and palpitations.  These notes were reviewed. Troponin was normal.  Past Medical History:  Diagnosis Date  . Asymmetric septal hypertrophy (HCC)   . Atrial fibrillation ([place])    with permanent pacemaker  . Blepharitis 10/2016   Dr.Groat  . Current use of long term anticoagulation   . Endometriosis   . Glaucoma   . H/O mitral valve repair   . 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 IBS (irritable bowel syndrome) 12/2002  . Irregular heart beat   . 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 Rib fracture 01/2017   Dr.Green  . Severe mitral  regurgitation     Past Surgical History:  Procedure Laterality Date  . APPENDECTOMY  1952  . HYSTEROSCOPY  06/2002   Polyp; Small fibroid  . HYSTEROSCOPY  09/10/09   Myoma  . MITRAL VALVE REPAIR      (plication of posterior leaflet with 28-mm Medtronic CG Future  Band annuloplasty). (02/23/08  Dr. Roxy Manns)  . MYOMECTOMY     median sternotomy  . PACEMAKER INSERTION  02/2008  . TONSILLECTOMY AND ADENOIDECTOMY  1955  . TUBAL LIGATION  1983    Current Outpatient Prescriptions  Medication Sig Dispense Refill  . ACETAMINOPHEN PO Take 1,000 mg by mouth 3 (three) times daily.     Marland Kitchen amoxicillin (AMOXIL) 500 MG tablet Take 2,000 mg by mouth as directed. Take 1 hour before dental procedures    . apixaban (ELIQUIS) 5 MG TABS tablet Take 1 tablet (5 mg total) by mouth 2 (two) times daily. 60 tablet 1  . Calcium Carbonate-Vitamin D (CALCIUM + D PO) Take 600 mg by mouth 2 (two) times daily.     . cycloSPORINE (RESTASIS) 0.05 % ophthalmic emulsion Place 1 drop into both eyes 2 (two) times daily.    . diclofenac sodium (VOLTAREN) 1 % GEL Apply 4 g topically 2 (two) times daily.    . diphenhydrAMINE (BENADRYL) 25 mg capsule Take 25 mg by  mouth at bedtime.    . Lactobacillus (ACIDOPHILUS PO) Take by mouth 2 (two) times daily.    Marland Kitchen latanoprost (XALATAN) 0.005 % ophthalmic solution Place 1 drop into both eyes at bedtime.    Marland Kitchen loperamide (IMODIUM) 2 MG capsule Take 4 mg by mouth 3 (three) times daily as needed for diarrhea or loose stools.    . magnesium oxide (MAG-OX) 400 MG tablet Take 1 tablet (400 mg total) by mouth 2 (two) times daily. 30 tablet 6  . metoprolol tartrate (LOPRESSOR) 25 MG tablet Take 1 tablet (25 mg total) by mouth 2 (two) times daily. *Patient needs to keep upcoming appointment for further refills* 60 tablet 0  . predniSONE (DELTASONE) 10 MG tablet Take 5 mg by mouth 3 (three) times a week.    . sulfacetamide (BLEPH-10) 10 % ophthalmic solution Place 1 drop into the left eye 4 (four)  times daily.    . traMADol-acetaminophen (ULTRACET) 37.5-325 MG tablet Take by mouth as needed.  0  . valACYclovir (VALTREX) 500 MG tablet Take 500 mg by mouth every other day.      No current facility-administered medications for this visit.     Allergies  Allergen Reactions  . Amiodarone     Severe rash and upset stomach  . Doxycycline     Review of Systems negative except from HPI and PMH  Physical Exam BP 130/62 (BP Location: Right Arm, Patient Position: Sitting, Cuff Size: Normal)   Pulse 68   Ht 5' (1.524 m)   Wt 114 lb 4 oz (51.8 kg)   BMI 22.31 kg/m  Well developed and nourished in no acute distress HENT normal Neck supple with JVP-flat Carotids brisk and full without bruits Clear Regular rate and rhythm, 2/6 systolic and diastolic M Abd-soft with active BS without hepatomegaly No Clubbing cyanosis edema Skin-warm and dry A & Oriented  Grossly normal sensory and motor function  tremor  ECG obtained today demonstrates atrial pacing with intrinsic conduction at rate of 68 Intervals 20/14/44 Left bundle branch block PVCs with a right bundle indeterminate axis   Assessment and  Plan  Hypertrophic cardiomyopathy status post myectomy  Family has issues have been addressed  Aortic insufficiency-moderate with normal left ventricular size  Paroxysmal atrial fibrillation   pacemaker-St. Jude  PVCs and PACs  No atrial fibrillation. PVCs are noted on ECG today at a frequency of about 1/8.    We have discussed treatment strategies including alternative calcium/beta blockers, antiarrhythmic therapy, or catheter ablation. We have elected to proceed with a calcium blocker will try verapamil. We reviewed side effects. In the event that this is unsuccessful we'll try antiarrhythmic therapy.  There is modest LVH on her most recent echo with some asymmetry (15/8) would probably start with propafenone and if not effective than try a  class III or disopyramide.  We also  discussed her frustration in communication with our office. I will try and do better  I've also suggested she get back up with Dr. Nyoka Cowden to consider anxiolytics which previously had been very helpful   We spent more than 50% of our >25 min visit in face to face counseling regarding the above

## 2017-03-25 ENCOUNTER — Encounter: Payer: Medicare Other | Admitting: Nurse Practitioner

## 2017-03-27 ENCOUNTER — Encounter: Payer: Self-pay | Admitting: Internal Medicine

## 2017-03-31 ENCOUNTER — Telehealth: Payer: Self-pay | Admitting: *Deleted

## 2017-03-31 NOTE — Telephone Encounter (Signed)
I spoke with the patient regarding verapamil and her PVC's. She states that the verapamil does seem to be helping with her PVC's although it took a few days. She reported that she remembered in the past, she took xanax to help with the PVC's- she called Dr. Nyoka Cowden and he advised her to take xanaz 0.25 mg BID.  She feels that the addition of the xanax with the verapamil has helped, but she feels "flat." She states she also started a new sleeping aide- trazadone 50 mg 1/2 tablet at bedtime. She inquired if it may be ok to only take a 1/2 tablet of xanax in the morning. I advised her she could try taking only a 1/2 tablet of xanax in the morning with her verapamil and continue her full dose xanax in the evening as prescribed by Dr. Nyoka Cowden. She is agreeable and will try this.  She will call back if further concerns with her PVC's.  I advised her that per my conversation with the [facility] nurse, she does not need to do another remote since she is seeing Dr. Caryl Comes on 07/03/17. She voices understanding.   Also addressed with the patient her frustration in getting in touch with our office staff most recently.  We discussed the process for calls vs mychart messages.  I also discussed with her that I am only in the office 3 days a week, but for non-urgent issues, the best way to reach me would be through mychart. If urgent, then she would need to call the office and speak with the triage nurse. She states she did this before and was told by staff that Dr. Caryl Comes was not in the office, only Dr. Lovena Le, and that her concerns would not be addressed by him.   I offered for our manager/ supervisor to call back to discuss further with her and she was appreciative of this. Staff message sent to Kelly/ Con Memos to please call the patient sooner rather than later to follow up.

## 2017-03-31 NOTE — Telephone Encounter (Signed)
Spoke with patient and she was appreciative of my call.

## 2017-03-31 NOTE — Telephone Encounter (Signed)
-----   Message -----  From: Lestine Box  Sent: 03/27/2017  1:48 PM  To: [facility] Triage  Subject: Visit Follow-Up Question               This message is for Nira Conn, Dr. Olin Pia nurse.  Lizbett Garciagarcia.     You were to call me the end of this week to see how I am doing with my new beta blocker medication. I am doing well but I have a couple of questions for you. Please call me the first of next week.    You were also going to contact the Pacemaker telephone check folks to schedule my next appointment with them and let me know.    Many thanks, Fredda Hammed         I left a message for the patient to call.

## 2017-03-31 NOTE — Telephone Encounter (Signed)
I had addressed this issue last time she was in the office also, relating to both not being always there, and that I triage calls and had left late that night already and my choice to delay answering -- I apologized

## 2017-04-06 ENCOUNTER — Other Ambulatory Visit: Payer: Self-pay | Admitting: Gastroenterology

## 2017-04-06 ENCOUNTER — Ambulatory Visit
Admission: RE | Admit: 2017-04-06 | Discharge: 2017-04-06 | Disposition: A | Discharge status: Home / Self Care | Payer: Medicare Other | Source: Ambulatory Visit | Attending: Gastroenterology | Admitting: Gastroenterology

## 2017-04-06 DIAGNOSIS — R198 Other specified symptoms and signs involving the digestive system and abdomen: Secondary | ICD-10-CM

## 2017-04-08 ENCOUNTER — Ambulatory Visit
Admission: RE | Admit: 2017-04-08 | Discharge: 2017-04-08 | Disposition: A | Discharge status: Home / Self Care | Payer: Medicare Other | Source: Ambulatory Visit | Attending: Gastroenterology | Admitting: Gastroenterology

## 2017-04-08 DIAGNOSIS — R198 Other specified symptoms and signs involving the digestive system and abdomen: Secondary | ICD-10-CM

## 2017-04-10 ENCOUNTER — Encounter: Payer: Self-pay | Admitting: Internal Medicine

## 2017-04-14 ENCOUNTER — Telehealth: Payer: Self-pay | Admitting: Internal Medicine

## 2017-04-14 ENCOUNTER — Encounter: Payer: Self-pay | Admitting: Internal Medicine

## 2017-04-14 NOTE — Telephone Encounter (Signed)
New message   More PVC symptoms and she wants a call back to day she did not disclose any other information

## 2017-04-14 NOTE — Telephone Encounter (Signed)
My chart message also received from the patient. Response sent back to her that I would review with Dr. Caryl Comes and call her back.

## 2017-04-16 NOTE — Telephone Encounter (Signed)
I spoke with her the other night. She was to see Dr. Nyoka Cowden within the week. She was going to increase her verapamil to twice daily as long as her blood pressure was sufficient. We talked about flecainide as an alternative. She will let us know how she does on the higher dose verapamil.

## 2017-04-17 ENCOUNTER — Other Ambulatory Visit: Payer: Self-pay | Admitting: Internal Medicine

## 2017-04-17 NOTE — Telephone Encounter (Signed)
Request received for Eliquis 5mg , pt is 76yrs old, Wt-51.8kg, Crea-0.78 on 03/12/17, Last seen by Dr. Caryl Comes on 03/19/17. Will send in refill request to requested pharmacy.

## 2017-04-29 ENCOUNTER — Other Ambulatory Visit: Payer: Self-pay | Admitting: Physician Assistant

## 2017-04-29 ENCOUNTER — Ambulatory Visit
Admission: RE | Admit: 2017-04-29 | Discharge: 2017-04-29 | Disposition: A | Discharge status: Home / Self Care | Payer: Medicare Other | Source: Ambulatory Visit | Attending: Physician Assistant | Admitting: Physician Assistant

## 2017-04-29 DIAGNOSIS — R1030 Lower abdominal pain, unspecified: Secondary | ICD-10-CM

## 2017-04-29 DIAGNOSIS — K5900 Constipation, unspecified: Secondary | ICD-10-CM

## 2017-05-02 ENCOUNTER — Emergency Department (HOSPITAL_COMMUNITY): Payer: Medicare Other

## 2017-05-02 ENCOUNTER — Encounter (HOSPITAL_COMMUNITY): Payer: Self-pay | Admitting: Emergency Medicine

## 2017-05-02 ENCOUNTER — Inpatient Hospital Stay (HOSPITAL_COMMUNITY)
Admission: EM | Admit: 2017-05-02 | Discharge: 2017-05-05 | DRG: 392 | Disposition: A | Discharge status: Home / Self Care | Payer: Medicare Other | Attending: Internal Medicine | Admitting: Internal Medicine

## 2017-05-02 DIAGNOSIS — I421 Obstructive hypertrophic cardiomyopathy: Secondary | ICD-10-CM | POA: Diagnosis not present

## 2017-05-02 DIAGNOSIS — Z950 Presence of cardiac pacemaker: Secondary | ICD-10-CM | POA: Diagnosis present

## 2017-05-02 DIAGNOSIS — Z87891 Personal history of nicotine dependence: Secondary | ICD-10-CM

## 2017-05-02 DIAGNOSIS — Z7901 Long term (current) use of anticoagulants: Secondary | ICD-10-CM | POA: Diagnosis not present

## 2017-05-02 DIAGNOSIS — I422 Other hypertrophic cardiomyopathy: Secondary | ICD-10-CM | POA: Diagnosis present

## 2017-05-02 DIAGNOSIS — R109 Unspecified abdominal pain: Secondary | ICD-10-CM | POA: Diagnosis not present

## 2017-05-02 DIAGNOSIS — F419 Anxiety disorder, unspecified: Secondary | ICD-10-CM | POA: Diagnosis present

## 2017-05-02 DIAGNOSIS — Z881 Allergy status to other antibiotic agents status: Secondary | ICD-10-CM | POA: Diagnosis not present

## 2017-05-02 DIAGNOSIS — E876 Hypokalemia: Secondary | ICD-10-CM | POA: Diagnosis not present

## 2017-05-02 DIAGNOSIS — K529 Noninfective gastroenteritis and colitis, unspecified: Secondary | ICD-10-CM

## 2017-05-02 DIAGNOSIS — K560 Paralytic ileus: Secondary | ICD-10-CM | POA: Diagnosis present

## 2017-05-02 DIAGNOSIS — I4891 Unspecified atrial fibrillation: Secondary | ICD-10-CM | POA: Diagnosis not present

## 2017-05-02 DIAGNOSIS — Z888 Allergy status to other drugs, medicaments and biological substances status: Secondary | ICD-10-CM

## 2017-05-02 DIAGNOSIS — K589 Irritable bowel syndrome without diarrhea: Principal | ICD-10-CM | POA: Diagnosis present

## 2017-05-02 DIAGNOSIS — I480 Paroxysmal atrial fibrillation: Secondary | ICD-10-CM | POA: Diagnosis present

## 2017-05-02 DIAGNOSIS — Z7952 Long term (current) use of systemic steroids: Secondary | ICD-10-CM | POA: Diagnosis not present

## 2017-05-02 DIAGNOSIS — H409 Unspecified glaucoma: Secondary | ICD-10-CM | POA: Diagnosis present

## 2017-05-02 DIAGNOSIS — I495 Sick sinus syndrome: Secondary | ICD-10-CM | POA: Diagnosis present

## 2017-05-02 DIAGNOSIS — K219 Gastro-esophageal reflux disease without esophagitis: Secondary | ICD-10-CM | POA: Diagnosis present

## 2017-05-02 LAB — COMPREHENSIVE METABOLIC PANEL
ALT: 20 U/L (ref 14–54)
AST: 21 U/L (ref 15–41)
Albumin: 3.9 g/dL (ref 3.5–5.0)
Alkaline Phosphatase: 67 U/L (ref 38–126)
Anion gap: 11 (ref 5–15)
BUN: 17 mg/dL (ref 6–20)
CO2: 27 mmol/L (ref 22–32)
Calcium: 9.3 mg/dL (ref 8.9–10.3)
Chloride: 98 mmol/L — ABNORMAL LOW (ref 101–111)
Creatinine, Ser: 0.72 mg/dL (ref 0.44–1.00)
GFR calc Af Amer: >60 mL/min (ref >60)
GFR calc non Af Amer: >60 mL/min (ref >60)
Glucose, Bld: 109 mg/dL — ABNORMAL HIGH (ref 65–99)
Potassium: 3.8 mmol/L (ref 3.5–5.1)
Sodium: 136 mmol/L (ref 135–145)
Total Bilirubin: 1.3 mg/dL — ABNORMAL HIGH (ref 0.3–1.2)
Total Protein: 7.4 g/dL (ref 6.5–8.1)

## 2017-05-02 LAB — URINALYSIS, ROUTINE W REFLEX MICROSCOPIC
Bilirubin Urine: NEGATIVE
Glucose, UA: NEGATIVE mg/dL
Ketones, ur: 20 mg/dL — AB
Nitrite: NEGATIVE
Protein, ur: NEGATIVE mg/dL
Specific Gravity, Urine: 1.014 (ref 1.005–1.030)
pH: 6 (ref 5.0–8.0)

## 2017-05-02 LAB — CBC
HCT: 43.2 % (ref 36.0–46.0)
Hemoglobin: 14.7 g/dL (ref 12.0–15.0)
MCH: 31.7 pg (ref 26.0–34.0)
MCHC: 34 g/dL (ref 30.0–36.0)
MCV: 93.1 fL (ref 78.0–100.0)
Platelets: 259 10*3/uL (ref 150–400)
RBC: 4.64 MIL/uL (ref 3.87–5.11)
RDW: 12.3 % (ref 11.5–15.5)
WBC: 11.9 10*3/uL — ABNORMAL HIGH (ref 4.0–10.5)

## 2017-05-02 LAB — LIPASE, BLOOD: Lipase: 24 U/L (ref 11–51)

## 2017-05-02 MED ORDER — ACETAMINOPHEN 325 MG PO TABS: 650.0000 mg | ORAL_TABLET | Freq: Four times a day (QID) | ORAL | Status: DC | PRN

## 2017-05-02 MED ORDER — ACETAMINOPHEN 500 MG PO TABS
1000.0000 mg | ORAL_TABLET | Freq: Once | ORAL | Status: AC
  Administered 2017-05-02: 1000 mg via ORAL
  Filled 2017-05-02: qty 2

## 2017-05-02 MED ORDER — SODIUM CHLORIDE 0.9 % IV SOLN
INTRAVENOUS | Status: DC
  Administered 2017-05-02 – 2017-05-04 (×3): via INTRAVENOUS

## 2017-05-02 MED ORDER — LATANOPROST 0.005 % OP SOLN
OPHTHALMIC | Status: AC
  Filled 2017-05-02: qty 2.5

## 2017-05-02 MED ORDER — METRONIDAZOLE IN NACL 5-0.79 MG/ML-% IV SOLN
500.0000 mg | Freq: Three times a day (TID) | INTRAVENOUS | Status: DC
  Administered 2017-05-03 – 2017-05-05 (×7): 500 mg via INTRAVENOUS
  Filled 2017-05-02 (×7): qty 100

## 2017-05-02 MED ORDER — MORPHINE SULFATE (PF) 2 MG/ML IV SOLN
2.0000 mg | INTRAVENOUS | Status: DC | PRN
  Administered 2017-05-02 – 2017-05-03 (×6): 2 mg via INTRAVENOUS
  Filled 2017-05-02 (×6): qty 1

## 2017-05-02 MED ORDER — TRAMADOL-ACETAMINOPHEN 37.5-325 MG PO TABS
1.0000 | ORAL_TABLET | ORAL | Status: DC | PRN
  Administered 2017-05-04 – 2017-05-05 (×5): 1 via ORAL
  Filled 2017-05-02 (×5): qty 1

## 2017-05-02 MED ORDER — SODIUM CHLORIDE 0.9 % IV BOLUS (SEPSIS)
500.0000 mL | Freq: Once | INTRAVENOUS | Status: AC
  Administered 2017-05-02: 500 mL via INTRAVENOUS

## 2017-05-02 MED ORDER — APIXABAN 5 MG PO TABS
5.0000 mg | ORAL_TABLET | Freq: Two times a day (BID) | ORAL | Status: DC
  Administered 2017-05-02 – 2017-05-05 (×6): 5 mg via ORAL
  Filled 2017-05-02 (×2): qty 1
  Filled 2017-05-02: qty 2
  Filled 2017-05-02 (×3): qty 1

## 2017-05-02 MED ORDER — MORPHINE SULFATE (PF) 4 MG/ML IV SOLN
4.0000 mg | Freq: Once | INTRAVENOUS | Status: AC
  Administered 2017-05-02: 4 mg via INTRAVENOUS
  Filled 2017-05-02: qty 1

## 2017-05-02 MED ORDER — ONDANSETRON HCL 4 MG PO TABS: 4.0000 mg | ORAL_TABLET | Freq: Four times a day (QID) | ORAL | Status: DC | PRN

## 2017-05-02 MED ORDER — IOPAMIDOL (ISOVUE-300) INJECTION 61%
100.0000 mL | Freq: Once | INTRAVENOUS | Status: AC | PRN
  Administered 2017-05-02: 100 mL via INTRAVENOUS

## 2017-05-02 MED ORDER — LATANOPROST 0.005 % OP SOLN
1.0000 [drp] | Freq: Every day | OPHTHALMIC | Status: DC
  Administered 2017-05-04: 1 [drp] via OPHTHALMIC
  Filled 2017-05-02: qty 2.5

## 2017-05-02 MED ORDER — SODIUM CHLORIDE 0.9 % IV BOLUS (SEPSIS)
1000.0000 mL | Freq: Once | INTRAVENOUS | Status: AC
  Administered 2017-05-02: 1000 mL via INTRAVENOUS

## 2017-05-02 MED ORDER — ALPRAZOLAM 0.25 MG PO TABS: 0.1250 mg | ORAL_TABLET | Freq: Two times a day (BID) | ORAL | Status: DC | PRN

## 2017-05-02 MED ORDER — ONDANSETRON HCL 4 MG/2ML IJ SOLN
4.0000 mg | Freq: Once | INTRAMUSCULAR | Status: DC
  Filled 2017-05-02: qty 2

## 2017-05-02 MED ORDER — BOOST / RESOURCE BREEZE PO LIQD
1.0000 | Freq: Three times a day (TID) | ORAL | Status: DC
  Administered 2017-05-03 – 2017-05-05 (×3): 1 via ORAL

## 2017-05-02 MED ORDER — ACETAMINOPHEN 650 MG RE SUPP: 650.0000 mg | Freq: Four times a day (QID) | RECTAL | Status: DC | PRN

## 2017-05-02 MED ORDER — DIPHENHYDRAMINE HCL 25 MG PO CAPS
25.0000 mg | ORAL_CAPSULE | Freq: Every day | ORAL | Status: DC
  Administered 2017-05-02 – 2017-05-04 (×3): 25 mg via ORAL
  Filled 2017-05-02 (×3): qty 1

## 2017-05-02 MED ORDER — CIPROFLOXACIN IN D5W 400 MG/200ML IV SOLN
400.0000 mg | Freq: Once | INTRAVENOUS | Status: AC
  Administered 2017-05-02: 400 mg via INTRAVENOUS
  Filled 2017-05-02: qty 200

## 2017-05-02 MED ORDER — ONDANSETRON HCL 4 MG/2ML IJ SOLN
4.0000 mg | Freq: Once | INTRAMUSCULAR | Status: AC
  Administered 2017-05-02: 4 mg via INTRAVENOUS
  Filled 2017-05-02: qty 2

## 2017-05-02 MED ORDER — CIPROFLOXACIN IN D5W 400 MG/200ML IV SOLN
400.0000 mg | Freq: Two times a day (BID) | INTRAVENOUS | Status: DC
  Administered 2017-05-03 – 2017-05-05 (×5): 400 mg via INTRAVENOUS
  Filled 2017-05-02 (×5): qty 200

## 2017-05-02 MED ORDER — DICYCLOMINE HCL 10 MG/ML IM SOLN
20.0000 mg | Freq: Once | INTRAMUSCULAR | Status: AC
  Administered 2017-05-02: 20 mg via INTRAMUSCULAR
  Filled 2017-05-02: qty 2

## 2017-05-02 MED ORDER — ONDANSETRON HCL 4 MG/2ML IJ SOLN
4.0000 mg | Freq: Four times a day (QID) | INTRAMUSCULAR | Status: DC | PRN
  Administered 2017-05-03: 4 mg via INTRAVENOUS
  Filled 2017-05-02: qty 2

## 2017-05-02 MED ORDER — VERAPAMIL HCL ER 240 MG PO TBCR
120.0000 mg | EXTENDED_RELEASE_TABLET | Freq: Two times a day (BID) | ORAL | Status: DC
  Administered 2017-05-02 – 2017-05-05 (×6): 120 mg via ORAL
  Filled 2017-05-02 (×6): qty 1

## 2017-05-02 MED ORDER — METRONIDAZOLE IN NACL 5-0.79 MG/ML-% IV SOLN
500.0000 mg | Freq: Once | INTRAVENOUS | Status: AC
  Administered 2017-05-02: 500 mg via INTRAVENOUS
  Filled 2017-05-02: qty 100

## 2017-05-02 MED ORDER — CYCLOSPORINE 0.05 % OP EMUL
1.0000 [drp] | Freq: Two times a day (BID) | OPHTHALMIC | Status: DC
  Administered 2017-05-02 – 2017-05-05 (×6): 1 [drp] via OPHTHALMIC
  Filled 2017-05-02 (×5): qty 1

## 2017-05-02 MED ORDER — PREDNISONE 10 MG PO TABS
5.0000 mg | ORAL_TABLET | ORAL | Status: DC
  Administered 2017-05-04: 5 mg via ORAL
  Filled 2017-05-02: qty 1

## 2017-05-02 MED ORDER — MORPHINE SULFATE (PF) 4 MG/ML IV SOLN: 4.0000 mg | Freq: Once | INTRAVENOUS | Status: DC

## 2017-05-02 MED ORDER — ALPRAZOLAM 0.25 MG PO TABS
0.2500 mg | ORAL_TABLET | Freq: Every evening | ORAL | Status: DC | PRN
  Administered 2017-05-02 – 2017-05-04 (×3): 0.25 mg via ORAL
  Filled 2017-05-02 (×3): qty 1

## 2017-05-02 MED ORDER — ALPRAZOLAM 0.25 MG PO TABS: 0.1250 mg | ORAL_TABLET | Freq: Every day | ORAL | Status: DC | PRN

## 2017-05-02 NOTE — ED Triage Notes (Signed)
Per EMS: Pt was seen on Wednesday at PCP for abd pain, had xray and showed large amount of stool.  Pt was given colace, bowel prep and mirilax.  Last [facility] Wednesday. Pt alert and oriented.

## 2017-05-02 NOTE — ED Provider Notes (Signed)
[facility] DEPT Provider Note   CSN: [id number] Arrival date & time: 05/02/17  2952     History   Chief Complaint Chief Complaint  Patient presents with  . Abdominal Pain    HPI [name] is a 76 y.o. female.  Patient presents with diffuse abdominal cramping and constipation. She was seen by her gastroenterologist on Wednesday and diagnosed with constipation. She was given several bowel prep medications with no formal bowel movement. No fever, sweats, chills, vomiting, diarrhea. Severity of symptoms is moderate to severe. Past medical history includes IBS and ectomy.      Past Medical History:  Diagnosis Date  . Asymmetric septal hypertrophy (HCC)   . Atrial fibrillation ([place])    with permanent pacemaker  . Blepharitis 10/2016   Dr.Groat  . Current use of long term anticoagulation   . Endometriosis   . Glaucoma   . H/O mitral valve repair   . 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 IBS (irritable bowel syndrome) 12/2002  . Irregular heart beat   . 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 Rib fracture 01/2017   Dr.Green  . Severe mitral regurgitation     Patient Active Problem List   Diagnosis Date Noted  . Sinus node dysfunction ([place]) 12/03/2011  . MITRAL REGURGITATION 12/07/2008  . 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 Atrial fibrillation ([place]) 12/07/2008  . PACEMAKER-[facility] 12/07/2008    Past Surgical History:  Procedure Laterality Date  . APPENDECTOMY  1952  . HYSTEROSCOPY  06/2002   Polyp; Small fibroid  . HYSTEROSCOPY  09/10/09   Myoma  . MITRAL VALVE REPAIR      (plication of posterior leaflet with 28-mm Medtronic CG Future  Band annuloplasty). (02/23/08  Dr. Roxy Manns)  . MYOMECTOMY     median sternotomy  . PACEMAKER INSERTION  02/2008  . TONSILLECTOMY AND ADENOIDECTOMY  1955  . TUBAL LIGATION  1983    OB History    Gravida Para Term Preterm  AB Living   2 1 1   1 1    SAB TAB Ectopic Multiple Live Births     1             Home Medications    Prior to Admission medications   Medication Sig Start Date End Date Taking? Authorizing Provider  ALPRAZolam (XANAX) 0.25 MG tablet Take 1/2 tablet (0.125 mg) in the morning and 1 tablet (0.25 mg) at bedtime   Yes [provider]  amoxicillin (AMOXIL) 500 MG tablet Take 2,000 mg by mouth as directed. Take 1 hour before dental procedures   Yes [provider]  Calcium Carbonate-Vitamin D (CALCIUM + D PO) Take 600 mg by mouth 2 (two) times daily.    Yes [provider]  cycloSPORINE (RESTASIS) 0.05 % ophthalmic emulsion Place 1 drop into both eyes 2 (two) times daily.   Yes [provider]  diclofenac sodium (VOLTAREN) 1 % GEL Apply 4 g topically 2 (two) times daily.   Yes [provider]  diphenhydrAMINE (BENADRYL) 25 mg capsule Take 25 mg by mouth at bedtime.   Yes [provider]  ELIQUIS 5 MG TABS tablet TAKE 1 TABLET BY MOUTH TWO  TIMES DAILY 04/17/17  Yes Deboraha Sprang, MD  Lactobacillus (ACIDOPHILUS PO) Take by mouth 2 (two) times daily.   Yes [provider]  latanoprost (XALATAN) 0.005 % ophthalmic solution Place 1 drop into  both eyes at bedtime.   Yes [provider]  loperamide (IMODIUM) 2 MG capsule Take 4 mg by mouth 3 (three) times daily as needed for diarrhea or loose stools.   Yes [provider]  magnesium oxide (MAG-OX) 400 MG tablet Take 1 tablet (400 mg total) by mouth 2 (two) times daily. 08/28/15  Yes Deboraha Sprang, MD  predniSONE (DELTASONE) 10 MG tablet Take 5 mg by mouth 3 (three) times a week.   Yes [provider]  sulfacetamide (BLEPH-10) 10 % ophthalmic solution Place 1 drop into the left eye 4 (four) times daily.   Yes [provider]  valACYclovir (VALTREX) 500 MG tablet Take 500 mg by mouth every other day.    Yes [provider]  verapamil (CALAN-SR)  120 MG CR tablet Take 1 tablet (120 mg total) by mouth daily. Patient taking differently: Take 120 mg by mouth 2 (two) times daily.  03/19/17  Yes Deboraha Sprang, MD  traMADol-acetaminophen (ULTRACET) 37.5-325 MG tablet Take by mouth as needed. 03/05/17   [provider]    Family History Family History  Problem Relation Age of Onset  . Breast cancer Mother 19  . Breast cancer Maternal Grandmother 49  . Coronary artery disease Brother        n his 60s with triple bypass   . Coronary artery disease Brother        with 2 vessel coronary disease at age 56  . Heart attack Father     Social History Social History  Substance Use Topics  . Smoking status: Former Research scientist (life sciences)  . Smokeless tobacco: Never Used     Comment: QUIT 40 YEARS AGO  . Alcohol use 3.0 - 3.6 oz/week    5 - 6 Glasses of wine per week     Allergies   Amiodarone and Doxycycline   Review of Systems Review of Systems  All other systems reviewed and are negative.    Physical Exam Updated Vital Signs BP (!) 159/57   Pulse 72   Temp 98.4 F (36.9 C) (Oral)   Resp 18   Ht 5' (1.524 m)   Wt 52.6 kg (116 lb)   SpO2 95%   BMI 22.65 kg/m   Physical Exam  Constitutional: She is oriented to person, place, and time. She appears well-developed and well-nourished.  HENT:  Head: Normocephalic and atraumatic.  Eyes: Conjunctivae are normal.  Neck: Neck supple.  Cardiovascular: Normal rate and regular rhythm.   Pulmonary/Chest: Effort normal and breath sounds normal.  Abdominal:  Diffuse minimal tenderness  Genitourinary:  Genitourinary Comments: Rectal exam: No masses or stool palpated. No stool on glove.  Musculoskeletal: Normal range of motion.  Neurological: She is alert and oriented to person, place, and time.  Skin: Skin is warm and dry.  Psychiatric: She has a normal mood and affect. Her behavior is normal.  Nursing note and vitals reviewed.    ED Treatments / Results  Labs (all labs ordered  are listed, but only abnormal results are displayed) Labs Reviewed  COMPREHENSIVE METABOLIC PANEL - Abnormal; Notable for the following:       Result Value   Chloride 98 (*)    Glucose, Bld 109 (*)    Total Bilirubin 1.3 (*)    All other components within normal limits  CBC - Abnormal; Notable for the following:    WBC 11.9 (*)    All other components within normal limits  URINALYSIS, ROUTINE W REFLEX MICROSCOPIC -  Abnormal; Notable for the following:    APPearance HAZY (*)    Hgb urine dipstick MODERATE (*)    Ketones, ur 20 (*)    Leukocytes, UA TRACE (*)    Bacteria, UA RARE (*)    Squamous Epithelial / LPF 0-5 (*)    All other components within normal limits  LIPASE, BLOOD    EKG  EKG Interpretation None       Radiology Ct Abdomen Pelvis W Contrast  Result Date: 05/02/2017 CLINICAL DATA:  Constipation.  Abdominal pain. EXAM: CT ABDOMEN AND PELVIS WITH CONTRAST TECHNIQUE: Multidetector CT imaging of the abdomen and pelvis was performed using the standard protocol following bolus administration of intravenous contrast. CONTRAST:  112mL ISOVUE-300 IOPAMIDOL (ISOVUE-300) INJECTION 61% COMPARISON:  04/29/2017 abdominal radiographs. 05/01/2007 CT abdomen/ pelvis. FINDINGS: Lower chest: Nonspecific mild patchy subpleural reticulation at both lung bases. Pacer leads are partially visualized terminating in the right atrium and right ventricular apex. Mitral annuloplasty ring is in place. Healed deformities in the lateral left sixth and seventh ribs. Hepatobiliary: Normal liver with no liver mass. Normal gallbladder with no radiopaque cholelithiasis. No biliary ductal dilatation. Pancreas: Normal, with no mass or duct dilation. Spleen: Normal size. No mass. Adrenals/Urinary Tract: Normal adrenals. No hydronephrosis. 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 Additional scattered subcentimeter hypodense renal cortical lesions in both  kidneys are too small to characterize and require no further follow-up. Normal bladder. Stomach/Bowel: Small hiatal hernia. Otherwise collapsed and grossly normal stomach. Normal caliber small bowel with no small bowel wall thickening. Appendectomy. There is marked wall thickening throughout the sigmoid colon and rectum with associated mucosal hyperenhancement and prominent pericolonic fat stranding and ill-defined fluid. There is diffuse moderate distention of the cecum, ascending colon, hepatic flexure, transverse colon, splenic flexure and descending colon with associated liquid stool. Vascular/Lymphatic: Atherosclerotic nonaneurysmal abdominal aorta. Patent portal, splenic, hepatic and renal veins. No pathologically enlarged lymph nodes in the abdomen or pelvis. Reproductive: Enlarged myomatous uterus with scattered calcifications within several degenerated fibroids, overall decreased in size since 2008. No adnexal masses . Other: Fat stranding and minimal ill-defined fluid in the presacral space. No focal fluid collections. No pneumoperitoneum. Musculoskeletal: No aggressive appearing focal osseous lesions. Moderate T10 vertebral compression fracture is of indeterminate chronicity, new since 05/01/2007 CT. Mild thoracolumbar spondylosis . IMPRESSION: 1. CT findings are most compatible with a nonspecific infectious or inflammatory proctocolitis predominantly involving the sigmoid colon and rectum, with the differential including C. diff colitis. Liquid stool levels and moderate distention of the proximal 2/3 of the colon compatible with adynamic ileus . 2. No evidence of small-bowel obstruction. No perforation or abscess. 3. Moderate T10 vertebral compression fracture of indeterminate chronicity, new since 2008 CT. 4. Small hiatal hernia. 5.  Aortic Atherosclerosis (ICD10-I70.0). Electronically Signed   By: Ilona Sorrel M.D.   On: 05/02/2017 14:41    Procedures Procedures (including critical care  time)  Medications Ordered in ED Medications  ondansetron (ZOFRAN) injection 4 mg (not administered)  morphine 4 MG/ML injection 4 mg (not administered)  ondansetron (ZOFRAN) injection 4 mg (4 mg Intravenous Given 05/02/17 0910)  sodium chloride 0.9 % bolus 500 mL (0 mLs Intravenous Stopped 05/02/17 1102)  morphine 4 MG/ML injection 4 mg (4 mg Intravenous Given 05/02/17 0910)  sodium chloride 0.9 % bolus 1,000 mL (1,000 mLs Intravenous New Bag/Given 05/02/17 1430)  iopamidol (ISOVUE-300) 61 % injection 100 mL (100 mLs Intravenous Contrast Given 05/02/17 1344)  acetaminophen (  TYLENOL) tablet 1,000 mg (1,000 mg Oral Given 05/02/17 1430)     Initial Impression / Assessment and Plan / ED Course  I have reviewed the triage vital signs and the nursing notes.  Pertinent labs & imaging results that were available during my care of the patient were reviewed by me and considered in my medical decision making (see chart for details).     CT scan reveals a proctocolitis. Will start IV antibiotics and admit to general medicine.  Final Clinical Impressions(s) / ED Diagnoses   Final diagnoses:  Colitis    New Prescriptions New Prescriptions   No medications on file     Nat Christen, MD 05/02/17 1459

## 2017-05-02 NOTE — H&P (Addendum)
History and Physical  [name] IRC:[id number] DOB: 1940/11/19 DOA: 05/02/2017  Referring physician: Dr Lacinda Axon, ED physician PCP: Levin Erp, MD  Outpatient Specialists:   Dr Caryl Comes (Cards)  [name]  Patient Coming From: Home  Chief Complaint: abdominal pain  HPI: [name] is a 76 y.o. female with a history of A. FIb (Chads 2 vasc score of 3), GERD, on anticoagulation, IBS, severe mitral valve regurgitation. Patient's is a patient of [facility]. She has a history of IBS with diarrhea and takes Imodium. Over the past week, the patient has had constipation necessitating MiraLAX. However, over the past 48 hours, she has had increased abdominal pain with small amounts of liquid stool. Patient did get an enema today by the ER physician, then CT scan showed proctocolitis. She denies fevers, chills, nausea, vomiting, bloody stools, dark black stools.. Morphine does help with the abdominal pain, movement worsens the pain.  Emergency Department Course: Patient received Cipro and Flagyl and IV fluids.  Review of Systems:   Pt denies any fevers, chills, nausea, vomiting, diarrhea, constipation, abdominal pain, shortness of breath, dyspnea on exertion, orthopnea, cough, wheezing, palpitations, headache, vision changes, lightheadedness, dizziness, melena, rectal bleeding.  Review of systems are otherwise negative  Past Medical History:  Diagnosis Date  . Asymmetric septal hypertrophy (HCC)   . Atrial fibrillation ([place])    with permanent pacemaker  . Blepharitis 10/2016   Dr.Groat  . Current use of long term anticoagulation   . Endometriosis   . Glaucoma   . H/O mitral valve repair   . 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 IBS (irritable bowel syndrome) 12/2002  . Irregular heart beat   . 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 Rib fracture 01/2017   Dr.Green  . Severe mitral regurgitation    Past Surgical  History:  Procedure Laterality Date  . APPENDECTOMY  1952  . HYSTEROSCOPY  06/2002   Polyp; Small fibroid  . HYSTEROSCOPY  09/10/09   Myoma  . MITRAL VALVE REPAIR      (plication of posterior leaflet with 28-mm Medtronic CG Future  Band annuloplasty). (02/23/08  Dr. Roxy Manns)  . MYOMECTOMY     median sternotomy  . PACEMAKER INSERTION  02/2008  . TONSILLECTOMY AND ADENOIDECTOMY  1955  . TUBAL LIGATION  1983   Social History:  reports that she has quit smoking. She has never used smokeless tobacco. She reports that she drinks about 3.0 - 3.6 oz of alcohol per week . She reports that she does not use drugs. Patient lives at [name]  . Amiodarone     Severe rash and upset stomach  . Doxycycline     Family History  Problem Relation Age of Onset  . Breast cancer Mother 45  . Breast cancer Maternal Grandmother 80  . Coronary artery disease Brother        n his 39s with triple bypass   . Coronary artery disease Brother        with 2 vessel coronary disease at age 49  . Heart attack Father       Prior to Admission medications   Medication Sig Start Date End Date Taking? Authorizing Provider  ALPRAZolam (XANAX) 0.25 MG tablet Take 1/2 tablet (0.125 mg) in the morning and 1 tablet (0.25 mg) at bedtime   Yes [provider]  amoxicillin (AMOXIL) 500 MG tablet  Take 2,000 mg by mouth as directed. Take 1 hour before dental procedures   Yes [provider]  Calcium Carbonate-Vitamin D (CALCIUM + D PO) Take 600 mg by mouth 2 (two) times daily.    Yes [provider]  cycloSPORINE (RESTASIS) 0.05 % ophthalmic emulsion Place 1 drop into both eyes 2 (two) times daily.   Yes [provider]  diclofenac sodium (VOLTAREN) 1 % GEL Apply 4 g topically 2 (two) times daily.   Yes [provider]  diphenhydrAMINE (BENADRYL) 25 mg capsule Take 25 mg by mouth at bedtime.   Yes [provider]  ELIQUIS 5 MG TABS tablet TAKE 1  TABLET BY MOUTH TWO  TIMES DAILY 04/17/17  Yes Deboraha Sprang, MD  Lactobacillus (ACIDOPHILUS PO) Take by mouth 2 (two) times daily.   Yes [provider]  latanoprost (XALATAN) 0.005 % ophthalmic solution Place 1 drop into both eyes at bedtime.   Yes [provider]  loperamide (IMODIUM) 2 MG capsule Take 4 mg by mouth 3 (three) times daily as needed for diarrhea or loose stools.   Yes [provider]  magnesium oxide (MAG-OX) 400 MG tablet Take 1 tablet (400 mg total) by mouth 2 (two) times daily. 08/28/15  Yes Deboraha Sprang, MD  predniSONE (DELTASONE) 10 MG tablet Take 5 mg by mouth 3 (three) times a week.   Yes [provider]  sulfacetamide (BLEPH-10) 10 % ophthalmic solution Place 1 drop into the left eye 4 (four) times daily.   Yes [provider]  valACYclovir (VALTREX) 500 MG tablet Take 500 mg by mouth every other day.    Yes [provider]  verapamil (CALAN-SR) 120 MG CR tablet Take 1 tablet (120 mg total) by mouth daily. Patient taking differently: Take 120 mg by mouth 2 (two) times daily.  03/19/17  Yes Deboraha Sprang, MD  traMADol-acetaminophen (ULTRACET) 37.5-325 MG tablet Take by mouth as needed. 03/05/17   [provider]    Physical Exam: BP 131/60   Pulse 75   Temp 98.4 F (36.9 C) (Oral)   Resp 18   Ht 5' (1.524 m)   Wt 52.6 kg (116 lb)   SpO2 96%   BMI 22.65 kg/m   General: Older Caucasian female. Awake and alert and oriented x3. No acute cardiopulmonary distress.  HEENT: Normocephalic atraumatic.  Right and left ears normal in appearance.  Pupils equal, round, reactive to light. Extraocular muscles are intact. Sclerae anicteric and noninjected.  Moist mucosal membranes. No mucosal lesions.  Neck: Neck supple without lymphadenopathy. No carotid bruits. No masses palpated.  Cardiovascular: Regular rate with normal S1-S2 sounds. No murmurs, rubs, gallops auscultated. No JVD.  Respiratory: Good respiratory  effort with no wheezes, rales, rhonchi. Lungs clear to auscultation bilaterally.  No accessory muscle use. Abdomen: Soft, tender particularly on the right upper and lower abdomens. Mild rebound tenderness noted. Slightly distended. Active bowel sounds. No masses or hepatosplenomegaly  Skin: No rashes, lesions, or ulcerations.  Dry, warm to touch. 2+ dorsalis pedis and radial pulses. Musculoskeletal: No calf or leg pain. All major joints not erythematous nontender.  No upper or lower joint deformation.  Good ROM.  No contractures  Psychiatric: Intact judgment and insight. Pleasant and cooperative. Neurologic: No focal neurological deficits. Strength is 5/5 and symmetric in upper and lower extremities.  Cranial nerves II through XII are grossly intact.           Labs on Admission: I have personally  reviewed following labs and imaging studies  CBC:  Recent Labs Lab 05/02/17 0841  WBC 11.9*  HGB 14.7  HCT 43.2  MCV 93.1  PLT 622   Basic Metabolic Panel:  Recent Labs Lab 05/02/17 0841  NA 136  K 3.8  CL 98*  CO2 27  GLUCOSE 109*  BUN 17  CREATININE 0.72  CALCIUM 9.3   GFR: Estimated Creatinine Clearance: 43.6 mL/min (by C-G formula based on SCr of 0.72 mg/dL). Liver Function Tests:  Recent Labs Lab 05/02/17 0841  AST 21  ALT 20  ALKPHOS 67  BILITOT 1.3*  PROT 7.4  ALBUMIN 3.9    Recent Labs Lab 05/02/17 0841  LIPASE 24   No results for input(s): AMMONIA in the last 168 hours. Coagulation Profile: No results for input(s): INR, PROTIME in the last 168 hours. Cardiac Enzymes: No results for input(s): CKTOTAL, CKMB, CKMBINDEX, TROPONINI in the last 168 hours. BNP (last 3 results) No results for input(s): PROBNP in the last 8760 hours. HbA1C: No results for input(s): HGBA1C in the last 72 hours. CBG: No results for input(s): GLUCAP in the last 168 hours. Lipid Profile: No results for input(s): CHOL, HDL, LDLCALC, TRIG, CHOLHDL, LDLDIRECT in the last 72  hours. Thyroid Function Tests: No results for input(s): TSH, T4TOTAL, FREET4, T3FREE, THYROIDAB in the last 72 hours. Anemia Panel: No results for input(s): VITAMINB12, FOLATE, FERRITIN, TIBC, IRON, RETICCTPCT in the last 72 hours. Urine analysis:    Component Value Date/Time   COLORURINE YELLOW 05/02/2017 1010   APPEARANCEUR HAZY (A) 05/02/2017 1010   LABSPEC 1.014 05/02/2017 1010   PHURINE 6.0 05/02/2017 1010   GLUCOSEU NEGATIVE 05/02/2017 1010   HGBUR MODERATE (A) 05/02/2017 1010   BILIRUBINUR NEGATIVE 05/02/2017 1010   KETONESUR 20 (A) 05/02/2017 1010   PROTEINUR NEGATIVE 05/02/2017 1010   UROBILINOGEN 0.2 03/01/2008 1201   NITRITE NEGATIVE 05/02/2017 1010   LEUKOCYTESUR TRACE (A) 05/02/2017 1010   Sepsis Labs: @LABRCNTIP (procalcitonin:4,lacticidven:4) )No results found for this or any previous visit (from the past 240 hour(s)).   Radiological Exams on Admission: Ct Abdomen Pelvis W Contrast  Result Date: 05/02/2017 CLINICAL DATA:  Constipation.  Abdominal pain. EXAM: CT ABDOMEN AND PELVIS WITH CONTRAST TECHNIQUE: Multidetector CT imaging of the abdomen and pelvis was performed using the standard protocol following bolus administration of intravenous contrast. CONTRAST:  160mL ISOVUE-300 IOPAMIDOL (ISOVUE-300) INJECTION 61% COMPARISON:  04/29/2017 abdominal radiographs. 05/01/2007 CT abdomen/ pelvis. FINDINGS: Lower chest: Nonspecific mild patchy subpleural reticulation at both lung bases. Pacer leads are partially visualized terminating in the right atrium and right ventricular apex. Mitral annuloplasty ring is in place. Healed deformities in the lateral left sixth and seventh ribs. Hepatobiliary: Normal liver with no liver mass. Normal gallbladder with no radiopaque cholelithiasis. No biliary ductal dilatation. Pancreas: Normal, with no mass or duct dilation. Spleen: Normal size. No mass. Adrenals/Urinary Tract: Normal adrenals. No hydronephrosis. 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 Additional scattered subcentimeter hypodense renal cortical lesions in both kidneys are too small to characterize and require no further follow-up. Normal bladder. Stomach/Bowel: Small hiatal hernia. Otherwise collapsed and grossly normal stomach. Normal caliber small bowel with no small bowel wall thickening. Appendectomy. There is marked wall thickening throughout the sigmoid colon and rectum with associated mucosal hyperenhancement and prominent pericolonic fat stranding and ill-defined fluid. There is diffuse moderate distention of the cecum, ascending colon, hepatic flexure, transverse colon, splenic flexure and descending colon with associated liquid stool.  Vascular/Lymphatic: Atherosclerotic nonaneurysmal abdominal aorta. Patent portal, splenic, hepatic and renal veins. No pathologically enlarged lymph nodes in the abdomen or pelvis. Reproductive: Enlarged myomatous uterus with scattered calcifications within several degenerated fibroids, overall decreased in size since 2008. No adnexal masses . Other: Fat stranding and minimal ill-defined fluid in the presacral space. No focal fluid collections. No pneumoperitoneum. Musculoskeletal: No aggressive appearing focal osseous lesions. Moderate T10 vertebral compression fracture is of indeterminate chronicity, new since 05/01/2007 CT. Mild thoracolumbar spondylosis . IMPRESSION: 1. CT findings are most compatible with a nonspecific infectious or inflammatory proctocolitis predominantly involving the sigmoid colon and rectum, with the differential including C. diff colitis. Liquid stool levels and moderate distention of the proximal 2/3 of the colon compatible with adynamic ileus . 2. No evidence of small-bowel obstruction. No perforation or abscess. 3. Moderate T10 vertebral compression fracture of indeterminate chronicity, new since 2008 CT. 4. Small hiatal hernia. 5.  Aortic Atherosclerosis  (ICD10-I70.0). Electronically Signed   By: Ilona Sorrel M.D.   On: 05/02/2017 14:41    Assessment/Plan: Principal Problem:   Proctocolitis Active Problems:   Atrial fibrillation (HCC)   PACEMAKER-[place]   Chronic anticoagulation    This patient was discussed with the ED physician, including pertinent vitals, physical exam findings, labs, and imaging.  We also discussed care given by the ED provider.  #1 proctocolitis  Admits - I anticipate the patient needing 2 days in the hospital before she is ready for discharge.  Clear liquids  Cipro 400 mg twice a day and Flagyl 500 mg 3 times a day  IV fluids  CBC tomorrow  C. difficile and GI panel to rule out particular infectious etiologies of patient's colitis.  Enteric precautions #2 atrial fibrillation  Rate controlled  Continue Cardizem and eliquis #3 chronic anticoagulations  Continue eliquis #4 pacemaker  Normal pacing  DVT prophylaxis: Eliquis Consultants: None Code Status: Full code Family Communication: Boyfriend in the room  Disposition Plan: Patient should be able to return home following admission   Tonesha Tsou [place] [facility] Pager [phone]  If 7PM-7AM, please contact night-coverage [email or web address] Password [email or web address]

## 2017-05-02 NOTE — ED Notes (Signed)
Patient unable to void at this time

## 2017-05-03 DIAGNOSIS — I495 Sick sinus syndrome: Secondary | ICD-10-CM

## 2017-05-03 DIAGNOSIS — I421 Obstructive hypertrophic cardiomyopathy: Secondary | ICD-10-CM

## 2017-05-03 DIAGNOSIS — E876 Hypokalemia: Secondary | ICD-10-CM

## 2017-05-03 DIAGNOSIS — Z950 Presence of cardiac pacemaker: Secondary | ICD-10-CM

## 2017-05-03 DIAGNOSIS — K529 Noninfective gastroenteritis and colitis, unspecified: Secondary | ICD-10-CM

## 2017-05-03 DIAGNOSIS — Z7901 Long term (current) use of anticoagulants: Secondary | ICD-10-CM

## 2017-05-03 DIAGNOSIS — I480 Paroxysmal atrial fibrillation: Secondary | ICD-10-CM

## 2017-05-03 LAB — BASIC METABOLIC PANEL
Anion gap: 5 (ref 5–15)
BUN: 15 mg/dL (ref 6–20)
CO2: 24 mmol/L (ref 22–32)
CREATININE: 0.52 mg/dL (ref 0.44–1.00)
Calcium: 7.9 mg/dL — ABNORMAL LOW (ref 8.9–10.3)
Chloride: 106 mmol/L (ref 101–111)
GFR calc Af Amer: >60 mL/min (ref >60)
Glucose, Bld: 120 mg/dL — ABNORMAL HIGH (ref 65–99)
Potassium: 3.4 mmol/L — ABNORMAL LOW (ref 3.5–5.1)
SODIUM: 135 mmol/L (ref 135–145)

## 2017-05-03 LAB — CBC
HCT: 33 % — ABNORMAL LOW (ref 36.0–46.0)
Hemoglobin: 11 g/dL — ABNORMAL LOW (ref 12.0–15.0)
MCH: 31.4 pg (ref 26.0–34.0)
MCHC: 33.3 g/dL (ref 30.0–36.0)
MCV: 94.3 fL (ref 78.0–100.0)
PLATELETS: 222 10*3/uL (ref 150–400)
RBC: 3.5 MIL/uL — ABNORMAL LOW (ref 3.87–5.11)
RDW: 12.3 % (ref 11.5–15.5)
WBC: 8.1 10*3/uL (ref 4.0–10.5)

## 2017-05-03 MED ORDER — POTASSIUM CHLORIDE CRYS ER 20 MEQ PO TBCR
20.0000 meq | EXTENDED_RELEASE_TABLET | Freq: Once | ORAL | Status: AC
  Administered 2017-05-03: 20 meq via ORAL
  Filled 2017-05-03: qty 1

## 2017-05-03 MED ORDER — DICYCLOMINE HCL 10 MG PO CAPS
10.0000 mg | ORAL_CAPSULE | Freq: Three times a day (TID) | ORAL | Status: DC
  Administered 2017-05-03 – 2017-05-05 (×7): 10 mg via ORAL
  Filled 2017-05-03 (×7): qty 1

## 2017-05-03 MED ORDER — ALPRAZOLAM 0.25 MG PO TABS
0.1250 mg | ORAL_TABLET | Freq: Three times a day (TID) | ORAL | Status: DC | PRN
  Administered 2017-05-03 – 2017-05-04 (×2): 0.125 mg via ORAL
  Filled 2017-05-03 (×3): qty 1

## 2017-05-03 MED ORDER — HYDROMORPHONE HCL 1 MG/ML IJ SOLN
0.5000 mg | INTRAMUSCULAR | Status: DC | PRN
  Administered 2017-05-03 – 2017-05-04 (×3): 0.5 mg via INTRAVENOUS
  Filled 2017-05-03 (×3): qty 1

## 2017-05-03 MED ORDER — SIMETHICONE 80 MG PO CHEW
160.0000 mg | CHEWABLE_TABLET | Freq: Four times a day (QID) | ORAL | Status: DC | PRN
  Administered 2017-05-05: 160 mg via ORAL
  Filled 2017-05-03: qty 2

## 2017-05-03 MED ORDER — HYDROMORPHONE BOLUS VIA INFUSION: 0.5000 mg | Freq: Every day | INTRAVENOUS | Status: DC

## 2017-05-03 NOTE — Consult Note (Signed)
Referring Provider: No ref. provider found Primary Care Physician:  Levin Erp, MD Primary Gastroenterologist:  Dr Wallis Mart  Reason for Consultation:  Colitis.  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 Has been seeing Dr. Wallis Mart recently.  Multiple laxatives tried with relatively poor results. She had a Sitzmarks marker study recently which came back normal. No nausea or vomiting, fever or chills. She's not had any rectal bleeding. Presented to the ED and saw Dr. Lacinda Axon.  White count of 11.9 hemoglobin 14.7.  Rectal exam revealed no stool in the rectal vault.GIP and C. Difficile have been ordered.  Contrast CT scan of abdomen and pelvis demonstrated mildly dilated liquid filled colon with. Colonic fat stranding.  No advanced atherosclerotic lesions are seen.  T10 compression fracture also noted-age unknown.  Patient has been admitted to the hospital and started on IV Cipro and Flagyl empirically stool studies are pending. It is worth noting that her cardiologist started her on verapamil several weeks ago.  Her big concern currently is waxing and waning abdominal cramping-states sometimes as severe as 10 out of 10 breaking through morphine 3 mg IV every 3 hours.  Patient tells me a complete colonoscopy could not be completed previously because of anatomy;  FS and air-contrast barium enema over 5 years ago.  Patient denies any sick contact attacks. No unusual travel recently. Chronically anticoagulated secondary to cardiac issues.   Past Medical History:  Diagnosis Date  . Asymmetric septal hypertrophy (HCC)   . Atrial fibrillation ([place])    with permanent pacemaker  . Blepharitis 10/2016   Dr.Groat  . Current use of long term anticoagulation   . Endometriosis   . Glaucoma   . H/O mitral valve  repair   . 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 IBS (irritable bowel syndrome) 12/2002  . Irregular heart beat   . 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 Rib fracture 01/2017   Dr.Green  . Severe mitral regurgitation     Past Surgical History:  Procedure Laterality Date  . APPENDECTOMY  1952  . HYSTEROSCOPY  06/2002   Polyp; Small fibroid  . HYSTEROSCOPY  09/10/09   Myoma  . MITRAL VALVE REPAIR      (plication of posterior leaflet with 28-mm Medtronic CG Future  Band annuloplasty). (02/23/08  Dr. Roxy Manns)  . MYOMECTOMY     median sternotomy  . PACEMAKER INSERTION  02/2008  . TONSILLECTOMY AND ADENOIDECTOMY  1955  . TUBAL LIGATION  1983    Prior to Admission medications   Medication Sig Start Date End Date Taking? Authorizing Provider  ALPRAZolam (XANAX) 0.25 MG tablet Take 1/2 tablet (0.125 mg) in the morning and 1 tablet (0.25 mg) at bedtime   Yes [provider]  amoxicillin (AMOXIL) 500 MG tablet Take 2,000 mg by mouth as directed. Take 1 hour before dental procedures   Yes [provider]  Calcium Carbonate-Vitamin D (CALCIUM + D PO) Take 600 mg by mouth 2 (two) times daily.    Yes [provider]  cycloSPORINE (RESTASIS) 0.05 % ophthalmic emulsion Place 1 drop into both eyes 2 (two) times daily.   Yes [provider]  diclofenac sodium (VOLTAREN) 1 % GEL Apply 4 g topically 2 (two)  times daily.   Yes [provider]  diphenhydrAMINE (BENADRYL) 25 mg capsule Take 25 mg by mouth at bedtime.   Yes [provider]  ELIQUIS 5 MG TABS tablet TAKE 1 TABLET BY MOUTH TWO  TIMES DAILY 04/17/17  Yes Deboraha Sprang, MD  Lactobacillus (ACIDOPHILUS PO) Take by mouth 2 (two) times daily.   Yes [provider]  latanoprost (XALATAN) 0.005 % ophthalmic solution Place 1 drop into both eyes at bedtime.   Yes [provider]  loperamide (IMODIUM) 2 MG  capsule Take 4 mg by mouth 3 (three) times daily as needed for diarrhea or loose stools.   Yes [provider]  magnesium oxide (MAG-OX) 400 MG tablet Take 1 tablet (400 mg total) by mouth 2 (two) times daily. 08/28/15  Yes Deboraha Sprang, MD  predniSONE (DELTASONE) 10 MG tablet Take 5 mg by mouth 3 (three) times a week.   Yes [provider]  sulfacetamide (BLEPH-10) 10 % ophthalmic solution Place 1 drop into the left eye 4 (four) times daily.   Yes [provider]  valACYclovir (VALTREX) 500 MG tablet Take 500 mg by mouth every other day.    Yes [provider]  verapamil (CALAN-SR) 120 MG CR tablet Take 1 tablet (120 mg total) by mouth daily. Patient taking differently: Take 120 mg by mouth 2 (two) times daily.  03/19/17  Yes Deboraha Sprang, MD  traMADol-acetaminophen (ULTRACET) 37.5-325 MG tablet Take by mouth as needed. 03/05/17   [provider]    Current Facility-Administered Medications  Medication Dose Route Frequency Provider Last Rate Last Dose  . 0.9 %  sodium chloride infusion   Intravenous Continuous Truett Mainland, DO 100 mL/hr at 05/03/17 0551    . acetaminophen (TYLENOL) tablet 650 mg  650 mg Oral Q6H PRN Truett Mainland, DO       Or  . acetaminophen (TYLENOL) suppository 650 mg  650 mg Rectal Q6H PRN Truett Mainland, DO      . ALPRAZolam Duanne Moron) tablet 0.125 mg  0.125 mg Oral TID PRN Orson Eva, MD   0.125 mg at 05/03/17 1005  . ALPRAZolam Duanne Moron) tablet 0.25 mg  0.25 mg Oral QHS PRN Truett Mainland, DO   0.25 mg at 05/02/17 2208  . apixaban (ELIQUIS) tablet 5 mg  5 mg Oral BID Truett Mainland, DO   5 mg at 05/03/17 1004  . ciprofloxacin (CIPRO) IVPB 400 mg  400 mg Intravenous Q12H Truett Mainland, DO   Stopped at 05/03/17 [phone]  . cycloSPORINE (RESTASIS) 0.05 % ophthalmic emulsion 1 drop  1 drop Both Eyes BID Truett Mainland, DO   1 drop at 05/03/17 1004  . diphenhydrAMINE (BENADRYL) capsule 25 mg  25 mg Oral QHS Truett Mainland, DO   25 mg at 05/02/17 2207  . feeding supplement (BOOST / RESOURCE BREEZE) liquid 1 Container  1 Container Oral TID BM Truett Mainland, DO   1 Container at 05/03/17 1000  . latanoprost (XALATAN) 0.005 % ophthalmic solution 1 drop  1 drop Both Eyes QHS Truett Mainland, DO      . metroNIDAZOLE (FLAGYL) IVPB 500 mg  500 mg Intravenous Q8H Truett Mainland, DO   Stopped at 05/03/17 0938  . morphine 2 MG/ML injection 2 mg  2 mg Intravenous Q3H PRN Truett Mainland, DO   2 mg at 05/03/17 1829  . ondansetron (ZOFRAN) tablet 4 mg  4 mg Oral Q6H  PRN Truett Mainland, DO       Or  . ondansetron [facility]) injection 4 mg  4 mg Intravenous Q6H PRN Truett Mainland, DO      . [START ON 05/04/2017] predniSONE (DELTASONE) tablet 5 mg  5 mg Oral Once per day on Mon Wed Fri Stinson, Jacob J, DO      . simethicone [facility]) chewable tablet 160 mg  160 mg Oral QID PRN Tat, Shanon Brow, MD      . traMADol-acetaminophen (ULTRACET) 37.5-325 MG per tablet 1 tablet  1 tablet Oral Q4H PRN Truett Mainland, DO      . verapamil (CALAN-SR) CR tablet 120 mg  120 mg Oral BID Truett Mainland, DO   120 mg at 05/03/17 1004    Allergies as of 05/02/2017 - Review Complete 05/02/2017  Allergen Reaction Noted  . Amiodarone  07/17/2015  . Doxycycline  03/12/2017    Family History  Problem Relation Age of Onset  . Breast cancer Mother 48  . Breast cancer Maternal Grandmother 18  . Coronary artery disease Brother        n his 66s with triple bypass   . Coronary artery disease Brother        with 2 vessel coronary disease at age 40  . Heart attack Father     Social History   Social History  . Marital status: Married    Spouse name: N/A  . Number of children: N/A  . Years of education: N/A   Occupational History  . Not on file.   Social History Main Topics  . Smoking status: Former Research scientist (life sciences)  . Smokeless tobacco: Never Used     Comment: QUIT 40 YEARS AGO  . Alcohol use 3.0 - 3.6 oz/week    5 - 6 Glasses of  wine per week  . Drug use: No  . Sexual activity: Yes    Partners: Male    Birth control/ protection: Post-menopausal   Other Topics Concern  . Not on file   Social History Narrative   Family History:   Last updated: 12/07/2008   Is remarkable for brother in his 30s with triple bypass and another brother with 2 vessel coronary disease at age 82.  Her father died at a later age with a myocardial infarction.       Social History:   Last updated: 12/07/2008   The patient is married and lives here in [place] with her husband.  They have 1 grown child.  She works full-time and as an Network engineer at [employer].  Her follow is Dr. Mendel Ryder.  She is a nonsmoker, although she did smoke briefly during her youth and quit smoking during her 40s.  She denies excessive alcohol consumption.           Review of Systems:  As in history of present illness.  Physical Exam: Vital signs in last 24 hours: Temp:  [98.1 F (36.7 C)-98.3 F (36.8 C)] 98.3 F (36.8 C) (08/19 0516) Pulse Rate:  [70-80] 78 (08/19 0516) Resp:  [17-18] 17 (08/19 0516) BP: (124-161)/(48-81) 124/49 (08/19 0516) SpO2:  [93 %-97 %] 94 % (08/19 0516) Weight:  [116 lb (52.6 kg)] 116 lb (52.6 kg) (08/18 1827) Last BM Date: 05/02/17 General:   Alert,  pleasant and cooperative in NAD.  Does not appear toxic.  Lungs:  Clear throughout to auscultation.   No wheezes, crackles, or rhonchi. No acute distress. Heart:  Regular rate and rhythm; no murmurs,  clicks, rubs,  or gallops. Abdomen: Full.  Bowel sounds present but infrequent.  Mild diffuse tenderness to palpation without obvious mass or organomegaly. No rebound tenderness. Rectal: done by EDP    Intake/Output from previous day: 08/18 0701 - 08/19 0700 In: 2939.7 [P.O.:240; I.V.:1099.7; IV Piggyback:1600] Out: 750 [Urine:750] Intake/Output this shift: No intake/output data recorded.  Lab Results:  Recent Labs  05/02/17 0841 05/03/17 0653  WBC 11.9*  8.1  HGB 14.7 11.0*  HCT 43.2 33.0*  PLT 259 222   BMET  Recent Labs  05/02/17 0841 05/03/17 0653  NA 136 135  K 3.8 3.4*  CL 98* 106  CO2 27 24  GLUCOSE 109* 120*  BUN 17 15  CREATININE 0.72 0.52  CALCIUM 9.3 7.9*   LFT  Recent Labs  05/02/17 0841  PROT 7.4  ALBUMIN 3.9  AST 21  ALT 20  ALKPHOS 47  BILITOT 1.3*    Impression:  Very pleasant 76 year old lady with long-standing IBS-D now admitted to the hospital with a 2 week history of apparent constipation/obstipation along with worsening abdominal pain. CT changes consistent with the inflammation of the rectum and colon as outlined above.  Long-standing diarrhea at baseline requiring daily antidiarrheal agents. It's interesting that she was started on verapamil recently which could also negatively impact on colonic motility. There is upheaval suggested in and around her colon on CT - non-specific in this setting. If she had an inflammatory/infectious colitis,  I would expect worsening of diarrhea and possibly bleeding.  Moreover, based on CT and rectal exam, an impaction does not appear to be playing a role either. Ischemia would remain in the differential at this time although presentation would be quite atypical.  She is already been started on Cipro and Flagyl empirically  Pain inadequately controlled on morphine  Recommendations:  Close observation.  Continue IV Cipro and Flagyl for now. Agree with GIP and C. difficile assay.    Will fine tune analgesic regimen for better pain control minimizing negative effects on gut motility (We'll switch morphine to Dilaudid 0.5 mg every 4 hours - discussed with pharmacy).  We will follow with you.              Notice:  This dictation was prepared with Dragon dictation along with smaller phrase technology. Any transcriptional errors that result from this process are unintentional and may not be corrected upon review.

## 2017-05-03 NOTE — Progress Notes (Signed)
PROGRESS NOTE  [name]:[id number] DOB: 11/21/1940 DOA: 05/02/2017 PCP: Levin Erp, MD  Brief History:  76 year old female with a history of diarrhea predominant irritable bowel syndrome, atrial fibrillation on apixaban, mitral valve annuloplasty, GERD, and permanent pacemaker presented with one-week history of constipation and abdominal pain. The patient went to see her GI physician, Dr. Cristina Gong, on 04/29/2017. The patient tried bisacodyl suppositories, Colace, MiraLAX with minimal improvement. She ultimately was prescribed some type of bowel prep on 05/01/2017. She states that she threw trying to drink the prep. She has had some small amounts of liquid stool associated with her cathartics, but she denies any hematochezia, melena. Because of worsening abdominal pain, the patient presented to emergency department for further evaluation. She states that she usually takes loperamide on daily basis for her diarrhea predominant IBS with her last dose approximately one week prior to this admission. She feels that the onset of constipation may be related to initiation of verapamil which was started by her cardiologist after her office visit on 03/19/2017. The patient has a some nausea without emesis. She denies any fevers, chills, chest pain, shortness breath. The patient denies any recent travels, exotic foods, or sick contacts. CT of the abdomen and pelvis after admission revealed sigmoid colon and rectal wall thickening with pericolonic fat stranding and ill-defined fluid. WBC was noted to be 11.9, but the patient has been afebrile and hemodynamically stable patient was started on ciprofloxacin and metronidazole.  Assessment/Plan: Proctocolitis -Continue ciprofloxacin and metronidazole -Continue clinical liquid diet for now -GI consult -Patient states that she had sigmoidoscopy and barium enema greater than 5 years ago results unknown -05/02/2017 CT abdomen pelvis--sigmoid and  rectal wall thickening with pericolonic fat stranding. Diffuse moderate distention of the cecum, ascending colon, transverse colon, and descending colon with associated liquid stool consistent with adynamic ileus; fat stranding in presacral space -04/29/2017 abdominal x-ray--large amount of stool -stool pathogen panel  Paroxysmal atrial fibrillation -Presently in sinus rhythm -Continue apixaban  -Continue verapamil for now   sinus node dysfunction -Status post permanent pacemaker  Hypertrophic cardiomyopathy -Status post mitral myectomy, mitral valve repair/annuloplasty   Anxiety  -Continue home dose of alprazolam, but increase to 3 times a day when necessary   Hypokalemia -replete -check mag   Disposition Plan:   Home in 1-2 days  Family Communication:   No Family at bedside--Total time spent 35 minutes.  Greater than 50% spent face to face counseling and coordinating care.   Consultants:  GI--Dr. Gala Romney  Code Status:  FULL  DVT Prophylaxis:  apixaban   Procedures: As Listed in Progress Note Above  Antibiotics: cipro 8/18>>> Metronidazole 8/18>>>    Subjective: Patient continued to have some loose stools overnight. She denies any hematochezia, melena. She feels there is a lot of gas pressure or abdomen. She is passing flatus. She denies any fevers, chest, chest pain, shortness breath, nausea, vomiting, dysuria, hematuria.   Objective: Vitals:   05/02/17 1700 05/02/17 1827 05/02/17 2140 05/03/17 0516  BP: (!) 147/81 (!) 161/52 (!) 148/48 (!) 124/49  Pulse: 71 80 75 78  Resp:  17 18 17   Temp:  98.1 F (36.7 C) 98.2 F (36.8 C) 98.3 F (36.8 C)  TempSrc:  Oral Oral Oral  SpO2: 93% 97% 96% 94%  Weight:  52.6 kg (116 lb)    Height:        Intake/Output Summary (Last 24 hours) at 05/03/17 9937 Last data filed at  05/03/17 0700  Gross per 24 hour  Intake          2939.67 ml  Output              750 ml  Net          2189.67 ml   Weight change:   Exam:   General:  Pt is alert, follows commands appropriately, not in acute distress  HEENT: No icterus, No thrush, No neck mass, [place]/AT  Cardiovascular: RRR, S1/S2, no rubs, no gallops  Respiratory: CTA bilaterally, no wheezing, no crackles, no rhonchi  Abdomen: Soft/+BS, non tender, non distended, no guarding  Extremities: No edema, No lymphangitis, No petechiae, No rashes, no synovitis   Data Reviewed: I have personally reviewed following labs and imaging studies Basic Metabolic Panel:  Recent Labs Lab 05/02/17 0841 05/03/17 0653  NA 136 135  K 3.8 3.4*  CL 98* 106  CO2 27 24  GLUCOSE 109* 120*  BUN 17 15  CREATININE 0.72 0.52  CALCIUM 9.3 7.9*   Liver Function Tests:  Recent Labs Lab 05/02/17 0841  AST 21  ALT 20  ALKPHOS 67  BILITOT 1.3*  PROT 7.4  ALBUMIN 3.9    Recent Labs Lab 05/02/17 0841  LIPASE 24   No results for input(s): AMMONIA in the last 168 hours. Coagulation Profile: No results for input(s): INR, PROTIME in the last 168 hours. CBC:  Recent Labs Lab 05/02/17 0841  WBC 11.9*  HGB 14.7  HCT 43.2  MCV 93.1  PLT 259   Cardiac Enzymes: No results for input(s): CKTOTAL, CKMB, CKMBINDEX, TROPONINI in the last 168 hours. BNP: Invalid input(s): POCBNP CBG: No results for input(s): GLUCAP in the last 168 hours. HbA1C: No results for input(s): HGBA1C in the last 72 hours. Urine analysis:    Component Value Date/Time   COLORURINE YELLOW 05/02/2017 1010   APPEARANCEUR HAZY (A) 05/02/2017 1010   LABSPEC 1.014 05/02/2017 1010   PHURINE 6.0 05/02/2017 1010   GLUCOSEU NEGATIVE 05/02/2017 1010   HGBUR MODERATE (A) 05/02/2017 1010   BILIRUBINUR NEGATIVE 05/02/2017 1010   KETONESUR 20 (A) 05/02/2017 1010   PROTEINUR NEGATIVE 05/02/2017 1010   UROBILINOGEN 0.2 03/01/2008 1201   NITRITE NEGATIVE 05/02/2017 1010   LEUKOCYTESUR TRACE (A) 05/02/2017 1010   Sepsis Labs: @LABRCNTIP (procalcitonin:4,lacticidven:4) )No results found for  this or any previous visit (from the past 240 hour(s)).   Scheduled Meds: . apixaban  5 mg Oral BID  . cycloSPORINE  1 drop Both Eyes BID  . diphenhydrAMINE  25 mg Oral QHS  . feeding supplement  1 Container Oral TID BM  . latanoprost  1 drop Both Eyes QHS  . [START ON 05/04/2017] predniSONE  5 mg Oral Once per day on Mon Wed Fri  . verapamil  120 mg Oral BID   Continuous Infusions: . sodium chloride 100 mL/hr at 05/03/17 0551  . ciprofloxacin Stopped (05/03/17 8416)  . metronidazole Stopped (05/03/17 6063)    Procedures/Studies: Dg Abd 1 View  Result Date: 04/08/2017 CLINICAL DATA:  Follow-up markers. EXAM: ABDOMEN - 1 VIEW COMPARISON:  04/06/2017 FINDINGS: All previously ingested markers have pass through the colon. Moderate amount of fecal matter is noted in the colon. Small bowel gas pattern is normal. Chronic degenerative changes of the spine. Chronic uterine calcifications in the pelvis. IMPRESSION: All previously ingested markers have passed. Electronically Signed   By: Nelson Chimes M.D.   On: 04/08/2017 12:51   Dg Abd 1 View  Result Date: 04/06/2017  CLINICAL DATA:  Sitz marker study day 1. EXAM: ABDOMEN - 1 VIEW COMPARISON:  Barium enema 02/22/2013 FINDINGS: Sitz markers are scattered throughout the colon from the proximal ascending colon to the distal transverse colon. Approximately 24 markers visualized. Moderate stool burden throughout the colon. No organomegaly. No free air or obstruction. IMPRESSION: Sitz marker scattered from the proximal ascending colon to the distal transverse colon. Moderate stool burden. Electronically Signed   By: Rolm Baptise M.D.   On: 04/06/2017 11:34   Ct Abdomen Pelvis W Contrast  Result Date: 05/02/2017 CLINICAL DATA:  Constipation.  Abdominal pain. EXAM: CT ABDOMEN AND PELVIS WITH CONTRAST TECHNIQUE: Multidetector CT imaging of the abdomen and pelvis was performed using the standard protocol following bolus administration of intravenous contrast.  CONTRAST:  134mL ISOVUE-300 IOPAMIDOL (ISOVUE-300) INJECTION 61% COMPARISON:  04/29/2017 abdominal radiographs. 05/01/2007 CT abdomen/ pelvis. FINDINGS: Lower chest: Nonspecific mild patchy subpleural reticulation at both lung bases. Pacer leads are partially visualized terminating in the right atrium and right ventricular apex. Mitral annuloplasty ring is in place. Healed deformities in the lateral left sixth and seventh ribs. Hepatobiliary: Normal liver with no liver mass. Normal gallbladder with no radiopaque cholelithiasis. No biliary ductal dilatation. Pancreas: Normal, with no mass or duct dilation. Spleen: Normal size. No mass. Adrenals/Urinary Tract: Normal adrenals. No hydronephrosis. 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 Additional scattered subcentimeter hypodense renal cortical lesions in both kidneys are too small to characterize and require no further follow-up. Normal bladder. Stomach/Bowel: Small hiatal hernia. Otherwise collapsed and grossly normal stomach. Normal caliber small bowel with no small bowel wall thickening. Appendectomy. There is marked wall thickening throughout the sigmoid colon and rectum with associated mucosal hyperenhancement and prominent pericolonic fat stranding and ill-defined fluid. There is diffuse moderate distention of the cecum, ascending colon, hepatic flexure, transverse colon, splenic flexure and descending colon with associated liquid stool. Vascular/Lymphatic: Atherosclerotic nonaneurysmal abdominal aorta. Patent portal, splenic, hepatic and renal veins. No pathologically enlarged lymph nodes in the abdomen or pelvis. Reproductive: Enlarged myomatous uterus with scattered calcifications within several degenerated fibroids, overall decreased in size since 2008. No adnexal masses . Other: Fat stranding and minimal ill-defined fluid in the presacral space. No focal fluid collections. No pneumoperitoneum.  Musculoskeletal: No aggressive appearing focal osseous lesions. Moderate T10 vertebral compression fracture is of indeterminate chronicity, new since 05/01/2007 CT. Mild thoracolumbar spondylosis . IMPRESSION: 1. CT findings are most compatible with a nonspecific infectious or inflammatory proctocolitis predominantly involving the sigmoid colon and rectum, with the differential including C. diff colitis. Liquid stool levels and moderate distention of the proximal 2/3 of the colon compatible with adynamic ileus . 2. No evidence of small-bowel obstruction. No perforation or abscess. 3. Moderate T10 vertebral compression fracture of indeterminate chronicity, new since 2008 CT. 4. Small hiatal hernia. 5.  Aortic Atherosclerosis (ICD10-I70.0). Electronically Signed   By: Ilona Sorrel M.D.   On: 05/02/2017 14:41   Dg Abd 2 Views  Result Date: 04/29/2017 CLINICAL DATA:  Abdominal pain with constipation. Recent Sitz marker study . EXAM: ABDOMEN - 2 VIEW COMPARISON:  04/08/2017. FINDINGS: Stool noted throughout the colon. Large amount of stool noted throughout the colon suggesting constipation. No bowel distention or free air. No Sitz markers noted. Lumbar spine scoliosis concave left. Diffuse degenerative change lumbar spine and both hips . IMPRESSION: Large amount of stool noted throughout the colon. Findings suggest constipation. No bowel distention or free air. No Sitz markers noted. Electronically  Signed   By: Marcello Moores  Register   On: 04/29/2017 13:24    Donesha Wallander, DO  [facility] Pager [phone]  If 7PM-7AM, please contact night-coverage [email or web address] Password [email or web address] 05/03/2017, 8:29 AM   LOS: 1 day

## 2017-05-03 NOTE — Progress Notes (Signed)
Stool sample sent. Per lab, stool was not liquid enough to test for C-diff, and not enough amount to test GI panel.

## 2017-05-04 DIAGNOSIS — I4891 Unspecified atrial fibrillation: Secondary | ICD-10-CM

## 2017-05-04 LAB — CBC
HCT: 33.2 % — ABNORMAL LOW (ref 36.0–46.0)
HEMOGLOBIN: 11.1 g/dL — AB (ref 12.0–15.0)
MCH: 31.7 pg (ref 26.0–34.0)
MCHC: 33.4 g/dL (ref 30.0–36.0)
MCV: 94.9 fL (ref 78.0–100.0)
PLATELETS: 211 10*3/uL (ref 150–400)
RBC: 3.5 MIL/uL — AB (ref 3.87–5.11)
RDW: 12.3 % (ref 11.5–15.5)
WBC: 7.5 10*3/uL (ref 4.0–10.5)

## 2017-05-04 LAB — MAGNESIUM: MAGNESIUM: 1.7 mg/dL (ref 1.7–2.4)

## 2017-05-04 LAB — BASIC METABOLIC PANEL
ANION GAP: 7 (ref 5–15)
BUN: 9 mg/dL (ref 6–20)
CALCIUM: 8.2 mg/dL — AB (ref 8.9–10.3)
CO2: 24 mmol/L (ref 22–32)
CREATININE: 0.59 mg/dL (ref 0.44–1.00)
Chloride: 107 mmol/L (ref 101–111)
Glucose, Bld: 106 mg/dL — ABNORMAL HIGH (ref 65–99)
Potassium: 3.5 mmol/L (ref 3.5–5.1)
Sodium: 138 mmol/L (ref 135–145)

## 2017-05-04 NOTE — Progress Notes (Signed)
Day shift nurse reported to me the inability to obtain enough stool for GI panel.    Patient had a very small amount of stool this morning around 4am and again the amount of stool was denied by lab, as not being sufficient to pain GI panel.  Stool characteristics were not compatible with C-diff.  Lab was unable to run C-diff also, as the stool was pasty and not loose stool.

## 2017-05-04 NOTE — Care Management Note (Signed)
Case Management Note  Patient Details  Name: [name] MRN: [id number] Date of Birth: 07-22-1941  Subjective/Objective:                  Admitted with proctocolitis. Pt is from home, lives with her husband. She is ind with ADL's. Has PCP, transportation and insurance with drug coverage. She has no HH or DME needs pta. No needs/concerns communicated.   Action/Plan: DC home with self care. CM will follow to DC and monitor for change to DC plan.   Expected Discharge Date:      05/06/2017            Expected Discharge Plan:  Home/Self Care  In-House Referral:  NA  Discharge planning Services  CM Consult  Post Acute Care Choice:  NA Choice offered to:  NA  Status of Service:  Completed, signed off  Sherald Barge, RN 05/04/2017, 12:20 PM

## 2017-05-04 NOTE — Progress Notes (Signed)
Subjective: Pain is improved in lower abdomen. No diarrhea. Having numerous soft stools. Unable to run Cdiff as stool was pasty this morning. Wants to try soft foods.   Objective: Vital signs in last 24 hours: Temp:  [97.4 F (36.3 C)-98.6 F (37 C)] 98 F (36.7 C) (08/20 0436) Pulse Rate:  [70-82] 70 (08/20 0436) Resp:  [16-20] 20 (08/20 0436) BP: (115-124)/(43-45) 117/43 (08/20 0436) SpO2:  [93 %-95 %] 93 % (08/20 0436) Last BM Date: 05/03/17 General:   Alert and oriented, pleasant Head:  Normocephalic and atraumatic. Eyes:  No icterus, sclera clear. Conjuctiva pink.  Abdomen:  Bowel sounds present, soft, tender low abdomen with deep palpation, non-distended. No HSM or hernias noted.  Extremities:  Without edema. Neurologic:  Alert and  oriented x4 Psych:  Alert and cooperative. Normal mood and affect.  Intake/Output from previous day: 08/19 0701 - 08/20 0700 In: 3810 [P.O.:720; I.V.:2390; IV Piggyback:700] Out: 6237 [Urine:1550] Intake/Output this shift: Total I/O In: 650 [P.O.:240; I.V.:310; IV Piggyback:100] Out: 1200 [Urine:1200]  Lab Results:  Recent Labs  05/02/17 0841 05/03/17 0653 05/04/17 0624  WBC 11.9* 8.1 7.5  HGB 14.7 11.0* 11.1*  HCT 43.2 33.0* 33.2*  PLT 259 222 211   BMET  Recent Labs  05/02/17 0841 05/03/17 0653 05/04/17 0624  NA 136 135 138  K 3.8 3.4* 3.5  CL 98* 106 107  CO2 27 24 24   GLUCOSE 109* 120* 106*  BUN 17 15 9   CREATININE 0.72 0.52 0.59  CALCIUM 9.3 7.9* 8.2*   LFT  Recent Labs  05/02/17 0841  PROT 7.4  ALBUMIN 3.9  AST 21  ALT 20  ALKPHOS 67  BILITOT 1.3*     Studies/Results: Ct Abdomen Pelvis W Contrast  Result Date: 05/02/2017 CLINICAL DATA:  Constipation.  Abdominal pain. EXAM: CT ABDOMEN AND PELVIS WITH CONTRAST TECHNIQUE: Multidetector CT imaging of the abdomen and pelvis was performed using the standard protocol following bolus administration of intravenous contrast. CONTRAST:  114mL ISOVUE-300  IOPAMIDOL (ISOVUE-300) INJECTION 61% COMPARISON:  04/29/2017 abdominal radiographs. 05/01/2007 CT abdomen/ pelvis. FINDINGS: Lower chest: Nonspecific mild patchy subpleural reticulation at both lung bases. Pacer leads are partially visualized terminating in the right atrium and right ventricular apex. Mitral annuloplasty ring is in place. Healed deformities in the lateral left sixth and seventh ribs. Hepatobiliary: Normal liver with no liver mass. Normal gallbladder with no radiopaque cholelithiasis. No biliary ductal dilatation. Pancreas: Normal, with no mass or duct dilation. Spleen: Normal size. No mass. Adrenals/Urinary Tract: Normal adrenals. No hydronephrosis. 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 Additional scattered subcentimeter hypodense renal cortical lesions in both kidneys are too small to characterize and require no further follow-up. Normal bladder. Stomach/Bowel: Small hiatal hernia. Otherwise collapsed and grossly normal stomach. Normal caliber small bowel with no small bowel wall thickening. Appendectomy. There is marked wall thickening throughout the sigmoid colon and rectum with associated mucosal hyperenhancement and prominent pericolonic fat stranding and ill-defined fluid. There is diffuse moderate distention of the cecum, ascending colon, hepatic flexure, transverse colon, splenic flexure and descending colon with associated liquid stool. Vascular/Lymphatic: Atherosclerotic nonaneurysmal abdominal aorta. Patent portal, splenic, hepatic and renal veins. No pathologically enlarged lymph nodes in the abdomen or pelvis. Reproductive: Enlarged myomatous uterus with scattered calcifications within several degenerated fibroids, overall decreased in size since 2008. No adnexal masses . Other: Fat stranding and minimal ill-defined fluid in the presacral space. No focal fluid collections. No pneumoperitoneum.  Musculoskeletal: No aggressive  appearing focal osseous lesions. Moderate T10 vertebral compression fracture is of indeterminate chronicity, new since 05/01/2007 CT. Mild thoracolumbar spondylosis . IMPRESSION: 1. CT findings are most compatible with a nonspecific infectious or inflammatory proctocolitis predominantly involving the sigmoid colon and rectum, with the differential including C. diff colitis. Liquid stool levels and moderate distention of the proximal 2/3 of the colon compatible with adynamic ileus . 2. No evidence of small-bowel obstruction. No perforation or abscess. 3. Moderate T10 vertebral compression fracture of indeterminate chronicity, new since 2008 CT. 4. Small hiatal hernia. 5.  Aortic Atherosclerosis (ICD10-I70.0). Electronically Signed   By: Ilona Sorrel M.D.   On: 05/02/2017 14:41    Assessment: 76 year old female admitted with abdominal pain and CT with findings of proctocolitis, with soft stools and unable to collect specimen due to nature of stools. Clinically, abdominal pain improved from admission and desires to advance diet. Remains on empiric cipro and flagyl at this time. Will advance diet to soft foods and keep on empiric antibiotics.     Plan: Advance diet to soft foods Remain on empiric Cipro and Flagyl Will continue to follow clinically Outpatient follow-up with Dr. Jodi Geralds, PhD, ANP-BC [facility]    LOS: 2 days    05/04/2017, 12:27 PM

## 2017-05-04 NOTE — Progress Notes (Signed)
Nutrition Brief Note  Patient identified on the Malnutrition Screening Tool (MST) Report  Wt Readings from Last 15 Encounters:  05/02/17 116 lb (52.6 kg)  03/19/17 114 lb 4 oz (51.8 kg)  03/12/17 115 lb (52.2 kg)  03/05/17 115 lb (52.2 kg)  11/29/15 119 lb 12.8 oz (54.3 kg)  08/28/15 125 lb 12.8 oz (57.1 kg)  07/17/15 124 lb 6.4 oz (56.4 kg)  05/14/15 122 lb (55.3 kg)  04/18/15 120 lb 12.8 oz (54.8 kg)  10/23/14 123 lb (55.8 kg)  07/07/14 124 lb (56.2 kg)  10/21/13 124 lb (56.2 kg)  07/05/13 121 lb 6.4 oz (55.1 kg)  07/13/12 122 lb 9.6 oz (55.6 kg)  12/03/11 117 lb (53.1 kg)    Body mass index is 22.65 kg/m. Patient meets criteria for normal based on current BMI. The patient affirms a good appetite and adamantly denies any weight loss. She reports usual wt range 115-120# which is also confirmed by hospital records.  Current diet order is Soft, patient is consuming approximately 60% of meals at this time. Labs and medications reviewed.   She does not meet criteria for malnutrition and no nutrition interventions warranted at this time. If nutrition issues arise, please consult RD.   Colman Cater MS,RD,CSG,LDN Office: [phone] Pager: [phone]

## 2017-05-04 NOTE — Progress Notes (Signed)
PROGRESS NOTE  [name] CVE:[id number] DOB: [birth year] DOA: 05/02/2017 PCP: Levin Erp, MD  Brief History:  76 year old female with a history of diarrhea predominant irritable bowel syndrome, atrial fibrillation on apixaban, mitral valve annuloplasty, GERD, and permanent pacemaker presented with one-week history of constipation and abdominal pain. The patient went to see her GI physician, Dr. Cristina Gong, on 04/29/2017. The patient tried bisacodyl suppositories, Colace, MiraLAX with minimal improvement. She ultimately was prescribed some type of bowel prep on 05/01/2017. She states that she threw trying to drink the prep. She has had some small amounts of liquid stool associated with her cathartics, but she denies any hematochezia, melena. Because of worsening abdominal pain, the patient presented to emergency department for further evaluation. She states that she usually takes loperamide on daily basis for her diarrhea predominant IBS with her last dose approximately one week prior to this admission. She feels that the onset of constipation may be related to initiation of verapamil which was started by her cardiologist after her office visit on 03/19/2017. The patient has a some nausea without emesis. She denies any fevers, chills, chest pain, shortness breath. The patient denies any recent travels, exotic foods, or sick contacts. CT of the abdomen and pelvis after admission revealed sigmoid colon and rectal wall thickening with pericolonic fat stranding and ill-defined fluid. WBC was noted to be 11.9, but the patient has been afebrile and hemodynamically stable patient was started on ciprofloxacin and metronidazole.  Assessment/Plan: Proctocolitis -Continue ciprofloxacin and metronidazole -abd pain improving-->advance diet -Appreciate GI consult -Patient states that she had sigmoidoscopy and barium enema greater than 5 years ago results unknown -05/02/2017 CT abdomen pelvis--sigmoid  and rectal wall thickening with pericolonic fat stranding. Diffuse moderate distention of the cecum, ascending colon, transverse colon, and descending colon with associated liquid stool consistent with adynamic ileus; fat stranding in presacral space -04/29/2017 abdominal x-ray--large amount of stool -stool pathogen panel--not done as stool is formed  Paroxysmal atrial fibrillation -Presently in sinus rhythm -Continue apixaban  -Continue verapamil for now   sinus node dysfunction -Status post permanent pacemaker  Hypertrophic cardiomyopathy -Status post mitral myectomy, mitral valve repair/annuloplasty   Anxiety  -Continue home dose of alprazolam, but increase to 3 times a day when necessary   Hypokalemia -replete -check mag   Disposition Plan:   Home 8/21 If stable Family Communication:   No Family at bedside  Consultants:  GI--Dr. Gala Romney  Code Status:  FULL  DVT Prophylaxis:  apixaban   Procedures: As Listed in Progress Note Above  Antibiotics: cipro 8/18>>> Metronidazole 8/18>>>   Subjective: Patient is feeling better. She has had small amounts of soft stool. She is passing flatus. She states that her abdominal pain is improving. She denies any fevers, chills, chest pain or short of breath, dysuria, hematuria. There is no hematochezia or melena.  Objective: Vitals:   05/03/17 1440 05/03/17 2100 05/04/17 0436 05/04/17 1448  BP: (!) 115/43 (!) 124/45 (!) 117/43 (!) 121/52  Pulse: 75 82 70 75  Resp: 16 20 20 20   Temp: (!) 97.4 F (36.3 C) 98.6 F (37 C) 98 F (36.7 C) 98.5 F (36.9 C)  TempSrc: Oral Oral Oral Oral  SpO2: 93% 95% 93% 96%  Weight:      Height:        Intake/Output Summary (Last 24 hours) at 05/04/17 1806 Last data filed at 05/04/17 1749  Gross per 24 hour  Intake  2940 ml  Output             2500 ml  Net              440 ml   Weight change:  Exam:   General:  Pt is alert, follows commands appropriately, not  in acute distress  HEENT: No icterus, No thrush, No neck mass, [place]/AT  Cardiovascular: RRR, S1/S2, no rubs, no gallops  Respiratory: CTA bilaterally, no wheezing, no crackles, no rhonchi  Abdomen: Soft/+BS, LLQ tender, non distended, no guarding  Extremities: No edema, No lymphangitis, No petechiae, No rashes, no synovitis   Data Reviewed: I have personally reviewed following labs and imaging studies Basic Metabolic Panel:  Recent Labs Lab 05/02/17 0841 05/03/17 0653 05/04/17 0624  NA 136 135 138  K 3.8 3.4* 3.5  CL 98* 106 107  CO2 27 24 24   GLUCOSE 109* 120* 106*  BUN 17 15 9   CREATININE 0.72 0.52 0.59  CALCIUM 9.3 7.9* 8.2*  MG  --   --  1.7   Liver Function Tests:  Recent Labs Lab 05/02/17 0841  AST 21  ALT 20  ALKPHOS 67  BILITOT 1.3*  PROT 7.4  ALBUMIN 3.9    Recent Labs Lab 05/02/17 0841  LIPASE 24   No results for input(s): AMMONIA in the last 168 hours. Coagulation Profile: No results for input(s): INR, PROTIME in the last 168 hours. CBC:  Recent Labs Lab 05/02/17 0841 05/03/17 0653 05/04/17 0624  WBC 11.9* 8.1 7.5  HGB 14.7 11.0* 11.1*  HCT 43.2 33.0* 33.2*  MCV 93.1 94.3 94.9  PLT 259 222 211   Cardiac Enzymes: No results for input(s): CKTOTAL, CKMB, CKMBINDEX, TROPONINI in the last 168 hours. BNP: Invalid input(s): POCBNP CBG: No results for input(s): GLUCAP in the last 168 hours. HbA1C: No results for input(s): HGBA1C in the last 72 hours. Urine analysis:    Component Value Date/Time   COLORURINE YELLOW 05/02/2017 1010   APPEARANCEUR HAZY (A) 05/02/2017 1010   LABSPEC 1.014 05/02/2017 1010   PHURINE 6.0 05/02/2017 1010   GLUCOSEU NEGATIVE 05/02/2017 1010   HGBUR MODERATE (A) 05/02/2017 1010   BILIRUBINUR NEGATIVE 05/02/2017 1010   KETONESUR 20 (A) 05/02/2017 1010   PROTEINUR NEGATIVE 05/02/2017 1010   UROBILINOGEN 0.2 03/01/2008 1201   NITRITE NEGATIVE 05/02/2017 1010   LEUKOCYTESUR TRACE (A) 05/02/2017 1010    Sepsis Labs: @LABRCNTIP (procalcitonin:4,lacticidven:4) )No results found for this or any previous visit (from the past 240 hour(s)).   Scheduled Meds: . apixaban  5 mg Oral BID  . cycloSPORINE  1 drop Both Eyes BID  . dicyclomine  10 mg Oral TID AC & HS  . diphenhydrAMINE  25 mg Oral QHS  . feeding supplement  1 Container Oral TID BM  . latanoprost  1 drop Both Eyes QHS  . predniSONE  5 mg Oral Once per day on Mon Wed Fri  . verapamil  120 mg Oral BID   Continuous Infusions: . sodium chloride 100 mL/hr at 05/03/17 0551  . ciprofloxacin Stopped (05/04/17 0654)  . metronidazole 500 mg (05/04/17 1749)    Procedures/Studies: Dg Abd 1 View  Result Date: 04/08/2017 CLINICAL DATA:  Follow-up markers. EXAM: ABDOMEN - 1 VIEW COMPARISON:  04/06/2017 FINDINGS: All previously ingested markers have pass through the colon. Moderate amount of fecal matter is noted in the colon. Small bowel gas pattern is normal. Chronic degenerative changes of the spine. Chronic uterine calcifications in the pelvis. IMPRESSION: All previously ingested  markers have passed. Electronically Signed   By: Nelson Chimes M.D.   On: 04/08/2017 12:51   Dg Abd 1 View  Result Date: 04/06/2017 CLINICAL DATA:  Sitz marker study day 1. EXAM: ABDOMEN - 1 VIEW COMPARISON:  Barium enema 02/22/2013 FINDINGS: Sitz markers are scattered throughout the colon from the proximal ascending colon to the distal transverse colon. Approximately 24 markers visualized. Moderate stool burden throughout the colon. No organomegaly. No free air or obstruction. IMPRESSION: Sitz marker scattered from the proximal ascending colon to the distal transverse colon. Moderate stool burden. Electronically Signed   By: Rolm Baptise M.D.   On: 04/06/2017 11:34   Ct Abdomen Pelvis W Contrast  Result Date: 05/02/2017 CLINICAL DATA:  Constipation.  Abdominal pain. EXAM: CT ABDOMEN AND PELVIS WITH CONTRAST TECHNIQUE: Multidetector CT imaging of the abdomen and  pelvis was performed using the standard protocol following bolus administration of intravenous contrast. CONTRAST:  116mL ISOVUE-300 IOPAMIDOL (ISOVUE-300) INJECTION 61% COMPARISON:  04/29/2017 abdominal radiographs. 05/01/2007 CT abdomen/ pelvis. FINDINGS: Lower chest: Nonspecific mild patchy subpleural reticulation at both lung bases. Pacer leads are partially visualized terminating in the right atrium and right ventricular apex. Mitral annuloplasty ring is in place. Healed deformities in the lateral left sixth and seventh ribs. Hepatobiliary: Normal liver with no liver mass. Normal gallbladder with no radiopaque cholelithiasis. No biliary ductal dilatation. Pancreas: Normal, with no mass or duct dilation. Spleen: Normal size. No mass. Adrenals/Urinary Tract: Normal adrenals. No hydronephrosis. 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 Additional scattered subcentimeter hypodense renal cortical lesions in both kidneys are too small to characterize and require no further follow-up. Normal bladder. Stomach/Bowel: Small hiatal hernia. Otherwise collapsed and grossly normal stomach. Normal caliber small bowel with no small bowel wall thickening. Appendectomy. There is marked wall thickening throughout the sigmoid colon and rectum with associated mucosal hyperenhancement and prominent pericolonic fat stranding and ill-defined fluid. There is diffuse moderate distention of the cecum, ascending colon, hepatic flexure, transverse colon, splenic flexure and descending colon with associated liquid stool. Vascular/Lymphatic: Atherosclerotic nonaneurysmal abdominal aorta. Patent portal, splenic, hepatic and renal veins. No pathologically enlarged lymph nodes in the abdomen or pelvis. Reproductive: Enlarged myomatous uterus with scattered calcifications within several degenerated fibroids, overall decreased in size since 2008. No adnexal masses . Other: Fat stranding and  minimal ill-defined fluid in the presacral space. No focal fluid collections. No pneumoperitoneum. Musculoskeletal: No aggressive appearing focal osseous lesions. Moderate T10 vertebral compression fracture is of indeterminate chronicity, new since 05/01/2007 CT. Mild thoracolumbar spondylosis . IMPRESSION: 1. CT findings are most compatible with a nonspecific infectious or inflammatory proctocolitis predominantly involving the sigmoid colon and rectum, with the differential including C. diff colitis. Liquid stool levels and moderate distention of the proximal 2/3 of the colon compatible with adynamic ileus . 2. No evidence of small-bowel obstruction. No perforation or abscess. 3. Moderate T10 vertebral compression fracture of indeterminate chronicity, new since 2008 CT. 4. Small hiatal hernia. 5.  Aortic Atherosclerosis (ICD10-I70.0). Electronically Signed   By: Ilona Sorrel M.D.   On: 05/02/2017 14:41   Dg Abd 2 Views  Result Date: 04/29/2017 CLINICAL DATA:  Abdominal pain with constipation. Recent Sitz marker study . EXAM: ABDOMEN - 2 VIEW COMPARISON:  04/08/2017. FINDINGS: Stool noted throughout the colon. Large amount of stool noted throughout the colon suggesting constipation. No bowel distention or free air. No Sitz markers noted. Lumbar spine scoliosis concave left. Diffuse degenerative change lumbar spine  and both hips . IMPRESSION: Large amount of stool noted throughout the colon. Findings suggest constipation. No bowel distention or free air. No Sitz markers noted. Electronically Signed   By: Marcello Moores  Register   On: 04/29/2017 13:24    [place] Ice, DO  [facility] Pager [phone]  If 7PM-7AM, please contact night-coverage [email or web address] Password [email or web address] 05/04/2017, 6:06 PM   LOS: 2 days

## 2017-05-05 LAB — BASIC METABOLIC PANEL
Anion gap: 4 — ABNORMAL LOW (ref 5–15)
BUN: 8 mg/dL (ref 6–20)
CALCIUM: 8.3 mg/dL — AB (ref 8.9–10.3)
CHLORIDE: 108 mmol/L (ref 101–111)
CO2: 26 mmol/L (ref 22–32)
CREATININE: 0.64 mg/dL (ref 0.44–1.00)
GFR calc Af Amer: >60 mL/min (ref >60)
Glucose, Bld: 86 mg/dL (ref 65–99)
Potassium: 3.6 mmol/L (ref 3.5–5.1)
SODIUM: 138 mmol/L (ref 135–145)

## 2017-05-05 LAB — MAGNESIUM: Magnesium: 1.8 mg/dL (ref 1.7–2.4)

## 2017-05-05 MED ORDER — DICYCLOMINE HCL 10 MG PO CAPS: 10.0000 mg | ORAL_CAPSULE | Freq: Three times a day (TID) | ORAL | 0 refills | Status: DC

## 2017-05-05 MED ORDER — CIPROFLOXACIN HCL 250 MG PO TABS: 500.0000 mg | ORAL_TABLET | Freq: Two times a day (BID) | ORAL | Status: DC

## 2017-05-05 MED ORDER — METRONIDAZOLE 500 MG PO TABS
500.0000 mg | ORAL_TABLET | Freq: Three times a day (TID) | ORAL | Status: DC
Start: 2017-05-05 — End: 2017-05-05
  Administered 2017-05-05: 500 mg via ORAL
  Filled 2017-05-05: qty 1

## 2017-05-05 MED ORDER — METRONIDAZOLE 500 MG PO TABS: 500.0000 mg | ORAL_TABLET | Freq: Three times a day (TID) | ORAL | 0 refills | Status: DC

## 2017-05-05 MED ORDER — CIPROFLOXACIN HCL 500 MG PO TABS
500.0000 mg | ORAL_TABLET | Freq: Two times a day (BID) | ORAL | 0 refills | Status: DC
Start: 2017-05-05 — End: 2017-05-05

## 2017-05-05 MED ORDER — TRAMADOL-ACETAMINOPHEN 37.5-325 MG PO TABS: 1.0000 | ORAL_TABLET | ORAL | 0 refills | Status: DC | PRN

## 2017-05-05 MED ORDER — CIPROFLOXACIN HCL 500 MG PO TABS: 500.0000 mg | ORAL_TABLET | Freq: Two times a day (BID) | ORAL | 0 refills | Status: DC

## 2017-05-05 NOTE — Care Management Important Message (Signed)
Important Message  Patient Details  Name: [name] MRN: [id number] Date of Birth: 12-03-1940   Medicare Important Message Given:  Yes    Sherald Barge, RN 05/05/2017, 10:12 AM

## 2017-05-05 NOTE — Discharge Summary (Signed)
Physician Discharge Summary  [name] HYI:[id number] DOB: June 12, 1941 DOA: 05/02/2017  PCP: Levin Erp, MD  Admit date: 05/02/2017 Discharge date: 05/05/2017  Admitted From: Home Disposition:  Home  Recommendations for Outpatient Follow-up:  1. Follow up with PCP in 1-2 weeks 2. Please obtain BMP/CBC in one week    Discharge Condition: Stable CODE STATUS:FULL Diet recommendation: Soft   Brief/Interim Summary: 76 year old female with a history of diarrhea predominant irritable bowel syndrome, atrial fibrillation on apixaban, mitral valve annuloplasty, GERD, and permanent pacemaker presented with one-week history of constipation and abdominal pain. The patient went to see her GI physician, Dr. Cristina Gong, on 04/29/2017. The patient tried bisacodyl suppositories, Colace, MiraLAX with minimal improvement. She ultimately was prescribed some type of bowel prep on 05/01/2017. She states that she threw trying to drink the prep. She has had some small amounts of liquid stool associated with her cathartics, but she denies any hematochezia, melena. Because of worsening abdominal pain, the patient presented to emergency department for further evaluation. She states that she usually takes loperamide on daily basis for her diarrhea predominant IBS with her last dose approximately one week prior to this admission. She feels that the onset of constipation may be related to initiation of verapamil which was started by her cardiologist after her office visit on 03/19/2017. The patient has a some nausea without emesis. She denies any fevers, chills, chest pain, shortness breath. The patient denies any recent travels, exotic foods, or sick contacts. CT of the abdomen and pelvis after admission revealed sigmoid colon and rectal wall thickening with pericolonic fat stranding and ill-defined fluid. WBC was noted to be 11.9, but the patient has been afebrile and hemodynamically stable patient was started on  ciprofloxacin and metronidazole.  Discharge Diagnoses:  Proctocolitis -Continue ciprofloxacin and metronidazole -abd pain improving-->advance diet -Appreciate GI consult--agreed with present management -Patient states that she had sigmoidoscopy and barium enema greater than 5 years ago results unknown -05/02/2017 CT abdomen pelvis--sigmoid and rectal wall thickening with pericolonic fat stranding. Diffuse moderate distention of the cecum, ascending colon, transverse colon, and descending colon with associated liquid stool consistent with adynamic ileus; fat stranding in presacral space -04/29/2017 abdominal x-ray--large amount of stool -stool pathogen panel--not done as stool is formed -home with cipro and flagyl x 8 more days to complete 10 days of therapy -pt requesting Bentyl upon d/c -Ultracet #15 at d/c.  The [facility] Controlled Substance Reporting System has been queried for this patient for the past 12 months prior to prescribing any opioids.   Paroxysmal atrial fibrillation -Presently in sinus rhythm -Continue apixaban  -Continue verapamil for now--f/u with cardiology  sinus node dysfunction -Status post permanent pacemaker  Hypertrophic cardiomyopathy -Status post mitral myectomy, mitral valve repair/annuloplasty  Anxiety  -Continue home dose of alprazolam, but increase to 3 times a day when necessary   Hypokalemia -replete -check mag--1.8   Discharge Instructions  Discharge Instructions    Diet - low sodium heart healthy    Complete by:  As directed    Increase activity slowly    Complete by:  As directed      Allergies as of 05/05/2017      Reactions   Amiodarone    Severe rash and upset stomach   Doxycycline       Medication List    TAKE these medications   ACIDOPHILUS PO Take by mouth 2 (two) times daily.   ALPRAZolam 0.25 MG tablet Commonly known as:  XANAX Take 1/2 tablet (0.125  mg) in the morning and 1 tablet (0.25 mg) at bedtime    amoxicillin 500 MG tablet Commonly known as:  AMOXIL Take 2,000 mg by mouth as directed. Take 1 hour before dental procedures   CALCIUM + D PO Take 600 mg by mouth 2 (two) times daily.   ciprofloxacin 500 MG tablet Commonly known as:  CIPRO Take 1 tablet (500 mg total) by mouth 2 (two) times daily.   cycloSPORINE 0.05 % ophthalmic emulsion Commonly known as:  RESTASIS Place 1 drop into both eyes 2 (two) times daily.   diclofenac sodium 1 % Gel Commonly known as:  VOLTAREN Apply 4 g topically 2 (two) times daily.   dicyclomine 10 MG capsule Commonly known as:  BENTYL Take 1 capsule (10 mg total) by mouth 4 (four) times daily -  before meals and at bedtime.   diphenhydrAMINE 25 mg capsule Commonly known as:  BENADRYL Take 25 mg by mouth at bedtime.   ELIQUIS 5 MG Tabs tablet Generic drug:  apixaban TAKE 1 TABLET BY MOUTH TWO  TIMES DAILY   latanoprost 0.005 % ophthalmic solution Commonly known as:  XALATAN Place 1 drop into both eyes at bedtime.   loperamide 2 MG capsule Commonly known as:  IMODIUM Take 4 mg by mouth 3 (three) times daily as needed for diarrhea or loose stools.   magnesium oxide 400 MG tablet Commonly known as:  MAG-OX Take 1 tablet (400 mg total) by mouth 2 (two) times daily.   metroNIDAZOLE 500 MG tablet Commonly known as:  FLAGYL Take 1 tablet (500 mg total) by mouth every 8 (eight) hours.   predniSONE 10 MG tablet Commonly known as:  DELTASONE Take 5 mg by mouth 3 (three) times a week.   sulfacetamide 10 % ophthalmic solution Commonly known as:  BLEPH-10 Place 1 drop into the left eye 4 (four) times daily.   traMADol-acetaminophen 37.5-325 MG tablet Commonly known as:  ULTRACET Take by mouth as needed. What changed:  Another medication with the same name was added. Make sure you understand how and when to take each.   traMADol-acetaminophen 37.5-325 MG tablet Commonly known as:  ULTRACET Take 1 tablet by mouth every 4 (four) hours as  needed for moderate pain. What changed:  You were already taking a medication with the same name, and this prescription was added. Make sure you understand how and when to take each.   valACYclovir 500 MG tablet Commonly known as:  VALTREX Take 500 mg by mouth every other day.   verapamil 120 MG CR tablet Commonly known as:  CALAN-SR Take 1 tablet (120 mg total) by mouth daily. What changed:  when to take this       Allergies  Allergen Reactions  . Amiodarone     Severe rash and upset stomach  . Doxycycline     Consultations:  GI--Dr. Gala Romney   Procedures/Studies: Dg Abd 1 View  Result Date: 04/08/2017 CLINICAL DATA:  Follow-up markers. EXAM: ABDOMEN - 1 VIEW COMPARISON:  04/06/2017 FINDINGS: All previously ingested markers have pass through the colon. Moderate amount of fecal matter is noted in the colon. Small bowel gas pattern is normal. Chronic degenerative changes of the spine. Chronic uterine calcifications in the pelvis. IMPRESSION: All previously ingested markers have passed. Electronically Signed   By: Nelson Chimes M.D.   On: 04/08/2017 12:51   Dg Abd 1 View  Result Date: 04/06/2017 CLINICAL DATA:  Sitz marker study day 1. EXAM: ABDOMEN - 1 VIEW COMPARISON:  Barium enema 02/22/2013 FINDINGS: Sitz markers are scattered throughout the colon from the proximal ascending colon to the distal transverse colon. Approximately 24 markers visualized. Moderate stool burden throughout the colon. No organomegaly. No free air or obstruction. IMPRESSION: Sitz marker scattered from the proximal ascending colon to the distal transverse colon. Moderate stool burden. Electronically Signed   By: Rolm Baptise M.D.   On: 04/06/2017 11:34   Ct Abdomen Pelvis W Contrast  Result Date: 05/02/2017 CLINICAL DATA:  Constipation.  Abdominal pain. EXAM: CT ABDOMEN AND PELVIS WITH CONTRAST TECHNIQUE: Multidetector CT imaging of the abdomen and pelvis was performed using the standard protocol following  bolus administration of intravenous contrast. CONTRAST:  165mL ISOVUE-300 IOPAMIDOL (ISOVUE-300) INJECTION 61% COMPARISON:  04/29/2017 abdominal radiographs. 05/01/2007 CT abdomen/ pelvis. FINDINGS: Lower chest: Nonspecific mild patchy subpleural reticulation at both lung bases. Pacer leads are partially visualized terminating in the right atrium and right ventricular apex. Mitral annuloplasty ring is in place. Healed deformities in the lateral left sixth and seventh ribs. Hepatobiliary: Normal liver with no liver mass. Normal gallbladder with no radiopaque cholelithiasis. No biliary ductal dilatation. Pancreas: Normal, with no mass or duct dilation. Spleen: Normal size. No mass. Adrenals/Urinary Tract: Normal adrenals. No hydronephrosis. 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 Additional scattered subcentimeter hypodense renal cortical lesions in both kidneys are too small to characterize and require no further follow-up. Normal bladder. Stomach/Bowel: Small hiatal hernia. Otherwise collapsed and grossly normal stomach. Normal caliber small bowel with no small bowel wall thickening. Appendectomy. There is marked wall thickening throughout the sigmoid colon and rectum with associated mucosal hyperenhancement and prominent pericolonic fat stranding and ill-defined fluid. There is diffuse moderate distention of the cecum, ascending colon, hepatic flexure, transverse colon, splenic flexure and descending colon with associated liquid stool. Vascular/Lymphatic: Atherosclerotic nonaneurysmal abdominal aorta. Patent portal, splenic, hepatic and renal veins. No pathologically enlarged lymph nodes in the abdomen or pelvis. Reproductive: Enlarged myomatous uterus with scattered calcifications within several degenerated fibroids, overall decreased in size since 2008. No adnexal masses . Other: Fat stranding and minimal ill-defined fluid in the presacral space. No focal  fluid collections. No pneumoperitoneum. Musculoskeletal: No aggressive appearing focal osseous lesions. Moderate T10 vertebral compression fracture is of indeterminate chronicity, new since 05/01/2007 CT. Mild thoracolumbar spondylosis . IMPRESSION: 1. CT findings are most compatible with a nonspecific infectious or inflammatory proctocolitis predominantly involving the sigmoid colon and rectum, with the differential including C. diff colitis. Liquid stool levels and moderate distention of the proximal 2/3 of the colon compatible with adynamic ileus . 2. No evidence of small-bowel obstruction. No perforation or abscess. 3. Moderate T10 vertebral compression fracture of indeterminate chronicity, new since 2008 CT. 4. Small hiatal hernia. 5.  Aortic Atherosclerosis (ICD10-I70.0). Electronically Signed   By: Ilona Sorrel M.D.   On: 05/02/2017 14:41   Dg Abd 2 Views  Result Date: 04/29/2017 CLINICAL DATA:  Abdominal pain with constipation. Recent Sitz marker study . EXAM: ABDOMEN - 2 VIEW COMPARISON:  04/08/2017. FINDINGS: Stool noted throughout the colon. Large amount of stool noted throughout the colon suggesting constipation. No bowel distention or free air. No Sitz markers noted. Lumbar spine scoliosis concave left. Diffuse degenerative change lumbar spine and both hips . IMPRESSION: Large amount of stool noted throughout the colon. Findings suggest constipation. No bowel distention or free air. No Sitz markers noted. Electronically Signed   By: Marcello Moores  Register   On: 04/29/2017 13:24  Discharge Exam: Vitals:   05/04/17 2239 05/05/17 0500  BP: (!) 138/57 99/62  Pulse: 70 73  Resp: 20 18  Temp: 97.8 F (36.6 C) 97.9 F (36.6 C)  SpO2: 96% 96%   Vitals:   05/04/17 0436 05/04/17 1448 05/04/17 2239 05/05/17 0500  BP: (!) 117/43 (!) 121/52 (!) 138/57 99/62  Pulse: 70 75 70 73  Resp: 20 20 20 18   Temp: 98 F (36.7 C) 98.5 F (36.9 C) 97.8 F (36.6 C) 97.9 F (36.6 C)  TempSrc: Oral  Oral Oral Oral  SpO2: 93% 96% 96% 96%  Weight:      Height:        General: Pt is alert, awake, not in acute distress Cardiovascular: RRR, S1/S2 +, no rubs, no gallops Respiratory: CTA bilaterally, no wheezing, no rhonchi Abdominal: Soft, NT, ND, bowel sounds + Extremities: no edema, no cyanosis   The results of significant diagnostics from this hospitalization (including imaging, microbiology, ancillary and laboratory) are listed below for reference.    Significant Diagnostic Studies: Dg Abd 1 View  Result Date: 04/08/2017 CLINICAL DATA:  Follow-up markers. EXAM: ABDOMEN - 1 VIEW COMPARISON:  04/06/2017 FINDINGS: All previously ingested markers have pass through the colon. Moderate amount of fecal matter is noted in the colon. Small bowel gas pattern is normal. Chronic degenerative changes of the spine. Chronic uterine calcifications in the pelvis. IMPRESSION: All previously ingested markers have passed. Electronically Signed   By: Nelson Chimes M.D.   On: 04/08/2017 12:51   Dg Abd 1 View  Result Date: 04/06/2017 CLINICAL DATA:  Sitz marker study day 1. EXAM: ABDOMEN - 1 VIEW COMPARISON:  Barium enema 02/22/2013 FINDINGS: Sitz markers are scattered throughout the colon from the proximal ascending colon to the distal transverse colon. Approximately 24 markers visualized. Moderate stool burden throughout the colon. No organomegaly. No free air or obstruction. IMPRESSION: Sitz marker scattered from the proximal ascending colon to the distal transverse colon. Moderate stool burden. Electronically Signed   By: Rolm Baptise M.D.   On: 04/06/2017 11:34   Ct Abdomen Pelvis W Contrast  Result Date: 05/02/2017 CLINICAL DATA:  Constipation.  Abdominal pain. EXAM: CT ABDOMEN AND PELVIS WITH CONTRAST TECHNIQUE: Multidetector CT imaging of the abdomen and pelvis was performed using the standard protocol following bolus administration of intravenous contrast. CONTRAST:  154mL ISOVUE-300 IOPAMIDOL  (ISOVUE-300) INJECTION 61% COMPARISON:  04/29/2017 abdominal radiographs. 05/01/2007 CT abdomen/ pelvis. FINDINGS: Lower chest: Nonspecific mild patchy subpleural reticulation at both lung bases. Pacer leads are partially visualized terminating in the right atrium and right ventricular apex. Mitral annuloplasty ring is in place. Healed deformities in the lateral left sixth and seventh ribs. Hepatobiliary: Normal liver with no liver mass. Normal gallbladder with no radiopaque cholelithiasis. No biliary ductal dilatation. Pancreas: Normal, with no mass or duct dilation. Spleen: Normal size. No mass. Adrenals/Urinary Tract: Normal adrenals. No hydronephrosis. 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 Additional scattered subcentimeter hypodense renal cortical lesions in both kidneys are too small to characterize and require no further follow-up. Normal bladder. Stomach/Bowel: Small hiatal hernia. Otherwise collapsed and grossly normal stomach. Normal caliber small bowel with no small bowel wall thickening. Appendectomy. There is marked wall thickening throughout the sigmoid colon and rectum with associated mucosal hyperenhancement and prominent pericolonic fat stranding and ill-defined fluid. There is diffuse moderate distention of the cecum, ascending colon, hepatic flexure, transverse colon, splenic flexure and descending colon with associated liquid stool. Vascular/Lymphatic:  Atherosclerotic nonaneurysmal abdominal aorta. Patent portal, splenic, hepatic and renal veins. No pathologically enlarged lymph nodes in the abdomen or pelvis. Reproductive: Enlarged myomatous uterus with scattered calcifications within several degenerated fibroids, overall decreased in size since 2008. No adnexal masses . Other: Fat stranding and minimal ill-defined fluid in the presacral space. No focal fluid collections. No pneumoperitoneum. Musculoskeletal: No aggressive appearing focal  osseous lesions. Moderate T10 vertebral compression fracture is of indeterminate chronicity, new since 05/01/2007 CT. Mild thoracolumbar spondylosis . IMPRESSION: 1. CT findings are most compatible with a nonspecific infectious or inflammatory proctocolitis predominantly involving the sigmoid colon and rectum, with the differential including C. diff colitis. Liquid stool levels and moderate distention of the proximal 2/3 of the colon compatible with adynamic ileus . 2. No evidence of small-bowel obstruction. No perforation or abscess. 3. Moderate T10 vertebral compression fracture of indeterminate chronicity, new since 2008 CT. 4. Small hiatal hernia. 5.  Aortic Atherosclerosis (ICD10-I70.0). Electronically Signed   By: Ilona Sorrel M.D.   On: 05/02/2017 14:41   Dg Abd 2 Views  Result Date: 04/29/2017 CLINICAL DATA:  Abdominal pain with constipation. Recent Sitz marker study . EXAM: ABDOMEN - 2 VIEW COMPARISON:  04/08/2017. FINDINGS: Stool noted throughout the colon. Large amount of stool noted throughout the colon suggesting constipation. No bowel distention or free air. No Sitz markers noted. Lumbar spine scoliosis concave left. Diffuse degenerative change lumbar spine and both hips . IMPRESSION: Large amount of stool noted throughout the colon. Findings suggest constipation. No bowel distention or free air. No Sitz markers noted. Electronically Signed   By: Marcello Moores  Register   On: 04/29/2017 13:24     Microbiology: No results found for this or any previous visit (from the past 240 hour(s)).   Labs: Basic Metabolic Panel:  Recent Labs Lab 05/02/17 0841 05/03/17 0653 05/04/17 0624 05/05/17 0411  NA 136 135 138 138  K 3.8 3.4* 3.5 3.6  CL 98* 106 107 108  CO2 27 24 24 26   GLUCOSE 109* 120* 106* 86  BUN 17 15 9 8   CREATININE 0.72 0.52 0.59 0.64  CALCIUM 9.3 7.9* 8.2* 8.3*  MG  --   --  1.7 1.8   Liver Function Tests:  Recent Labs Lab 05/02/17 0841  AST 21  ALT 20  ALKPHOS 67    BILITOT 1.3*  PROT 7.4  ALBUMIN 3.9    Recent Labs Lab 05/02/17 0841  LIPASE 24   No results for input(s): AMMONIA in the last 168 hours. CBC:  Recent Labs Lab 05/02/17 0841 05/03/17 0653 05/04/17 0624  WBC 11.9* 8.1 7.5  HGB 14.7 11.0* 11.1*  HCT 43.2 33.0* 33.2*  MCV 93.1 94.3 94.9  PLT 259 222 211   Cardiac Enzymes: No results for input(s): CKTOTAL, CKMB, CKMBINDEX, TROPONINI in the last 168 hours. BNP: Invalid input(s): POCBNP CBG: No results for input(s): GLUCAP in the last 168 hours.  Time coordinating discharge:  Greater than 30 minutes  Signed:  Chrislynn Mosely, DO [facility] Pager: [phone] 05/05/2017, 9:57 AM

## 2017-05-05 NOTE — Discharge Planning (Signed)
Patient IV removed.  RN assessment and VS revealed stability for discharge to home.  Discharge papers given, explained and educated.  Informed of suggested FU appt and appts made.  Pain script signed and given, rest e-scribed to pharm.  Once ready, will be wheeled to front and family transporting home via car.

## 2017-05-05 NOTE — Progress Notes (Signed)
    Subjective: Feels much improved. Stool is soft. Abdominal pain improved. Tolerating diet. Wants to go home. Spoke with Dr. Osborn Coho nurse yesterday and will be arranging outpatient visit.   Objective: Vital signs in last 24 hours: Temp:  [97.8 F (36.6 C)-98.5 F (36.9 C)] 97.9 F (36.6 C) (08/21 0500) Pulse Rate:  [70-75] 73 (08/21 0500) Resp:  [18-20] 18 (08/21 0500) BP: (99-138)/(52-62) 99/62 (08/21 0500) SpO2:  [96 %] 96 % (08/21 0500) Last BM Date: 05/04/17 General:   Alert and oriented, pleasant Head:  Normocephalic and atraumatic. Abdomen:  Bowel sounds present, soft, non-tender, non-distended.  Neurologic:  Alert and  oriented x4 Psych:  Alert and cooperative. Normal mood and affect.  Intake/Output from previous day: 08/20 0701 - 08/21 0700 In: 3230 [P.O.:720; I.V.:2010; IV Piggyback:500] Out: 1800 [Urine:1800] Intake/Output this shift: No intake/output data recorded.  Lab Results:  Recent Labs  05/03/17 0653 05/04/17 0624  WBC 8.1 7.5  HGB 11.0* 11.1*  HCT 33.0* 33.2*  PLT 222 211   BMET  Recent Labs  05/03/17 0653 05/04/17 0624 05/05/17 0411  NA 135 138 138  K 3.4* 3.5 3.6  CL 106 107 108  CO2 24 24 26   GLUCOSE 120* 106* 86  BUN 15 9 8   CREATININE 0.52 0.59 0.64  CALCIUM 7.9* 8.2* 8.3*    Assessment/Plan: 76 year old female admitted with abdominal pain and CT with findings of proctocolitis, with soft stools and unable to collect specimen due to nature of stools. Clinically, she has continued to improve, tolerating diet, and she desires to go home. Appropriate from a GI standpoint for discharge home. She has already contacted her primary GI's office to arrange outpatient follow-up. We will sign off for now; anticipate discharge home today.  Annitta Needs, PhD, ANP-BC [facility]    LOS: 3 days    05/05/2017, 9:24 AM

## 2017-05-07 ENCOUNTER — Telehealth: Payer: Self-pay | Admitting: Internal Medicine

## 2017-05-07 NOTE — Telephone Encounter (Signed)
New message   Janett Billow is going to fax over a form with the same information.      [facility] Pre-operative Risk Assessment    Request for surgical clearance:  1. What type of surgery is being performed? Urgent flexible sigmoidoscopy   2. When is this surgery scheduled? Monday 05/11/17    3. Are there any medications that need to be held prior to surgery and how long? Eliquis held the day before and day of   4. Name of physician performing surgery? Dr. Cristina Gong   5. What is your office phone and fax number? [place] 05/07/2017, 1:05 PM  _________________________________________________________________   (provider comments below)

## 2017-05-07 NOTE — Telephone Encounter (Signed)
Pt takes Eliquis for afib with CHADS2 score of 1 (age) and CHADS2VASc of 82 (age x2 and sex). Renal function is normal. Ok to hold Eliquis as requested the day before and the day of procedure. Clearance faxed to [phone].

## 2017-05-07 NOTE — Telephone Encounter (Signed)
To pharmacy to review regarding blood thinner.

## 2017-05-13 ENCOUNTER — Telehealth: Payer: Self-pay | Admitting: Internal Medicine

## 2017-05-13 NOTE — Telephone Encounter (Signed)
Fax received from [facility] regarding patient's sigmoidoscopy that was scheduled for 05/11/17. Phone call received last week regarding anticoagulation and this was addressed by pharmacy. However, the fax that was sent stated to please call the patient to address increased PVC's.   I spoke with the patient today and she states that she has had a "horrible" 2 weeks with her GI system. She was having cramping so bad that she went to the [facility] for evaluation- felt to be colitis.  She followed up with Dr. Delma Officer after discharge and had her sigmoidoscopy on Monday.  She states her results are pending at this time and she should have these back on Friday.  In regards to PVC's she states she thinks these have been worse, but she is not real sure due to the severity of her GI symptoms.  She continues with Verapamil 120 mg BID. She is also taking Xanax 0.25 mg BID (ok per Dr. Nyoka Cowden).  I advised her that due to her GI symptoms, this most certainly could have aggrevated her PVC's. She is aware I will follow up with Dr. Caryl Comes on Friday and also call her back to follow up on her symptoms.  She is agreeable.

## 2017-05-15 NOTE — Telephone Encounter (Addendum)
Jennifer Sprang, MD  Emily Filbert, RN        H  I THINK YOU are right Lets wait to see what happens to the GI stuff before we muck with meds for PVCs  Thanks      I called to follow up with the patient- she states she is still wiped out from her GI episode.  She is taking her AM dose of verapamil a little earlier in the morning and feels better doing this.  She is aware of Dr. Olin Pia recommendations and is agreeable. She will contact us if symptoms with her PVC's worsen.

## 2017-05-19 ENCOUNTER — Other Ambulatory Visit: Payer: Self-pay | Admitting: *Deleted

## 2017-05-19 ENCOUNTER — Telehealth: Payer: Self-pay | Admitting: Internal Medicine

## 2017-05-19 MED ORDER — VERAPAMIL HCL ER 120 MG PO TBCR: 120.0000 mg | EXTENDED_RELEASE_TABLET | Freq: Two times a day (BID) | ORAL | 9 refills | Status: DC

## 2017-05-19 NOTE — Telephone Encounter (Signed)
Should this be qd or bid? Please advise. Thanks, MI

## 2017-05-19 NOTE — Telephone Encounter (Signed)
She is taking verapamil twice daily- ok to refill.   Thanks!

## 2017-05-19 NOTE — Telephone Encounter (Signed)
New Message   pt verbalized that she is calling for rn, she needs a new prescription    *STAT* If patient is at the pharmacy, call can be transferred to refill team.   1. Which medications need to be refilled? (please list name of each medication and dose if known) zerapamil 120mg  2x day  2. Which pharmacy/location (including street and city if local pharmacy) is medication to be sent to? [facility] in [place]   3. Do they need a 30 day or 90 day supply? Unknown

## 2017-07-02 ENCOUNTER — Ambulatory Visit (INDEPENDENT_AMBULATORY_CARE_PROVIDER_SITE_OTHER): Payer: Medicare Other | Admitting: Internal Medicine

## 2017-07-02 ENCOUNTER — Encounter: Payer: Self-pay | Admitting: Internal Medicine

## 2017-07-02 VITALS — BP 126/60 | HR 70 | Ht 60.0 in | Wt 116.8 lb

## 2017-07-02 DIAGNOSIS — I422 Other hypertrophic cardiomyopathy: Secondary | ICD-10-CM

## 2017-07-02 DIAGNOSIS — I495 Sick sinus syndrome: Secondary | ICD-10-CM | POA: Diagnosis not present

## 2017-07-02 DIAGNOSIS — Z950 Presence of cardiac pacemaker: Secondary | ICD-10-CM | POA: Diagnosis not present

## 2017-07-02 DIAGNOSIS — I480 Paroxysmal atrial fibrillation: Secondary | ICD-10-CM

## 2017-07-02 LAB — CUP PACEART INCLINIC DEVICE CHECK
Battery Voltage: 2.79 V
Brady Statistic RA Percent Paced: 84 %
Date Time Interrogation Session: 20181018141755
Implantable Lead Implant Date: 20090617
Implantable Lead Location: 753860
Implantable Pulse Generator Implant Date: 20090617
Lead Channel Impedance Value: 427 Ohm
Lead Channel Pacing Threshold Amplitude: 0.75 V
Lead Channel Sensing Intrinsic Amplitude: 5 mV
Lead Channel Setting Pacing Amplitude: 2 V
MDC IDC LEAD IMPLANT DT: 20090617
MDC IDC LEAD LOCATION: 753859
MDC IDC MSMT BATTERY IMPEDANCE: 2900 Ohm
MDC IDC MSMT LEADCHNL RA PACING THRESHOLD PULSEWIDTH: 0.4 ms
Pulse Gen Serial Number: 1237757

## 2017-07-02 MED ORDER — APIXABAN 5 MG PO TABS: 5.0000 mg | ORAL_TABLET | Freq: Two times a day (BID) | ORAL | 11 refills | Status: DC

## 2017-07-02 NOTE — Progress Notes (Signed)
Patient Care Team: Levin Erp, MD as PCP - General (Internal Medicine)   HPI  [name] is a 76 y.o. female Seen in follow-up for palpitations. 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    She was seen a few weeks ago because of the palpitations as noted. Her device was programmed to activate AV hysteresis. , we reprogrammed her device DDDR--DDIR to eliminate ventricular pacing as a potential explanation for her palpitations. Most recently reprogrammed her AAIR.  She continued with significant palpitations. An event recorder to elucidate the mechanism of her symptoms. >>>PVCs  PVCs were more active recently. Initial diagnosis was colitis. A now thickened simply IBS. She is taking verapamil and it seems to help attenuate the symptoms of her PVCs; she is much happier Date Cr  K Mg  6/18 0.78 4.0 2.0         DATE TEST    10/15    Echo   EF 55-60 % Mod AI  10/17    Echo   EF 55-60 % Mod AI         The patient denies chest pain, shortness of breath, nocturnal dyspnea, orthopnea or peripheral edema.  There have been no palpitations, lightheadedness or syncope.     Past Medical History:  Diagnosis Date  . Asymmetric septal hypertrophy (HCC)   . Atrial fibrillation ([place])    with permanent pacemaker  . Blepharitis 10/2016   Dr.Groat  . Current use of long term anticoagulation   . Endometriosis   . Glaucoma   . H/O mitral valve repair   . 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 IBS (irritable bowel syndrome) 12/2002  . Irregular heart beat   . 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 Rib fracture 01/2017   Dr.Green  . Severe mitral regurgitation     Past Surgical History:    Procedure Laterality Date  . APPENDECTOMY  1952  . HYSTEROSCOPY  06/2002   Polyp; Small fibroid  . HYSTEROSCOPY  09/10/09   Myoma  . MITRAL VALVE REPAIR      (plication of posterior leaflet with 28-mm Medtronic CG Future  Band annuloplasty). (02/23/08  Dr. Roxy Manns)  . MYOMECTOMY     median sternotomy  . PACEMAKER INSERTION  02/2008  . TONSILLECTOMY AND ADENOIDECTOMY  1955  . TUBAL LIGATION  1983    Current Outpatient Prescriptions  Medication Sig Dispense Refill  . ALPRAZolam (XANAX) 0.25 MG tablet Take 1/2 tablet (0.125 mg) in the morning and 1 tablet (0.25 mg) at bedtime    . amoxicillin (AMOXIL) 500 MG tablet Take 2,000 mg by mouth as directed. Take 1 hour before dental procedures    . Calcium Carbonate-Vitamin D (CALCIUM + D PO) Take 600 mg by mouth 2 (two) times daily.     . cycloSPORINE (RESTASIS) 0.05 % ophthalmic emulsion Place 1 drop into both eyes 2 (two) times daily.    . diclofenac sodium (VOLTAREN) 1 % GEL Apply 4 g topically 2 (two) times daily.    . diphenhydrAMINE (BENADRYL) 25 mg capsule Take 50 mg by mouth at bedtime.     Marland Kitchen ELIQUIS 5 MG TABS tablet TAKE 1 TABLET BY MOUTH TWO  TIMES DAILY 180  tablet 3  . Lactobacillus (ACIDOPHILUS PO) Take by mouth 2 (two) times daily.    Marland Kitchen latanoprost (XALATAN) 0.005 % ophthalmic solution Place 1 drop into both eyes at bedtime.    Marland Kitchen loperamide (IMODIUM) 2 MG capsule Take 4 mg by mouth 3 (three) times daily as needed for diarrhea or loose stools.    . magnesium oxide (MAG-OX) 400 MG tablet Take 1 tablet (400 mg total) by mouth 2 (two) times daily. 30 tablet 6  . predniSONE (DELTASONE) 10 MG tablet Take 5 mg by mouth 3 (three) times a week.    . sulfacetamide (BLEPH-10) 10 % ophthalmic solution Place 1 drop into the left eye 4 (four) times daily.    . valACYclovir (VALTREX) 500 MG tablet Take 500 mg by mouth every other day.     . verapamil (CALAN-SR) 120 MG CR tablet Take 1 tablet (120 mg total) by mouth 2 (two) times daily. 60 tablet 9    No current facility-administered medications for this visit.     Allergies  Allergen Reactions  . Amiodarone     Severe rash and upset stomach  . Doxycycline     Unknown - pt had other issues going on at the time, unsure if this medication had anything to do with how she felt    Review of Systems negative except from HPI and PMH  Physical Exam BP 126/60   Pulse 70   Ht 5' (1.524 m)   Wt 116 lb 12.8 oz (53 kg)   SpO2 94%   BMI 22.81 kg/m  Well developed and nourished in no acute distress HENT normal Neck supple with JVP-flat Carotids brisk and full without bruits Clear Regular rate and rhythm, 2/6 systolic m Abd-soft with active BS without hepatomegaly No Clubbing cyanosis edema Skin-warm and dry A & Oriented  Grossly normal sensory and motor function   ECG obtained today demonstrates atrial pacing with intrinsic conduction at rate of 70 Intervals 20/14/44 Left bundle branch block PVCs frequent  Assessment and  Plan  Hypertrophic cardiomyopathy status post myectomy  Family has issues have been addressed  Aortic insufficiency-moderate with normal left ventricular size  Paroxysmal atrial fibrillation   pacemaker-St. Jude  PVCs and PACs    We will plan to recheck an echo next year.  I suspect she will have intermittent problems with her PVCs. Currently while they are not quiescent electrically they are symptomatically. We will continue her on her verapamil.  No shortness of breath. She is euvolemic.  She has had no interval atrial fibrillation. With her HCM, indiicated for indefinite anticoagulation

## 2017-07-02 NOTE — Patient Instructions (Signed)
Medication Instructions:  Your physician recommends that you continue on your current medications as directed. Please refer to the Current Medication list given to you today.  Labwork: None ordered  Testing/Procedures: None ordered  Follow-Up:  Your physician wants you to follow-up in: 6 months with device clinic. You will receive a reminder letter in the mail two months in advance. If you don't receive a letter, please call our office to schedule the follow-up appointment.  Your physician wants you to follow-up in: 1 year with Dr. Caryl Comes.   You will receive a reminder letter in the mail two months in advance. If you don't receive a letter, please call our office to schedule the follow-up appointment.  --- If you need a refill on your cardiac medications before your next appointment, please call your pharmacy. ---  Thank you for choosing [facility]!!   [phone] [id number]  Any Other Special Instructions Will Be Listed Below (If Applicable).

## 2017-07-03 ENCOUNTER — Telehealth: Payer: Self-pay

## 2017-07-03 ENCOUNTER — Encounter: Payer: Medicare Other | Admitting: Internal Medicine

## 2017-07-03 NOTE — Telephone Encounter (Signed)
-----   Message from Stanton Kidney, RN sent at 07/02/2017 10:56 AM EDT ----- Regarding: Eliquis doughnut hole Pt is currently in the doughnut hole and taking Eliquis. Please contact her to discuss options.  thx :)

## 2017-07-03 NOTE — Telephone Encounter (Signed)
I gave the pt the phone number to call [employer] as she has several questions regarding their pt assistance program that I cannot answer. She states that she will call me back if she is interested in applying for pt assistance.

## 2017-07-15 ENCOUNTER — Telehealth: Payer: Self-pay | Admitting: *Deleted

## 2017-07-15 NOTE — Telephone Encounter (Signed)
LMOM (DPR) explaining that patient was successfully unenrolled from [employer] and that she should receive a prepaid label from [employer] to return her equipment.  [facility] phone number for questions/concerns.

## 2017-08-13 ENCOUNTER — Other Ambulatory Visit: Payer: Self-pay | Admitting: Internal Medicine

## 2017-08-13 DIAGNOSIS — R51 Headache: Principal | ICD-10-CM

## 2017-08-13 DIAGNOSIS — R519 Headache, unspecified: Secondary | ICD-10-CM

## 2017-08-19 ENCOUNTER — Ambulatory Visit
Admission: RE | Admit: 2017-08-19 | Discharge: 2017-08-19 | Disposition: A | Discharge status: Home / Self Care | Payer: Medicare Other | Source: Ambulatory Visit | Attending: Internal Medicine | Admitting: Internal Medicine

## 2017-08-19 DIAGNOSIS — R51 Headache: Principal | ICD-10-CM

## 2017-08-19 DIAGNOSIS — R519 Headache, unspecified: Secondary | ICD-10-CM

## 2017-09-03 ENCOUNTER — Ambulatory Visit: Payer: Medicare Other | Admitting: Neurology

## 2017-09-03 ENCOUNTER — Encounter: Payer: Self-pay | Admitting: Neurology

## 2017-09-03 VITALS — BP 127/64 | HR 68 | Ht 60.0 in | Wt 113.5 lb

## 2017-09-03 DIAGNOSIS — R51 Headache: Secondary | ICD-10-CM

## 2017-09-03 DIAGNOSIS — R519 Headache, unspecified: Secondary | ICD-10-CM

## 2017-09-03 MED ORDER — BUTALBITAL-APAP-CAFFEINE 50-325-40 MG PO TABS: 1.0000 | ORAL_TABLET | Freq: Four times a day (QID) | ORAL | 3 refills | Status: DC | PRN

## 2017-09-03 NOTE — Progress Notes (Signed)
PATIENT: [name] DOB: 1941-05-22  Chief Complaint  Patient presents with  . Headache    Reports daily pressure headaches for the last six weeks. She has been taking Tylenol 6104m, 3 tablets daily for the last month.  She had head CT on 08/19/17 that did not exhibit a cause for her pain (unable to have MRI due to pacemaker).  .Marland KitchenPCP    GLevin Erp MD     HISTORICAL  Jennifer OVANDOis a 76year old female, seen in refer by her primary care doctor GLevin Erp for evaluation of headaches, initial evaluation was on September 03, 2017.  I reviewed and summarized the referring note, she had a history of atrial fibrillation on chronic Eliquis treatment, also history of mitral valve repair in 2009 for mitral valve regurgitation,  pacemaker placement. She also has IBS, had significant GI issue over the summer, was treated with antibiotics.  She complains of a headache since November 2018, constant low-grade pressure like a tight band surrounding her temporal, occipital, frontal region, tenderness of nuchal line upon deep palpation, improved by drink coffee, and a low-dose of alcohol,  The best time is in the early morning, seems to get worse as day goes by,  She denied previous history of headaches, no visual loss, no chewing difficulty, no lateralized motor or sensory deficit, ESR C-reactive protein was normal on July 29, 2017   She also has a history of gradual onset head titubation, her son has bilateral hands postural tremor, she has bilateral tinnitus, wearing hearing aid,  CT head without contrast at [facility] August 19, 2017 showed mild atrophy, no acute abnormality  REVIEW OF SYSTEMS: Full 14 system review of systems performed and notable only for headache, tremor, snoring, cough, joint pain, diarrhea, hearing loss, ringing in ear, rash  ALLERGIES: Allergies  Allergen Reactions  . Amiodarone     Severe rash and upset stomach  . Doxycycline     Unknown  - pt had other issues going on at the time, unsure if this medication had anything to do with how she felt    HOME MEDICATIONS: Current Outpatient Medications  Medication Sig Dispense Refill  . acetaminophen (TYLENOL 8 HOUR) 650 MG CR tablet Take 650 mg by mouth. Taking 3 tablets daily.    .Marland KitchenALPRAZolam (XANAX) 0.25 MG tablet Take 1 tablet (0.25 mg) at bedtime    . amoxicillin (AMOXIL) 500 MG tablet Take 2,000 mg by mouth as directed. Take 1 hour before dental procedures    . apixaban (ELIQUIS) 5 MG TABS tablet Take 1 tablet (5 mg total) by mouth 2 (two) times daily. 60 tablet 11  . Calcium Carbonate-Vitamin D (CALCIUM + D PO) Take 600 mg by mouth 2 (two) times daily.     . cycloSPORINE (RESTASIS) 0.05 % ophthalmic emulsion Place 1 drop into both eyes 2 (two) times daily.    . diclofenac sodium (VOLTAREN) 1 % GEL Apply 4 g topically 2 (two) times daily.    . diphenhydrAMINE (BENADRYL) 25 mg capsule Take 50 mg by mouth at bedtime.     . Eyelid Cleansers (AVENOVA) 0.01 % SOLN daily.    . Glucosamine HCl (GLUCOSAMINE PO) Take by mouth daily.    .Marland Kitchenlatanoprost (XALATAN) 0.005 % ophthalmic solution Place 1 drop into both eyes at bedtime.    .Marland Kitchenloperamide (IMODIUM) 2 MG capsule Take 4 mg by mouth 3 (three) times daily as needed for diarrhea or loose stools.    .Marland Kitchen  magnesium oxide (MAG-OX) 400 MG tablet Take 1 tablet (400 mg total) by mouth 2 (two) times daily. 30 tablet 6  . predniSONE (DELTASONE) 10 MG tablet Take 5 mg by mouth 3 (three) times a week.    . Probiotic Product (ALIGN PO) Take by mouth daily.    . valACYclovir (VALTREX) 500 MG tablet Take 500 mg by mouth every other day.     . verapamil (CALAN-SR) 120 MG CR tablet Take 1 tablet (120 mg total) by mouth 2 (two) times daily. 60 tablet 9  . Wheat Dextrin (BENEFIBER PO) Take by mouth daily.     No current facility-administered medications for this visit.     PAST MEDICAL HISTORY: Past Medical History:  Diagnosis Date  . Asymmetric septal  hypertrophy (HCC)   . Atrial fibrillation ([place])    with permanent pacemaker  . Blepharitis 10/2016   Dr.Groat  . Current use of long term anticoagulation   . Endometriosis   . Glaucoma   . H/O mitral valve repair   . Headache   . 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 IBS (irritable bowel syndrome) 12/2002  . Irregular heart beat   . 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 Rib fracture 01/2017   Dr.Green  . Severe mitral regurgitation     PAST SURGICAL HISTORY: Past Surgical History:  Procedure Laterality Date  . APPENDECTOMY  1952  . HYSTEROSCOPY  06/2002   Polyp; Small fibroid  . HYSTEROSCOPY  09/10/09   Myoma  . MITRAL VALVE REPAIR      (plication of posterior leaflet with 28-mm Medtronic CG Future  Band annuloplasty). (02/23/08  Dr. Roxy Manns)  . MYOMECTOMY     median sternotomy  . PACEMAKER INSERTION  02/2008  . TONSILLECTOMY AND ADENOIDECTOMY  1955  . TUBAL LIGATION  1983    FAMILY HISTORY: Family History  Problem Relation Age of Onset  . Breast cancer Mother 14  . Heart disease Mother   . Breast cancer Maternal Grandmother 65  . Coronary artery disease Brother        n his 62s with triple bypass   . Coronary artery disease Brother        with 2 vessel coronary disease at age 13  . Heart attack Father     SOCIAL HISTORY:  Social History   Socioeconomic History  . Marital status: Married    Spouse name: Not on file  . Number of children: 1  . Years of education: 16  . Highest education level: Master's degree (e.g., MA, MS, MEng, MEd, MSW, MBA)  Social Needs  . Financial resource strain: Not on file  . Food insecurity - worry: Not on file  . Food insecurity - inability: Not on file  . Transportation needs - medical: Not on file  . Transportation needs - non-medical: Not on file  Occupational History  . Occupation: Retired  Tobacco Use  . Smoking status: Former Research scientist (life sciences)  .  Smokeless tobacco: Never Used  . Tobacco comment: QUIT 45+ YEARS AGO  Substance and Sexual Activity  . Alcohol use: Yes    Comment: 1 shot of bourbon most days, occ wine  . Drug use: Yes    Types: Marijuana    Comment: Occasional use  . Sexual activity: Yes    Partners: Male    Birth control/protection: Post-menopausal  Other Topics Concern  . Not on file  Social  History Narrative   Family History:   Last updated: 12/07/2008   Is remarkable for brother in his 32s with triple bypass and another brother with 2 vessel coronary disease at age 59.  Her father died at a later age with a myocardial infarction.       Social History:   Last updated: 12/07/2008   The patient is married and lives here in [place] with her husband.  They have 1 grown child.  She is a retired Network engineer at [employer].  Her follow is Dr. Mendel Ryder.  She is a nonsmoker, although she did smoke briefly during her youth and quit smoking during her 10s.  She denies excessive alcohol consumption.       Right-haned.   Tea or coffee occasionally.        PHYSICAL EXAM   Vitals:   09/03/17 0855  BP: 127/64  Pulse: 68  Weight: 113 lb 8 oz (51.5 kg)  Height: 5' (1.524 m)    Not recorded      Body mass index is 22.17 kg/m.  PHYSICAL EXAMNIATION:  Gen: NAD, conversant, well nourised, obese, well groomed                     Cardiovascular: Regular rate rhythm, no peripheral edema, warm, nontender. Eyes: Conjunctivae clear without exudates or hemorrhage Neck: Supple, no carotid bruits. Pulmonary: Clear to auscultation bilaterally   NEUROLOGICAL EXAM:  MENTAL STATUS: Speech:    Speech is normal; fluent and spontaneous with normal comprehension.  Cognition:     Orientation to time, place and person     Normal recent and remote memory     Normal Attention span and concentration     Normal Language, naming, repeating,spontaneous speech     Fund of knowledge   CRANIAL NERVES: CN II:  Visual fields are full to confrontation. Fundoscopic exam is normal with sharp discs and no vascular changes. Pupils are round equal and briskly reactive to light. CN III, IV, VI: extraocular movement are normal. No ptosis. CN V: Facial sensation is intact to pinprick in all 3 divisions bilaterally. Corneal responses are intact.  CN VII: Face is symmetric with normal eye closure and smile. CN VIII: Hearing is normal to rubbing fingers CN IX, X: Palate elevates symmetrically. Phonation is normal. CN XI: Head turning and shoulder shrug are intact CN XII: Tongue is midline with normal movements and no atrophy.  MOTOR: There is no pronator drift of out-stretched arms. Muscle bulk and tone are normal. Muscle strength is normal.  REFLEXES: Reflexes are 2+ and symmetric at the biceps, triceps, knees, and ankles. Plantar responses are flexor.  SENSORY: Intact to light touch, pinprick, positional sensation and vibratory sensation are intact in fingers and toes.  COORDINATION: Rapid alternating movements and fine finger movements are intact. There is no dysmetria on finger-to-nose and heel-knee-shin.    GAIT/STANCE: Posture is normal. Gait is steady with normal steps, base, arm swing, and turning. Heel and toe walking are normal. Tandem gait is normal.  Romberg is absent.   DIAGNOSTIC DATA (LABS, IMAGING, TESTING) - I reviewed patient records, labs, notes, testing and imaging myself where available.   ASSESSMENT AND PLAN  MACKENZIE GROOM is a 76 y.o. female   New onset headaches,  Normal ESR C-reactive protein,  CT head without contrast, and neurological examination  She does has tenderness upon deep palpation as bilateral nuchal lines, consistent with musculoskeletal etiology  I have suggested neck stretching exercise,  hot compression, Fioricet as needed   Marcial Pacas, M.D. Ph.D.  [facility] [address], [place], [place] [address] Ph: [phone] Fax:  [phone]  CC: Levin Erp, MD

## 2017-09-16 ENCOUNTER — Encounter: Payer: Self-pay | Admitting: Internal Medicine

## 2017-09-18 ENCOUNTER — Encounter: Payer: Self-pay | Admitting: Internal Medicine

## 2017-09-22 ENCOUNTER — Encounter: Payer: Self-pay | Admitting: Adult Health

## 2017-09-22 ENCOUNTER — Ambulatory Visit (INDEPENDENT_AMBULATORY_CARE_PROVIDER_SITE_OTHER): Payer: Medicare Other | Admitting: Adult Health

## 2017-09-22 DIAGNOSIS — G44221 Chronic tension-type headache, intractable: Secondary | ICD-10-CM

## 2017-09-22 DIAGNOSIS — R208 Other disturbances of skin sensation: Secondary | ICD-10-CM | POA: Diagnosis not present

## 2017-09-22 MED ORDER — GABAPENTIN 100 MG PO CAPS: ORAL_CAPSULE | ORAL | 5 refills | Status: DC

## 2017-09-22 NOTE — Patient Instructions (Addendum)
Your Plan: Start Gabapentin: take 1 capsule  By mouth at bedtime for 1 week then increase to 1 cap twice a day Blood work today If your symptoms worsen or you develop new symptoms please let us know.   Thank you for coming to see [place] at [facility]. I hope we have been able to provide you high quality care today.  You may receive a patient satisfaction survey over the next few weeks. We would appreciate your feedback and comments so that we may continue to improve ourselves and the health of our patients.  Gabapentin capsules or tablets What is this medicine? GABAPENTIN (GA ba pen tin) is used to control partial seizures in adults with epilepsy. It is also used to treat certain types of nerve pain. This medicine may be used for other purposes; ask your health care provider or pharmacist if you have questions. COMMON BRAND NAME(S): Active-PAC with Gabapentin, Gabarone, Neurontin What should I tell my health care provider before I take this medicine? They need to know if you have any of these conditions: -kidney disease -suicidal thoughts, plans, or attempt; a previous suicide attempt by you or a family member -an unusual or allergic reaction to gabapentin, other medicines, foods, dyes, or preservatives -pregnant or trying to get pregnant -breast-feeding How should I use this medicine? Take this medicine by mouth with a glass of water. Follow the directions on the prescription label. You can take it with or without food. If it upsets your stomach, take it with food.Take your medicine at regular intervals. Do not take it more often than directed. Do not stop taking except on your doctor's advice. If you are directed to break the 600 or 800 mg tablets in half as part of your dose, the extra half tablet should be used for the next dose. If you have not used the extra half tablet within 28 days, it should be thrown away. A special MedGuide will be given to you by the pharmacist  with each prescription and refill. Be sure to read this information carefully each time. Talk to your pediatrician regarding the use of this medicine in children. Special care may be needed. Overdosage: If you think you have taken too much of this medicine contact a poison control center or emergency room at once. NOTE: This medicine is only for you. Do not share this medicine with others. What if I miss a dose? If you miss a dose, take it as soon as you can. If it is almost time for your next dose, take only that dose. Do not take double or extra doses. What may interact with this medicine? Do not take this medicine with any of the following medications: -other gabapentin products This medicine may also interact with the following medications: -alcohol -antacids -antihistamines for allergy, cough and cold -certain medicines for anxiety or sleep -certain medicines for depression or psychotic disturbances -homatropine; hydrocodone -naproxen -narcotic medicines (opiates) for pain -phenothiazines like chlorpromazine, mesoridazine, prochlorperazine, thioridazine This list may not describe all possible interactions. Give your health care provider a list of all the medicines, herbs, non-prescription drugs, or dietary supplements you use. Also tell them if you smoke, drink alcohol, or use illegal drugs. Some items may interact with your medicine. What should I watch for while using this medicine? Visit your doctor or health care professional for regular checks on your progress. You may want to keep a record at home of how you feel your condition is responding to treatment. You  may want to share this information with your doctor or health care professional at each visit. You should contact your doctor or health care professional if your seizures get worse or if you have any new types of seizures. Do not stop taking this medicine or any of your seizure medicines unless instructed by your doctor or health  care professional. Stopping your medicine suddenly can increase your seizures or their severity. Wear a medical identification bracelet or chain if you are taking this medicine for seizures, and carry a card that lists all your medications. You may get drowsy, dizzy, or have blurred vision. Do not drive, use machinery, or do anything that needs mental alertness until you know how this medicine affects you. To reduce dizzy or fainting spells, do not sit or stand up quickly, especially if you are an older patient. Alcohol can increase drowsiness and dizziness. Avoid alcoholic drinks. Your mouth may get dry. Chewing sugarless gum or sucking hard candy, and drinking plenty of water will help. The use of this medicine may increase the chance of suicidal thoughts or actions. Pay special attention to how you are responding while on this medicine. Any worsening of mood, or thoughts of suicide or dying should be reported to your health care professional right away. Women who become pregnant while using this medicine may enroll in the [place] Pregnancy Registry by calling [phone]. This registry collects information about the safety of antiepileptic drug use during pregnancy. What side effects may I notice from receiving this medicine? Side effects that you should report to your doctor or health care professional as soon as possible: -allergic reactions like skin rash, itching or hives, swelling of the face, lips, or tongue -worsening of mood, thoughts or actions of suicide or dying Side effects that usually do not require medical attention (report to your doctor or health care professional if they continue or are bothersome): -constipation -difficulty walking or controlling muscle movements -dizziness -nausea -slurred speech -tiredness -tremors -weight gain This list may not describe all possible side effects. Call your doctor for medical advice about side effects. You may  report side effects to FDA at 1-800-FDA-1088. Where should I keep my medicine? Keep out of reach of children. This medicine may cause accidental overdose and death if it taken by other adults, children, or pets. Mix any unused medicine with a substance like cat litter or coffee grounds. Then throw the medicine away in a sealed container like a sealed bag or a coffee can with a lid. Do not use the medicine after the expiration date. Store at room temperature between 15 and 30 degrees C (59 and 86 degrees F). NOTE: This sheet is a summary. It may not cover all possible information. If you have questions about this medicine, talk to your doctor, pharmacist, or health care provider.  2018 Elsevier/Gold Standard (2013-10-28 15:26:50)

## 2017-09-22 NOTE — Progress Notes (Signed)
PATIENT: [name] DOB: 10-Jul-1941  REASON FOR VISIT: follow up HISTORY FROM: patient  HISTORY OF PRESENT ILLNESS: Today 09/22/17 Ms. [name] is a 77 year old female with tension type headaches.  She returns today for follow-up.  She states that her headache started 3 months ago.  She states that she has been doing neck exercises and using Fioricet she has not gotten much relief.  She states that the pain is daily but does come and go.  He states that she has certain pressure points.  She can press in the occipital region and elicit pain as well as above the ears in the temporal region and on the side of the nasal bridge.  She states it feels as if she has a band around the head that is squeezing.  She has found that caffeine, alcohol and CBD flowers are beneficial for her headaches.  She reports that she has new symptoms that started about 2-3 weeks ago.  She describes it as a numbness and tingling sensation in the mouth.  Reports that originally started on the left side of the tongue and then moved to the right side and then the roof of the mouth.  She also feels the sensation in the gums.  She denies any facial pain.  She does have a head tremor.  She reports in the last 6 months she is had blepharitis as well as rosacea.  She returns today for an evaluation.  HISTORY [name] is a 77 year old female, seen in refer by her primary care doctor Levin Erp, for evaluation of headaches, initial evaluation was on September 03, 2017.  I reviewed and summarized the referring note, she had a history of atrial fibrillation on chronic Eliquis treatment, also history of mitral valve repair in 2009 for mitral valve regurgitation,  pacemaker placement. She also has IBS, had significant GI issue over the summer, was treated with antibiotics.  She complains of a headache since November 2018, constant low-grade pressure like a tight band surrounding her temporal, occipital, frontal region,  tenderness of nuchal line upon deep palpation, improved by drink coffee, and a low-dose of alcohol,  The best time is in the early morning, seems to get worse as day goes by,  She denied previous history of headaches, no visual loss, no chewing difficulty, no lateralized motor or sensory deficit, ESR C-reactive protein was normal on July 29, 2017   She also has a history of gradual onset head titubation, her son has bilateral hands postural tremor, she has bilateral tinnitus, wearing hearing aid,  CT head without contrast at [place] on August 19, 2017 showed mild atrophy, no acute abnormality  REVIEW OF SYSTEMS: Out of a complete 14 system review of symptoms, the patient complains only of the following symptoms, and all other reviewed systems are negative.  See HPI  ALLERGIES: Allergies  Allergen Reactions  . Amiodarone     Severe rash and upset stomach  . Doxycycline     Unknown - pt had other issues going on at the time, unsure if this medication had anything to do with how she felt    HOME MEDICATIONS: Outpatient Medications Prior to Visit  Medication Sig Dispense Refill  . acetaminophen (TYLENOL 8 HOUR) 650 MG CR tablet Take 650 mg by mouth. Taking 3 tablets daily.    Marland Kitchen ALPRAZolam (XANAX) 0.25 MG tablet Take 1 tablet (0.25 mg) at bedtime    . amoxicillin (AMOXIL) 500 MG tablet Take 2,000 mg by mouth  as directed. Take 1 hour before dental procedures    . apixaban (ELIQUIS) 5 MG TABS tablet Take 1 tablet (5 mg total) by mouth 2 (two) times daily. 60 tablet 11  . butalbital-acetaminophen-caffeine (FIORICET, ESGIC) 50-325-40 MG tablet Take 1 tablet by mouth every 6 (six) hours as needed for headache. 15 tablet 3  . Calcium Carbonate-Vitamin D (CALCIUM + D PO) Take 600 mg by mouth 2 (two) times daily.     . cycloSPORINE (RESTASIS) 0.05 % ophthalmic emulsion Place 1 drop into both eyes 2 (two) times daily.    . diclofenac sodium (VOLTAREN) 1 % GEL Apply 4 g  topically 2 (two) times daily.    . diphenhydrAMINE (BENADRYL) 25 mg capsule Take 50 mg by mouth at bedtime.     . Eyelid Cleansers (AVENOVA) 0.01 % SOLN daily.    . Glucosamine HCl (GLUCOSAMINE PO) Take by mouth daily.    Marland Kitchen latanoprost (XALATAN) 0.005 % ophthalmic solution Place 1 drop into both eyes at bedtime.    Marland Kitchen loperamide (IMODIUM) 2 MG capsule Take 4 mg by mouth 3 (three) times daily as needed for diarrhea or loose stools.    . magnesium oxide (MAG-OX) 400 MG tablet Take 1 tablet (400 mg total) by mouth 2 (two) times daily. 30 tablet 6  . predniSONE (DELTASONE) 10 MG tablet Take 5 mg by mouth 3 (three) times a week.    . Probiotic Product (ALIGN PO) Take by mouth daily.    . valACYclovir (VALTREX) 500 MG tablet Take 500 mg by mouth every other day.     . verapamil (CALAN-SR) 120 MG CR tablet Take 1 tablet (120 mg total) by mouth 2 (two) times daily. 60 tablet 9  . Wheat Dextrin (BENEFIBER PO) Take by mouth daily.     No facility-administered medications prior to visit.     PAST MEDICAL HISTORY: Past Medical History:  Diagnosis Date  . Asymmetric septal hypertrophy (HCC)   . Atrial fibrillation ([place])    with permanent pacemaker  . Blepharitis 10/2016   Dr.Groat  . Current use of long term anticoagulation   . Endometriosis   . Glaucoma   . H/O mitral valve repair   . Headache   . 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 IBS (irritable bowel syndrome) 12/2002  . Irregular heart beat   . 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 Rib fracture 01/2017   Dr.Green  . Severe mitral regurgitation     PAST SURGICAL HISTORY: Past Surgical History:  Procedure Laterality Date  . APPENDECTOMY  1952  . HYSTEROSCOPY  06/2002   Polyp; Small fibroid  . HYSTEROSCOPY  09/10/09   Myoma  . MITRAL VALVE REPAIR      (plication of posterior leaflet with 28-mm Medtronic CG Future  Band annuloplasty). (02/23/08  Dr.  Roxy Manns)  . MYOMECTOMY     median sternotomy  . PACEMAKER INSERTION  02/2008  . TONSILLECTOMY AND ADENOIDECTOMY  1955  . TUBAL LIGATION  1983    FAMILY HISTORY: Family History  Problem Relation Age of Onset  . Breast cancer Mother 60  . Heart disease Mother   . Breast cancer Maternal Grandmother 76  . Coronary artery disease Brother        n his 7s with triple bypass   . Coronary artery disease Brother        with 2 vessel coronary disease at age  34  . Heart attack Father     SOCIAL HISTORY: Social History   Socioeconomic History  . Marital status: Married    Spouse name: Not on file  . Number of children: 1  . Years of education: 47  . Highest education level: Master's degree (e.g., MA, MS, MEng, MEd, MSW, MBA)  Social Needs  . Financial resource strain: Not on file  . Food insecurity - worry: Not on file  . Food insecurity - inability: Not on file  . Transportation needs - medical: Not on file  . Transportation needs - non-medical: Not on file  Occupational History  . Occupation: Retired  Tobacco Use  . Smoking status: Former Research scientist (life sciences)  . Smokeless tobacco: Never Used  . Tobacco comment: QUIT 45+ YEARS AGO  Substance and Sexual Activity  . Alcohol use: Yes    Comment: 1 shot of bourbon most days, occ wine  . Drug use: Yes    Types: Marijuana    Comment: Occasional use  . Sexual activity: Yes    Partners: Male    Birth control/protection: Post-menopausal  Other Topics Concern  . Not on file  Social History Narrative   Family History:   Last updated: 12/07/2008   Is remarkable for brother in his 42s with triple bypass and another brother with 2 vessel coronary disease at age 16.  Her father died at a later age with a myocardial infarction.       Social History:   Last updated: 12/07/2008   The patient is married and lives here in [place] with her husband.  They have 1 grown child.  She is a retired Network engineer at [employer].  Her follow is Dr. Mendel Ryder.  She is a nonsmoker, although she did smoke briefly during her youth and quit smoking during her 61s.  She denies excessive alcohol consumption.       Right-haned.   Tea or coffee occasionally.         PHYSICAL EXAM  Vitals:   09/22/17 0838  BP: (!) 142/60  Pulse: 70  Weight: 114 lb 3.2 oz (51.8 kg)   Body mass index is 22.3 kg/m.  Generalized: Well developed, in no acute distress  HEENT: Oral cavity is normal  Neurological examination  Mentation: Alert oriented to time, place, history taking. Follows all commands speech and language fluent Cranial nerve II-XII: Pupils were equal round reactive to light. Extraocular movements were full, visual field were full on confrontational test. Facial sensation and strength were normal. Uvula tongue midline. Head turning and shoulder shrug  were normal and symmetric. Motor: The motor testing reveals 5 over 5 strength of all 4 extremities. Good symmetric motor tone is noted throughout.  Sensory: Sensory testing is intact to soft touch on all 4 extremities. No evidence of extinction is noted.  Coordination: Cerebellar testing reveals good finger-nose-finger and heel-to-shin bilaterally.  Gait and station: Gait is normal. Tandem gait is normal. Romberg is negative. No drift is seen.  Reflexes: Deep tendon reflexes are symmetric and normal bilaterally.   DIAGNOSTIC DATA (LABS, IMAGING, TESTING) - I reviewed patient records, labs, notes, testing and imaging myself where available.  Lab Results  Component Value Date   WBC 7.5 05/04/2017   HGB 11.1 (L) 05/04/2017   HCT 33.2 (L) 05/04/2017   MCV 94.9 05/04/2017   PLT 211 05/04/2017      Component Value Date/Time   NA 138 05/05/2017 0411   K 3.6 05/05/2017 0411   CL  108 05/05/2017 0411   CO2 26 05/05/2017 0411   GLUCOSE 86 05/05/2017 0411   BUN 8 05/05/2017 0411   CREATININE 0.64 05/05/2017 0411   CREATININE 0.92 02/28/2016 1405   CALCIUM 8.3 (L) 05/05/2017 0411   PROT 7.4  05/02/2017 0841   ALBUMIN 3.9 05/02/2017 0841   AST 21 05/02/2017 0841   ALT 20 05/02/2017 0841   ALKPHOS 67 05/02/2017 0841   BILITOT 1.3 (H) 05/02/2017 0841   GFRNONAA >60 05/05/2017 0411   GFRAA >60 05/05/2017 0411   Lab Results  Component Value Date   CHOL 187 08/10/2007   HDL 68.6 08/10/2007   LDLCALC 94 08/10/2007   LDLDIRECT 135.1 04/05/2007   TRIG 121 08/10/2007   CHOLHDL 2.7 CALC 08/10/2007      ASSESSMENT AND PLAN 77 y.o. year old female  has a past medical history of Asymmetric septal hypertrophy ([place]), Atrial fibrillation ([place]), Blepharitis (10/2016), Current use of long term anticoagulation, Endometriosis, Glaucoma, H/O mitral valve repair, Headache, HSV (herpes simplex virus) infection, IBS (irritable bowel syndrome) (12/2002), Irregular heart beat, Pacemaker (02/2008), Rib fracture (01/2017), and Severe mitral regurgitation. here with :  1.  Tension headaches 2.  Burning and numbness in the mouth  The patient reported new symptom of burning and numbness in the oral cavity for the last 2-3 weeks.  I will check blood work today for nutritional deficiencies that could be potentially causing the symptoms.  We discussed starting gabapentin to help with tension headaches as well as burning and numbness in the mouth.  She will begin by taking 100 mg at bedtime for the first week and if tolerating will increase to 100 mg twice a day thereafter.  I have reviewed side effects of gabapentin with the patient and provided her with a handout.  If her symptoms worsen or she develops new symptoms she should let us know.   Ward Givens, MSN, NP-C 09/22/2017, 8:20 AM [facility] [address], [place], [place] [address] [phone]

## 2017-09-23 NOTE — Progress Notes (Signed)
I have reviewed and agreed above plan. 

## 2017-09-25 ENCOUNTER — Encounter: Payer: Self-pay | Admitting: Adult Health

## 2017-09-25 LAB — FOLATE

## 2017-09-25 LAB — VITAMIN B6: Vitamin B6: 59.8 ug/L — ABNORMAL HIGH (ref 2.0–32.8)

## 2017-09-25 LAB — IRON AND TIBC
IRON SATURATION: 23 % (ref 15–55)
IRON: 66 ug/dL (ref 27–139)
Total Iron Binding Capacity: 289 ug/dL (ref 250–450)
UIBC: 223 ug/dL (ref 118–369)

## 2017-09-25 LAB — ZINC: Zinc: 79 ug/dL (ref 56–134)

## 2017-09-25 LAB — FERRITIN: FERRITIN: 98 ng/mL (ref 15–150)

## 2017-09-25 LAB — VITAMIN B12: Vitamin B-12: 1260 pg/mL — ABNORMAL HIGH (ref 232–1245)

## 2017-09-28 ENCOUNTER — Telehealth: Payer: Self-pay | Admitting: *Deleted

## 2017-09-28 NOTE — Telephone Encounter (Signed)
Received My chart message from patient asking about her lab results and about Gabapentin. Spoke with her and informed her that her lab work is unremarkable with the exception of an elevated vitamin B6. Inquired patient if she is taking over-the-counter B6 supplementation. If so she may consider reducing her dose. She stated she takes a MVI but isn't sure if it contains B6. This RN advised she check the label and look for MVI with lower level of B6 or without B6.  Also informed her that her B12 is slightly elevated, however this does not cause any concern.  Patient asked if high B6 could be causing her symptoms. This RN advised per website it may  be cause, but this RN could not say with certainty. Advised she reduce her B6 intake. Re: Gabapentin; advised she has not been taking it long enough to determine it's effectiveness. She needs to take 2-4 weeks. Also reminded her of NP's instructions, that she may increase to 200 mg daily if tolerating it well. Patient stated she preferred to take it at bedtime; this RN stated she may do that instead of taking 100 mg twice daily. Advised she call for any concerns, problems. Patient verbalized understanding, appreciation.

## 2017-09-30 ENCOUNTER — Encounter: Payer: Self-pay | Admitting: Adult Health

## 2017-10-01 ENCOUNTER — Encounter: Payer: Self-pay | Admitting: Adult Health

## 2017-10-23 ENCOUNTER — Encounter: Payer: Self-pay | Admitting: Internal Medicine

## 2017-11-17 ENCOUNTER — Ambulatory Visit: Payer: Medicare Other | Admitting: Neurology

## 2017-12-17 ENCOUNTER — Encounter: Payer: Self-pay | Admitting: Internal Medicine

## 2017-12-18 IMAGING — CT CT HEAD WO/W CM
2 of 3 series · 15 of 40 positions shown, 18 images · IV contrast (APPLIED)
Comparison: None.

CLINICAL DATA: Vasculitis. Three week history of pain in the hands
and feet.

Creatinine was obtained on site at [HOSPITAL] at [HOSPITAL].Results: Creatinine 1.2 mg/dL.
EXAM:
CT HEAD WITHOUT AND WITH CONTRAST
TECHNIQUE: Contiguous axial images were obtained from the base of the skull
through the vertex without and with intravenous contrast
CONTRAST:  50mL D3SBW3-JRR IOPAMIDOL (D3SBW3-JRR) INJECTION 61%

[Series 2: head w/(date) · axial · 0.39mm/px · z∈[-156,-31]mm · 12 of 31 slices shown, 15 images]
[im 3/31  brain]
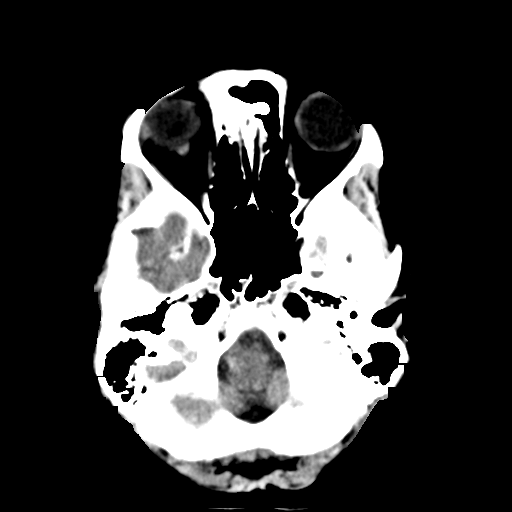
[im 3/31  bone]
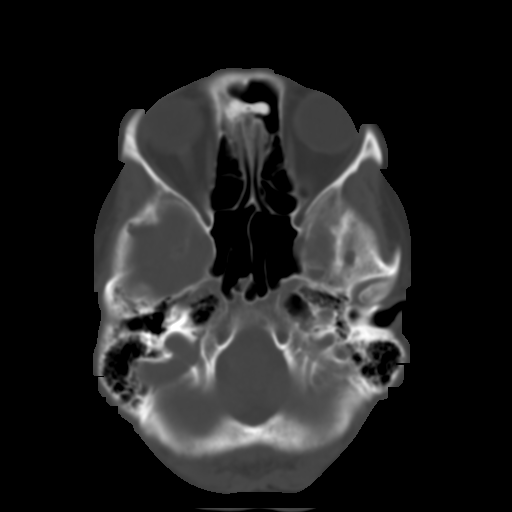
[im 5/31  brain]
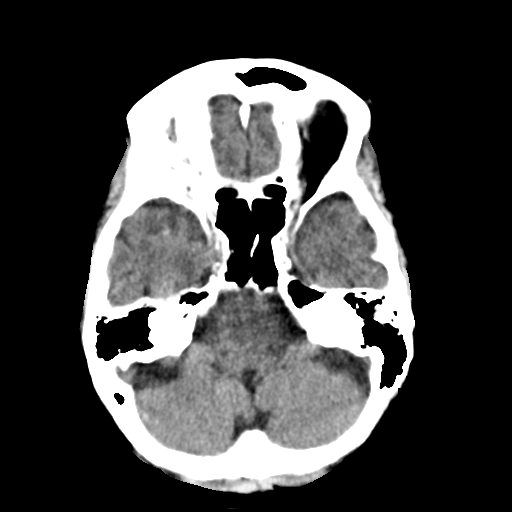
[im 7/31  brain]
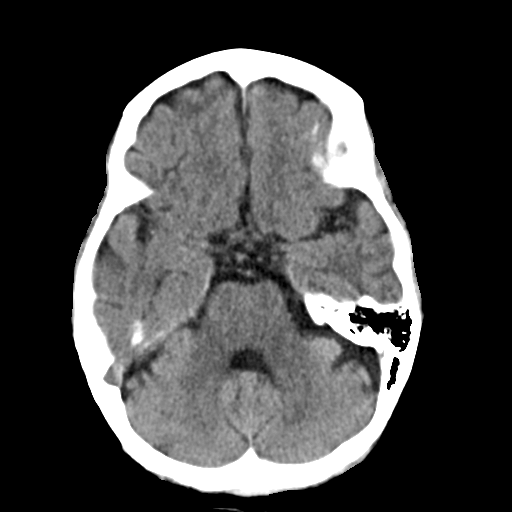
[im 9/31  brain]
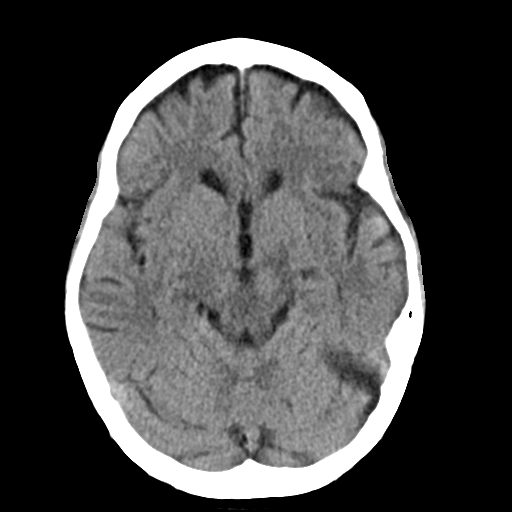
[im 11/31  brain]
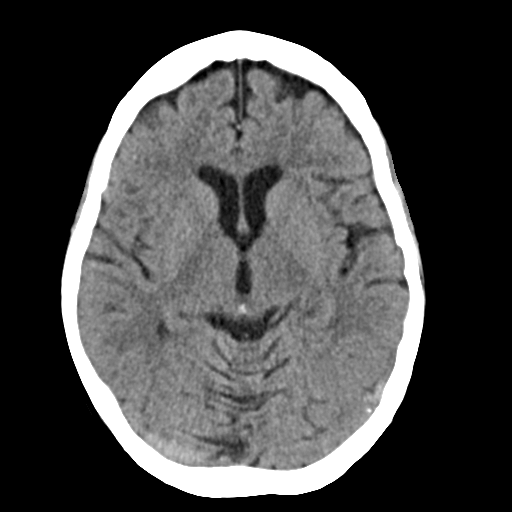
[im 11/31  bone]
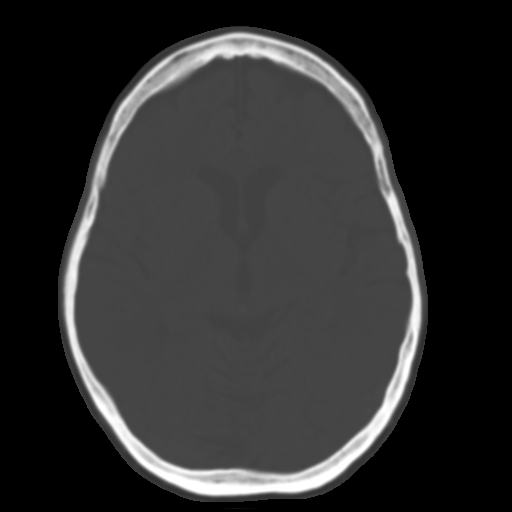
[im 13/31  brain]
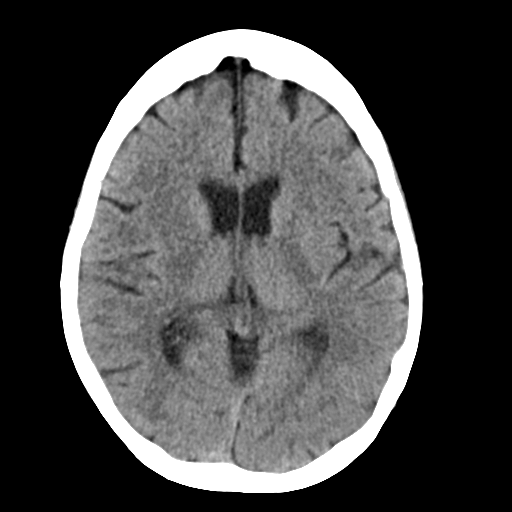
[im 18/31  brain]
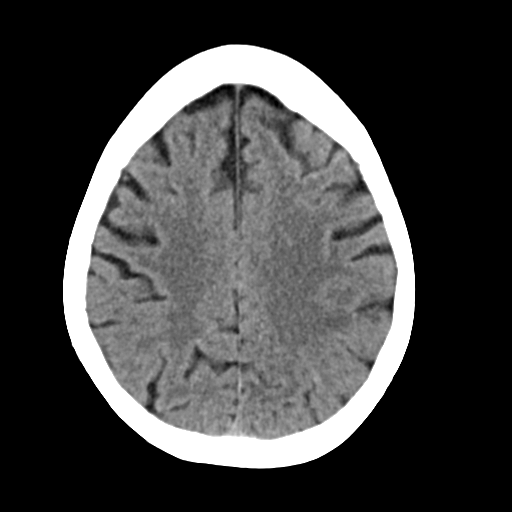
[im 20/31  brain]
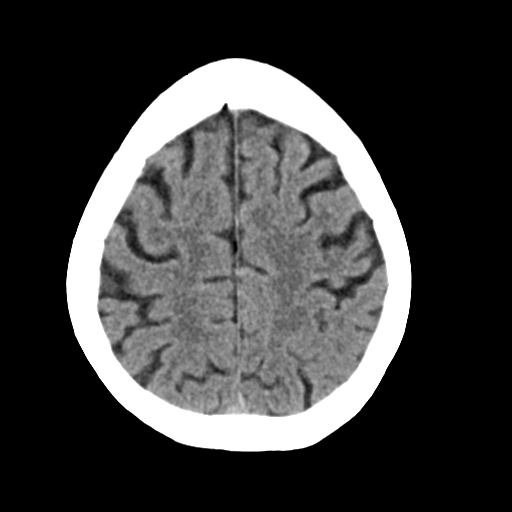
[im 22/31  brain]
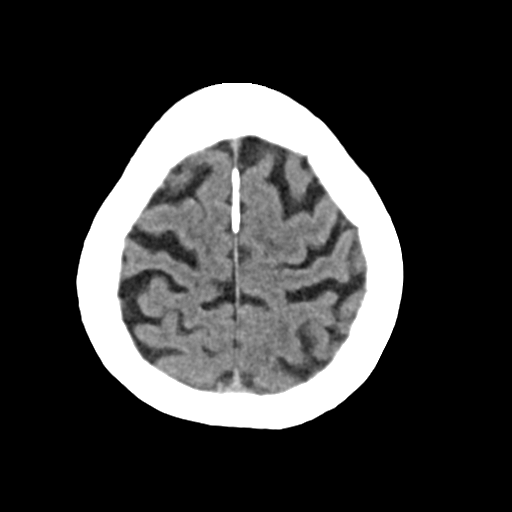
[im 22/31  bone]
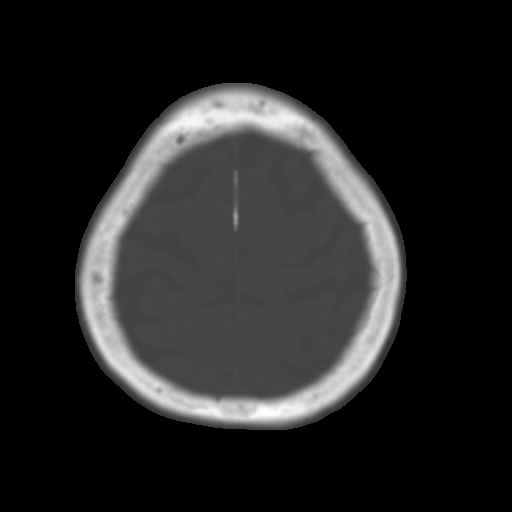
[im 24/31  brain]
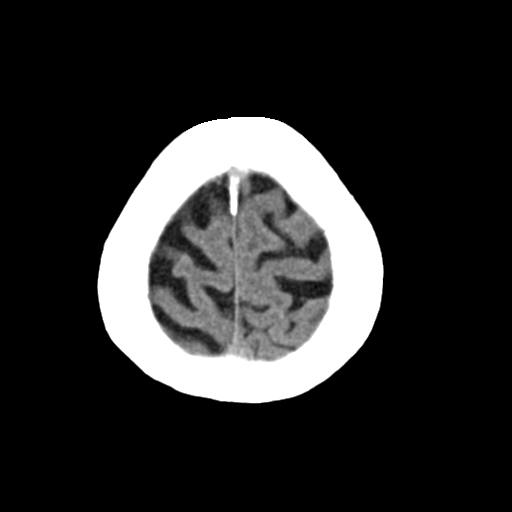
[im 26/31  brain]
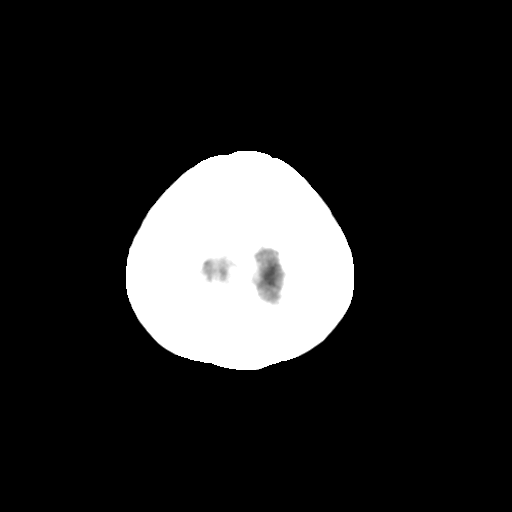
[im 28/31  brain]
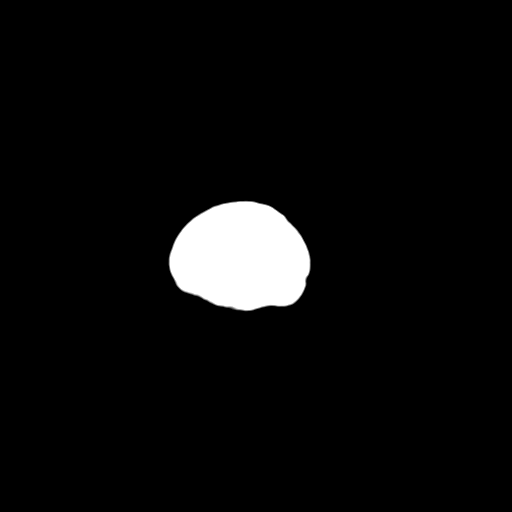

[Series 602: cor · coronal · 0.44mm/px · 3 of 96 slices shown]
[im 32/96  brain]
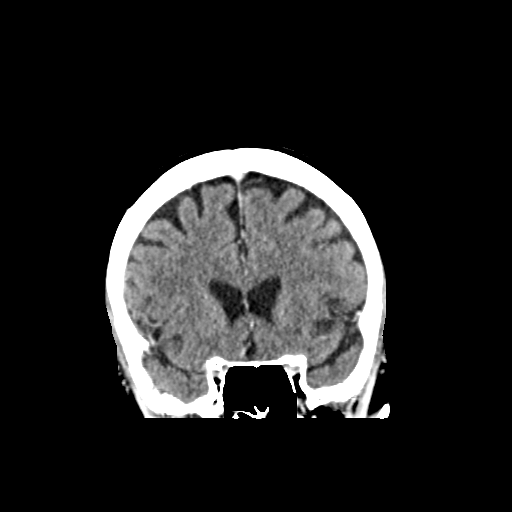
[im 43/96  brain]
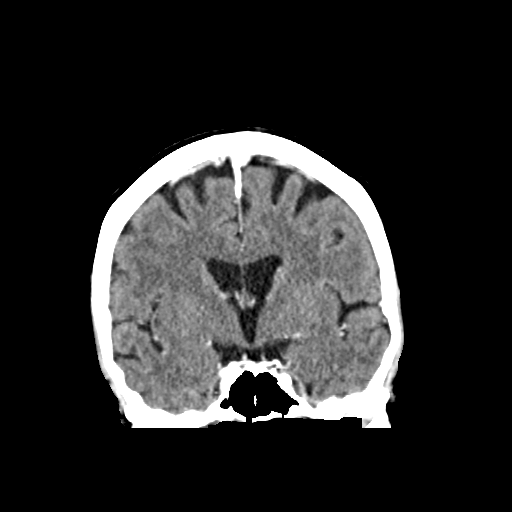
[im 53/96  brain]
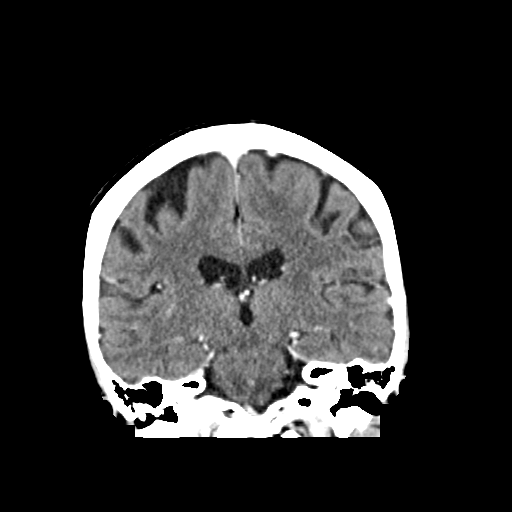

[15 of 40 positions shown; findings below may reference images not displayed]

FINDINGS: There is mild generalized age related volume loss. No evidence of
old or acute focal infarction, mass lesion, hemorrhage,
hydrocephalus or extra-axial collection. After contrast
administration, no abnormal enhancement is seen. Major vessels at
the base of the brain show flow. No calvarial abnormality. Sinuses,
middle ears and mastoids are clear. There is atherosclerotic
calcification of the major vessels at the base of the brain.
IMPRESSION: Mild age related atrophy. No focal or acute brain finding.
Atherosclerosis of the major vessels the base of the brain.

## 2018-01-06 ENCOUNTER — Encounter: Payer: Self-pay | Admitting: Internal Medicine

## 2018-01-07 ENCOUNTER — Ambulatory Visit (INDEPENDENT_AMBULATORY_CARE_PROVIDER_SITE_OTHER): Payer: Medicare Other | Admitting: *Deleted

## 2018-01-07 DIAGNOSIS — I495 Sick sinus syndrome: Secondary | ICD-10-CM | POA: Diagnosis not present

## 2018-01-07 DIAGNOSIS — Z950 Presence of cardiac pacemaker: Secondary | ICD-10-CM | POA: Diagnosis not present

## 2018-01-07 LAB — CUP PACEART INCLINIC DEVICE CHECK
Brady Statistic RA Percent Paced: 84 %
Date Time Interrogation Session: 20190425133720
Implantable Lead Implant Date: 20090617
Implantable Lead Location: 753860
Lead Channel Pacing Threshold Amplitude: 0.5 V
Lead Channel Sensing Intrinsic Amplitude: 5 mV
Lead Channel Setting Pacing Amplitude: 2 V
MDC IDC LEAD IMPLANT DT: 20090617
MDC IDC LEAD LOCATION: 753859
MDC IDC MSMT BATTERY IMPEDANCE: 3400 Ohm
MDC IDC MSMT BATTERY VOLTAGE: 2.76 V
MDC IDC MSMT LEADCHNL RA IMPEDANCE VALUE: 431 Ohm
MDC IDC MSMT LEADCHNL RA PACING THRESHOLD PULSEWIDTH: 0.4 ms
MDC IDC PG IMPLANT DT: 20090617
Pulse Gen Model: 5826
Pulse Gen Serial Number: 1237757

## 2018-01-07 NOTE — Progress Notes (Signed)
Pacemaker check in clinic. Normal device function. Thresholds, sensing, impedances consistent with previous measurements. Device programmed to maximize longevity. 160 AT/AF episodes, +Eliquis. No episode triggers enabled. Device programmed at appropriate safety margins. Histogram distribution appropriate for patient activity level. Device programmed to optimize intrinsic conduction. Estimated longevity 2.75-3.5 years. Patient education completed. ROV with SK in 06/2018.

## 2018-01-07 NOTE — Patient Instructions (Signed)
Call [employer] technical support to ask about any known interference with a foot bath.  Their number is [phone].

## 2018-01-26 ENCOUNTER — Other Ambulatory Visit: Payer: Self-pay | Admitting: Internal Medicine

## 2018-01-26 NOTE — Telephone Encounter (Signed)
Pt is a 77 yr old female who last saw Dr Caryl Comes on  06/2017. Her last weight was 51.8Kg on 09/22/17. SCr was 0.64 on 05/05/17. Will refill her Eliquis 5mg  BID for 3 mths.

## 2018-02-25 ENCOUNTER — Other Ambulatory Visit: Payer: Self-pay | Admitting: Internal Medicine

## 2018-04-06 ENCOUNTER — Other Ambulatory Visit: Payer: Self-pay | Admitting: Internal Medicine

## 2018-04-06 NOTE — Telephone Encounter (Signed)
Pt last saw Dr Caryl Comes 07/02/17, last labs 05/05/17 Creat 0.64, age 77, weight 51.8kg, based on specified criteria pt is on appropriate dosage of Eliquis 5mg  BID.  Will refill rx.

## 2018-07-03 ENCOUNTER — Other Ambulatory Visit: Payer: Self-pay | Admitting: Internal Medicine

## 2018-07-05 ENCOUNTER — Other Ambulatory Visit: Payer: Self-pay | Admitting: Internal Medicine

## 2018-07-30 ENCOUNTER — Encounter: Payer: Self-pay | Admitting: Internal Medicine

## 2018-07-30 ENCOUNTER — Ambulatory Visit: Payer: Medicare Other | Admitting: Internal Medicine

## 2018-07-30 VITALS — BP 120/62 | HR 70 | Ht 60.0 in | Wt 109.4 lb

## 2018-07-30 DIAGNOSIS — I422 Other hypertrophic cardiomyopathy: Secondary | ICD-10-CM | POA: Diagnosis not present

## 2018-07-30 DIAGNOSIS — Z950 Presence of cardiac pacemaker: Secondary | ICD-10-CM

## 2018-07-30 DIAGNOSIS — R002 Palpitations: Secondary | ICD-10-CM | POA: Diagnosis not present

## 2018-07-30 DIAGNOSIS — I480 Paroxysmal atrial fibrillation: Secondary | ICD-10-CM

## 2018-07-30 DIAGNOSIS — I493 Ventricular premature depolarization: Secondary | ICD-10-CM

## 2018-07-30 NOTE — Progress Notes (Signed)
Patient Care Team: Levin Erp, MD as PCP - General (Internal Medicine)   HPI  [name] is a 77 y.o. female Seen in follow-up for palpitations. 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    She was seen a few weeks ago because of the palpitations as noted. Her device was programmed to activate AV hysteresis. , we reprogrammed her device DDDR--DDIR to eliminate ventricular pacing as a potential explanation for her palpitations. Most recently reprogrammed her AAIR.  She continued with significant palpitations. An event recorder to elucidate the mechanism of her symptoms. >>>PVCs  PVCs Rx with verapamil  Date Cr  K Mg  6/18 0.78 4.0 2.0         DATE TEST    10/15    Echo   EF 55-60 % Mod AI  10/17    Echo   EF 55-60 % Mod AI         She has felt better than in a long time   Underwent chelation  Past Medical History:  Diagnosis Date  . Asymmetric septal hypertrophy (HCC)   . Atrial fibrillation ([place])    with permanent pacemaker  . Blepharitis 10/2016   Dr.Groat  . Current use of long term anticoagulation   . Endometriosis   . Glaucoma   . H/O mitral valve repair   . Headache   . 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 IBS (irritable bowel syndrome) 12/2002  . Irregular heart beat   . 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 Rib fracture 01/2017   Dr.Green  . Severe mitral regurgitation     Past Surgical History:  Procedure Laterality Date  . APPENDECTOMY  1952  . HYSTEROSCOPY  06/2002   Polyp; Small fibroid  . HYSTEROSCOPY  09/10/09   Myoma  . MITRAL VALVE REPAIR      (plication of posterior leaflet with 28-mm Medtronic CG Future  Band annuloplasty). (02/23/08  Dr.  Roxy Manns)  . MYOMECTOMY     median sternotomy  . PACEMAKER INSERTION  02/2008  . TONSILLECTOMY AND ADENOIDECTOMY  1955  . TUBAL LIGATION  1983    Current Outpatient Medications  Medication Sig Dispense Refill  . acetaminophen (TYLENOL 8 HOUR) 650 MG CR tablet Take 650 mg by mouth. Taking 3 tablets daily.    Marland Kitchen amoxicillin (AMOXIL) 500 MG tablet Take 2,000 mg by mouth as directed. Take 1 hour before dental procedures    . Calcium Carbonate-Vitamin D (CALCIUM + D PO) Take 600 mg by mouth 2 (two) times daily.     . Cholecalciferol (VITAMIN D HIGH POTENCY PO) Take 10,000 Int'l Units by mouth 2 (two) times a week.    . cycloSPORINE (RESTASIS) 0.05 % ophthalmic emulsion Place 1 drop into both eyes 2 (two) times daily.    . diphenhydrAMINE (BENADRYL) 25 mg capsule Take 50 mg by mouth at bedtime.     Marland Kitchen ELIQUIS 5 MG TABS tablet TAKE 1 TABLET BY MOUTH TWO  TIMES DAILY 180 tablet 1  . Eyelid Cleansers (AVENOVA) 0.01 % SOLN daily.    Marland Kitchen latanoprost (XALATAN) 0.005 % ophthalmic solution Place 1 drop into both eyes at bedtime.    Marland Kitchen LITHIUM PO Take 5 mg  by mouth daily.    . magnesium oxide (MAG-OX) 400 MG tablet Take 1 tablet (400 mg total) by mouth 2 (two) times daily. 30 tablet 6  . Omega-3 Fatty Acids (FISH OIL) 1000 MG CAPS Take 2 capsules by mouth daily.    . Probiotic Product (ALIGN PO) Take by mouth daily.    . valACYclovir (VALTREX) 500 MG tablet Take 500 mg by mouth every other day.     . verapamil (CALAN-SR) 120 MG CR tablet Take 1 tablet (120 mg total) by mouth 2 (two) times daily. Please keep upcoming appt in November with Dr. Caryl Comes for future refills.Thank you 60 tablet 0  . vitamin k 100 MCG tablet Take 100 mcg by mouth daily.    . Wheat Dextrin (BENEFIBER PO) Take by mouth daily.     No current facility-administered medications for this visit.     Allergies  Allergen Reactions  . Amiodarone     Severe rash and upset stomach  . Doxycycline     Unknown - pt had other issues going on at the  time, unsure if this medication had anything to do with how she felt    Review of Systems negative except from HPI and PMH  Physical Exam BP 120/62   Pulse 70   Ht 5' (1.524 m)   Wt 109 lb 6.4 oz (49.6 kg)   SpO2 95%   BMI 21.37 kg/m  Well developed and nourished in no acute distress HENT normal Neck supple with JVP-flat Clear Regular rate and rhythm, no murmurs or gallops Abd-soft with active BS No Clubbing cyanosis edema Skin-warm and dry A & Oriented  Grossly normal sensory and motor function       Assessment and  Plan  Hypertrophic cardiomyopathy status post myectomy  Family  issues have been addressed  Aortic insufficiency-moderate with normal left ventricular size  Paroxysmal atrial fibrillation   Pacemaker-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

## 2018-07-30 NOTE — Patient Instructions (Signed)
Medication Instructions:  Your physician recommends that you continue on your current medications as directed. Please refer to the Current Medication list given to you today.  Labwork: None ordered.  Testing/Procedures: Your physician has requested that you have an echocardiogram. Echocardiography is a painless test that uses sound waves to create images of your heart. It provides your doctor with information about the size and shape of your heart and how well your heart's chambers and valves are working. This procedure takes approximately one hour. There are no restrictions for this procedure.   Follow-Up: Your physician recommends that you schedule a follow-up appointment in:   6 months with the device clinic  One Year with Dr Caryl Comes, you will need an Echo about 3 weeks before your appointment.     Any Other Special Instructions Will Be Listed Below (If Applicable).     If you need a refill on your cardiac medications before your next appointment, please call your pharmacy.

## 2018-08-01 ENCOUNTER — Other Ambulatory Visit: Payer: Self-pay | Admitting: Internal Medicine

## 2018-08-17 ENCOUNTER — Other Ambulatory Visit: Payer: Self-pay | Admitting: Family Medicine

## 2018-08-17 ENCOUNTER — Ambulatory Visit
Admission: RE | Admit: 2018-08-17 | Discharge: 2018-08-17 | Disposition: A | Discharge status: Home / Self Care | Payer: Medicare Other | Source: Ambulatory Visit | Attending: Family Medicine | Admitting: Family Medicine

## 2018-08-17 DIAGNOSIS — Q782 Osteopetrosis: Secondary | ICD-10-CM

## 2018-08-17 DIAGNOSIS — R51 Headache: Principal | ICD-10-CM

## 2018-08-17 DIAGNOSIS — M542 Cervicalgia: Secondary | ICD-10-CM

## 2018-08-17 DIAGNOSIS — R519 Headache, unspecified: Secondary | ICD-10-CM

## 2018-08-27 ENCOUNTER — Telehealth: Payer: Self-pay | Admitting: Internal Medicine

## 2018-08-27 MED ORDER — APIXABAN 5 MG PO TABS: 5.0000 mg | ORAL_TABLET | Freq: Two times a day (BID) | ORAL | 3 refills | Status: DC

## 2018-08-27 NOTE — Telephone Encounter (Signed)
New message    *STAT* If patient is at the pharmacy, call can be transferred to refill team.   1. Which medications need to be refilled? (please list name of each medication and dose if known)   ELIQUIS 5 MG TABS tablet     2. Which pharmacy/location (including street and city if local pharmacy) is medication to be sent to?[facility] [address], [place] [address]     [phone]   3. Do they need a 30 day or 90 day supply? [place]

## 2018-08-27 NOTE — Telephone Encounter (Signed)
Pt last saw Dr Caryl Comes 07/30/18, last labs 07/01/18 Creat 0.84, age 77, weight 49.6kg, based on specified criteria pt is on appropriate dosage of Eliquis 5mg  BID.  Will refill rx.

## 2018-11-24 ENCOUNTER — Other Ambulatory Visit: Payer: Self-pay | Admitting: Internal Medicine
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
## 2019-02-04 IMAGING — CR DG ABDOMEN 1V
1 series · 1 of 1 positions shown · non-contrast
Comparison: Barium enema 02/22/2013

CLINICAL DATA: Sitz marker study day 1.

EXAM:
ABDOMEN - 1 VIEW

[t abdomen supine *]
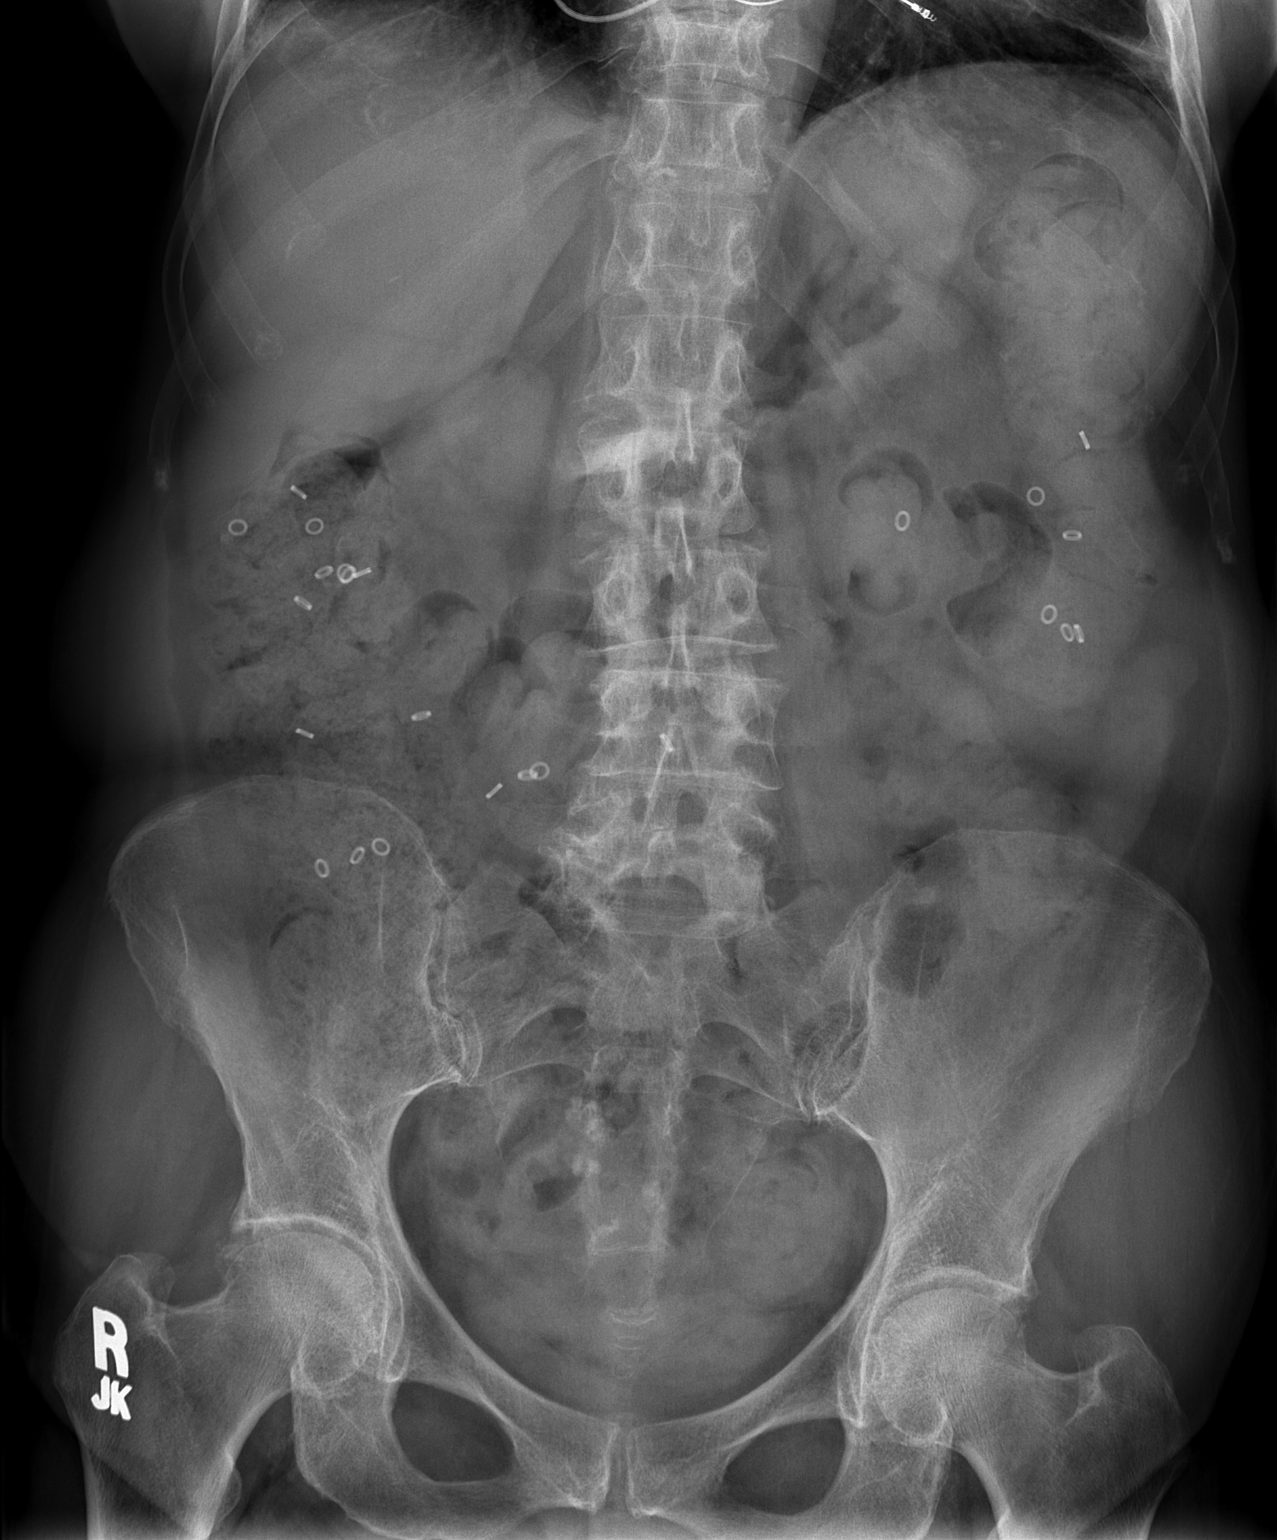

[1 of 1 positions shown; findings below may reference images not displayed]

FINDINGS: Sitz markers are scattered throughout the colon from the proximal
ascending colon to the distal transverse colon. Approximately 24
markers visualized. Moderate stool burden throughout the colon. No
organomegaly. No free air or obstruction.
IMPRESSION: Sitz marker scattered from the proximal ascending colon to the
distal transverse colon.

Moderate stool burden.

## 2019-02-10 ENCOUNTER — Other Ambulatory Visit: Payer: Self-pay

## 2019-02-10 ENCOUNTER — Ambulatory Visit (INDEPENDENT_AMBULATORY_CARE_PROVIDER_SITE_OTHER): Payer: Medicare Other | Admitting: Student

## 2019-02-10 DIAGNOSIS — Z950 Presence of cardiac pacemaker: Secondary | ICD-10-CM | POA: Diagnosis not present

## 2019-02-10 LAB — CUP PACEART INCLINIC DEVICE CHECK
Battery Impedance: 4.6 Ohm
Battery Remaining Longevity: 27 mo
Battery Voltage: 2.76 V
Brady Statistic RA Percent Paced: 81 %
Date Time Interrogation Session: 20200528155843
Implantable Lead Implant Date: 20090617
Implantable Lead Implant Date: 20090617
Implantable Lead Location: 753859
Implantable Lead Location: 753860
Implantable Pulse Generator Implant Date: 20090617
Lead Channel Impedance Value: 455 Ohm
Lead Channel Pacing Threshold Amplitude: 0.75 V
Lead Channel Pacing Threshold Pulse Width: 0.4 ms
Lead Channel Sensing Intrinsic Amplitude: 5 mV
Lead Channel Setting Pacing Amplitude: 2 V
Pulse Gen Model: 5826
Pulse Gen Serial Number: 1237757

## 2019-02-10 NOTE — Progress Notes (Signed)
Pacemaker check in clinic. Normal device function. RA Thresholds, sensing, impedances consistent with previous measurements. Pt had 352 AT/AF episodes since last check; stable for her. Longest 2 min 52 sec. On Eliquis. Device programmed at appropriate safety margins. Histogram distribution appropriate for patient activity level. Estimated longevity 2.25 yrs. Return to clinic 3 months to see Dr. Caryl Comes.   Legrand Como [address]" [place], PA-C 02/10/2019 3:59 PM

## 2019-02-16 ENCOUNTER — Other Ambulatory Visit: Payer: Self-pay | Admitting: Internal Medicine

## 2019-02-16 DIAGNOSIS — I4891 Unspecified atrial fibrillation: Secondary | ICD-10-CM

## 2019-02-16 DIAGNOSIS — Z5181 Encounter for therapeutic drug level monitoring: Secondary | ICD-10-CM

## 2019-02-17 ENCOUNTER — Encounter: Payer: Self-pay | Admitting: *Deleted

## 2019-02-17 NOTE — Progress Notes (Signed)
This encounter was created in error - please disregard.

## 2019-02-17 NOTE — Telephone Encounter (Signed)
Prescription refill request for Eliquis received.  Last office visit: 02/10/2019 [place] Dr. Caryl Comes (07-30-2018) Scr: 0.84 (07/01/2018) Age: 78 Weight: 49.6 [place] (07-30-2018)

## 2019-07-11 ENCOUNTER — Other Ambulatory Visit: Payer: Self-pay

## 2019-07-11 ENCOUNTER — Ambulatory Visit (HOSPITAL_COMMUNITY): Payer: Medicare Other | Attending: Cardiology

## 2019-07-11 DIAGNOSIS — R002 Palpitations: Secondary | ICD-10-CM | POA: Diagnosis present

## 2019-07-11 DIAGNOSIS — I422 Other hypertrophic cardiomyopathy: Secondary | ICD-10-CM | POA: Insufficient documentation

## 2019-07-11 DIAGNOSIS — Z950 Presence of cardiac pacemaker: Secondary | ICD-10-CM | POA: Insufficient documentation

## 2019-07-11 DIAGNOSIS — I480 Paroxysmal atrial fibrillation: Secondary | ICD-10-CM | POA: Diagnosis present

## 2019-07-11 DIAGNOSIS — I493 Ventricular premature depolarization: Secondary | ICD-10-CM

## 2019-07-25 ENCOUNTER — Other Ambulatory Visit: Payer: Self-pay

## 2019-07-25 MED ORDER — VERAPAMIL HCL ER 120 MG PO TBCR: 120.0000 mg | EXTENDED_RELEASE_TABLET | Freq: Two times a day (BID) | ORAL | 2 refills | Status: DC

## 2019-08-01 ENCOUNTER — Other Ambulatory Visit: Payer: Self-pay | Admitting: Internal Medicine

## 2019-08-01 DIAGNOSIS — I4891 Unspecified atrial fibrillation: Secondary | ICD-10-CM

## 2019-08-01 DIAGNOSIS — Z7901 Long term (current) use of anticoagulants: Secondary | ICD-10-CM

## 2019-08-01 NOTE — Progress Notes (Signed)
Patient Care Team: Levin Erp, MD as PCP - General (Internal Medicine)   HPI  [name] is a 78 y.o. female Seen in follow-up for palpitations. 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    She was seen a few weeks ago because of the palpitations as noted. Her device was programmed to activate AV hysteresis. , we reprogrammed her device DDDR--DDIR to eliminate ventricular pacing as a potential explanation for her palpitations. Most recently reprogrammed her AAIR.   her exercise tolerance remains good --few palpitations.  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  Date Cr  K Mg  6/18 0.78 4.0 2.0         DATE TEST EF   10/15 Echo  55-60 % Mod AI PHT 390  10/17 Echo  55-60 % Mod AI  PHT 454  10/20 Echo  55-65% Mild-Mod AI PHT 398      Past Medical History:  Diagnosis Date  . Asymmetric septal hypertrophy (HCC)   . Atrial fibrillation ([place])    with permanent pacemaker  . Blepharitis 10/2016   Dr.Groat  . Current use of long term anticoagulation   . Endometriosis   . Glaucoma   . H/O mitral valve repair   . Headache   . 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 IBS (irritable bowel syndrome) 12/2002  . Irregular heart beat   . 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 Rib fracture 01/2017   Dr.Green  . Severe mitral regurgitation     Past Surgical History:  Procedure Laterality Date  . APPENDECTOMY  1952  . HYSTEROSCOPY  06/2002   Polyp; Small fibroid  . HYSTEROSCOPY  09/10/09   Myoma  . MITRAL VALVE REPAIR      (plication of posterior leaflet with 28-mm Medtronic CG Future  Band annuloplasty).  (02/23/08  Dr. Roxy Manns)  . MYOMECTOMY     median sternotomy  . PACEMAKER INSERTION  02/2008  . TONSILLECTOMY AND ADENOIDECTOMY  1955  . TUBAL LIGATION  1983    Current Outpatient Medications  Medication Sig Dispense Refill  . acetaminophen (TYLENOL 8 HOUR) 650 MG CR tablet Take 650 mg by mouth. Taking 3 tablets daily.    . Cholecalciferol (VITAMIN D HIGH POTENCY PO) Take 10,000 Int'l Units by mouth 2 (two) times a week.    . cycloSPORINE (RESTASIS) 0.05 % ophthalmic emulsion Place 1 drop into both eyes 2 (two) times daily.    . diclofenac Sodium (VOLTAREN) 1 % GEL Apply 1 application topically 2 (two) times daily.    . diphenhydrAMINE (BENADRYL) 25 mg capsule Take 50 mg by mouth at bedtime.     Marland Kitchen ELIQUIS 5 MG TABS tablet TAKE 1 TABLET BY MOUTH  TWICE DAILY 180 tablet 1  . Eyelid Cleansers (AVENOVA) 0.01 % SOLN daily.    Marland Kitchen latanoprost (XALATAN) 0.005 % ophthalmic solution Place 1 drop into both eyes at bedtime.    Marland Kitchen MAGNESIUM CL-CALCIUM CARBONATE PO Take 1 tablet by mouth 2 (two) times daily.    . Omega-3 Fatty Acids (FISH OIL) 1000 MG CAPS Take 2 capsules by mouth daily.    Marland Kitchen  Probiotic Product (ALIGN PO) Take by mouth daily.    . valACYclovir (VALTREX) 500 MG tablet Take 500 mg by mouth every other day.     . verapamil (CALAN-SR) 120 MG CR tablet Take 1 tablet (120 mg total) by mouth 2 (two) times daily. 180 tablet 2  . VITAMIN K PO Take 1 tablet by mouth 3 (three) times a week.     No current facility-administered medications for this visit.     Allergies  Allergen Reactions  . Amiodarone     Severe rash and upset stomach  . Doxycycline     Unknown - pt had other issues going on at the time, unsure if this medication had anything to do with how she felt    Review of Systems negative except from HPI and PMH  Physical Exam BP 110/64   Pulse 92   Ht 5' (1.524 m)   Wt 114 lb (51.7 kg)   SpO2 94%   BMI 22.26 kg/m  Well developed and nourished in no acute distress HENT  normal Neck supple with JVP-flat Clear Regular rate and rhythm, no murmurs or gallops Abd-soft with active BS No Clubbing cyanosis edema Skin-warm and dry A & Oriented  Grossly normal sensory and motor function       Assessment and  Plan  Hypertrophic cardiomyopathy status post myectomy  Family  issues have been addressed  Aortic insufficiency-moderate with normal left ventricular size  Paroxysmal atrial fibrillation   Pacemaker-St. Jude  PVCs and PACs  Overall she continues to feel really good.  Her holistic physician is working on her irritable bowel syndrome.  Scant palpitations.  Device function is normal.

## 2019-08-02 ENCOUNTER — Other Ambulatory Visit: Payer: Self-pay

## 2019-08-02 ENCOUNTER — Encounter: Payer: Self-pay | Admitting: Internal Medicine

## 2019-08-02 ENCOUNTER — Ambulatory Visit (INDEPENDENT_AMBULATORY_CARE_PROVIDER_SITE_OTHER): Payer: Medicare Other | Admitting: Internal Medicine

## 2019-08-02 ENCOUNTER — Other Ambulatory Visit: Payer: Medicare Other

## 2019-08-02 VITALS — BP 110/64 | HR 92 | Ht 60.0 in | Wt 114.0 lb

## 2019-08-02 DIAGNOSIS — Z7901 Long term (current) use of anticoagulants: Secondary | ICD-10-CM

## 2019-08-02 DIAGNOSIS — Z950 Presence of cardiac pacemaker: Secondary | ICD-10-CM | POA: Diagnosis not present

## 2019-08-02 DIAGNOSIS — I495 Sick sinus syndrome: Secondary | ICD-10-CM

## 2019-08-02 DIAGNOSIS — I480 Paroxysmal atrial fibrillation: Secondary | ICD-10-CM | POA: Diagnosis not present

## 2019-08-02 DIAGNOSIS — I4891 Unspecified atrial fibrillation: Secondary | ICD-10-CM

## 2019-08-02 LAB — CUP PACEART INCLINIC DEVICE CHECK
Battery Impedance: 1000 Ohm — CL
Battery Voltage: 2.74 V
Brady Statistic RA Percent Paced: 66 %
Date Time Interrogation Session: 20201117183133
Implantable Lead Implant Date: 20090617
Implantable Lead Implant Date: 20090617
Implantable Lead Location: 753859
Implantable Lead Location: 753860
Implantable Pulse Generator Implant Date: 20090617
Lead Channel Impedance Value: 454 Ohm
Lead Channel Pacing Threshold Amplitude: 0.75 V
Lead Channel Pacing Threshold Pulse Width: 0.4 ms
Lead Channel Sensing Intrinsic Amplitude: 5 mV
Lead Channel Setting Pacing Amplitude: 2 V
Pulse Gen Model: 5826
Pulse Gen Serial Number: 1237757

## 2019-08-02 NOTE — Patient Instructions (Addendum)
Medication Instructions:   *If you need a refill on your cardiac medications before your next appointment, please call your pharmacy*  Lab Work:  If you have labs (blood work) drawn today and your tests are completely normal, you will receive your results only by: Marland Kitchen MyChart Message (if you have MyChart) OR . A paper copy in the mail If you have any lab test that is abnormal or we need to change your treatment, we will call you to review the results.  Testing/Procedures:   Follow-Up: At [facility], you and your health needs are our priority.  As part of our continuing mission to provide you with exceptional heart care, we have created designated Provider Care Teams.  These Care Teams include your primary Cardiologist (physician) and Advanced Practice Providers (APPs -  Physician Assistants and Nurse Practitioners) who all work together to provide you with the care you need, when you need it.  Your next appointment:   3 months for a device check  The format for your next appointment:    Provider:   [facility]  Other Instructions

## 2019-08-02 NOTE — Telephone Encounter (Signed)
Has OV w/ Dr. Caryl Comes today Age 78 49kg

## 2019-08-03 LAB — BASIC METABOLIC PANEL
BUN/Creatinine Ratio: 24 (ref 12–28)
BUN: 24 mg/dL (ref 8–27)
CO2: 23 mmol/L (ref 20–29)
Calcium: 10 mg/dL (ref 8.7–10.3)
Chloride: 103 mmol/L (ref 96–106)
Creatinine, Ser: 0.98 mg/dL (ref 0.57–1.00)
GFR calc Af Amer: 64 mL/min/{1.73_m2} (ref >59)
GFR calc non Af Amer: 55 mL/min/{1.73_m2} — ABNORMAL LOW (ref >59)
Glucose: 84 mg/dL (ref 65–99)
Potassium: 4.5 mmol/L (ref 3.5–5.2)
Sodium: 141 mmol/L (ref 134–144)

## 2019-08-03 LAB — CBC
Hematocrit: 43.3 % (ref 34.0–46.6)
Hemoglobin: 14.8 g/dL (ref 11.1–15.9)
MCH: 31.3 pg (ref 26.6–33.0)
MCHC: 34.2 g/dL (ref 31.5–35.7)
MCV: 92 fL (ref 79–97)
Platelets: 232 10*3/uL (ref 150–450)
RBC: 4.73 x10E6/uL (ref 3.77–5.28)
RDW: 13.1 % (ref 11.7–15.4)
WBC: 8.5 10*3/uL (ref 3.4–10.8)

## 2019-08-03 NOTE — Telephone Encounter (Signed)
Eliquis 5mg  refill request received. Pt is 78 years old, weight-51.7kg, Crea-0.98 on 08/02/2019, Diagnosis-Afib, and last seen by Dr. Caryl Comes on yesterday-08/02/2019. Dose is appropriate based on dosing criteria. Will send in refill to requested pharmacy.

## 2019-08-05 NOTE — Addendum Note (Signed)
Addended by: Rose Phi on: 08/05/2019 04:40 PM   Modules accepted: Orders

## 2019-11-10 ENCOUNTER — Ambulatory Visit (INDEPENDENT_AMBULATORY_CARE_PROVIDER_SITE_OTHER): Payer: Medicare Other | Admitting: *Deleted

## 2019-11-10 ENCOUNTER — Other Ambulatory Visit: Payer: Self-pay

## 2019-11-10 DIAGNOSIS — I421 Obstructive hypertrophic cardiomyopathy: Secondary | ICD-10-CM

## 2019-11-10 DIAGNOSIS — I495 Sick sinus syndrome: Secondary | ICD-10-CM | POA: Diagnosis not present

## 2019-11-10 LAB — CUP PACEART INCLINIC DEVICE CHECK
Battery Impedance: 5300 Ohm
Battery Voltage: 2.73 V
Brady Statistic RA Percent Paced: 71 %
Date Time Interrogation Session: 20210225164219
Implantable Lead Implant Date: 20090617
Implantable Lead Implant Date: 20090617
Implantable Lead Location: 753859
Implantable Lead Location: 753860
Implantable Pulse Generator Implant Date: 20090617
Lead Channel Impedance Value: 430 Ohm
Lead Channel Pacing Threshold Amplitude: 0.75 V
Lead Channel Pacing Threshold Pulse Width: 0.4 ms
Lead Channel Sensing Intrinsic Amplitude: 5 mV
Lead Channel Setting Pacing Amplitude: 2 V
Pulse Gen Model: 5826
Pulse Gen Serial Number: 1237757

## 2019-11-10 NOTE — Patient Instructions (Signed)
In person clinic check 02/07/20.

## 2019-11-10 NOTE — Progress Notes (Signed)
Pacemaker check in clinic. Normal device function. Thresholds, sensing, impedances consistent with previous measurements. Device programmed to maximize longevity. At/AF burden < 1%, 107 AT/AF episodes , longest lasted 13 minutes. Device programmed at appropriate safety margins. Histogram distribution appropriate for patient activity level. Device programmed to optimize intrinsic conduction. Estimated longevity 2-2.5 yrs. Patient device is not remote capable. Next in clinic device check 02/07/20. Patient education completed.

## 2020-02-23 ENCOUNTER — Other Ambulatory Visit: Payer: Self-pay

## 2020-02-23 ENCOUNTER — Ambulatory Visit (INDEPENDENT_AMBULATORY_CARE_PROVIDER_SITE_OTHER): Payer: Medicare Other | Admitting: Emergency Medicine

## 2020-02-23 DIAGNOSIS — Z950 Presence of cardiac pacemaker: Secondary | ICD-10-CM | POA: Diagnosis not present

## 2020-02-23 DIAGNOSIS — I495 Sick sinus syndrome: Secondary | ICD-10-CM

## 2020-02-23 LAB — CUP PACEART INCLINIC DEVICE CHECK
Battery Impedance: 5200 Ohm
Battery Voltage: 2.74 V
Brady Statistic RA Percent Paced: 74 %
Date Time Interrogation Session: 20210610142314
Implantable Lead Implant Date: 20090617
Implantable Lead Implant Date: 20090617
Implantable Lead Location: 753859
Implantable Lead Location: 753860
Implantable Pulse Generator Implant Date: 20090617
Lead Channel Impedance Value: 443 Ohm
Lead Channel Pacing Threshold Amplitude: 0.75 V
Lead Channel Pacing Threshold Pulse Width: 0.4 ms
Lead Channel Sensing Intrinsic Amplitude: 5 mV
Lead Channel Setting Pacing Amplitude: 2 V
Pulse Gen Model: 5826
Pulse Gen Serial Number: 1237757

## 2020-02-23 NOTE — Progress Notes (Signed)
Pacemaker check in clinic. Normal device function. Threshold, sensing, and impedance consistent with previous measurements. Device programmed to maximize longevity. 105 AT/AF episodes (<1%), longest 3.5 min, no episode triggers enabled. Device programmed at appropriate safety margins. Histogram distribution appropriate for patient activity level. Device programmed to optimize intrinsic conduction. Estimated longevity 2.0-2.75 years. Patient's device is not capable of remote monitoring. Patient education completed. ROV with Device Clinic on 05/24/20 and ROV with Dr. Caryl Comes in 07/2020.

## 2020-04-15 ENCOUNTER — Other Ambulatory Visit: Payer: Self-pay | Admitting: Internal Medicine

## 2020-04-21 ENCOUNTER — Other Ambulatory Visit: Payer: Self-pay | Admitting: Internal Medicine

## 2020-05-24 ENCOUNTER — Other Ambulatory Visit: Payer: Self-pay

## 2020-05-24 ENCOUNTER — Ambulatory Visit (INDEPENDENT_AMBULATORY_CARE_PROVIDER_SITE_OTHER): Payer: Medicare Other | Admitting: Emergency Medicine

## 2020-05-24 DIAGNOSIS — I495 Sick sinus syndrome: Secondary | ICD-10-CM

## 2020-05-24 DIAGNOSIS — Z950 Presence of cardiac pacemaker: Secondary | ICD-10-CM | POA: Diagnosis not present

## 2020-05-24 DIAGNOSIS — I480 Paroxysmal atrial fibrillation: Secondary | ICD-10-CM

## 2020-05-24 LAB — CUP PACEART INCLINIC DEVICE CHECK
Battery Impedance: 6600 Ohm
Battery Voltage: 2.71 V
Brady Statistic RA Percent Paced: 82 %
Date Time Interrogation Session: 20210909170358
Implantable Lead Implant Date: 20090617
Implantable Lead Implant Date: 20090617
Implantable Lead Location: 753859
Implantable Lead Location: 753860
Implantable Pulse Generator Implant Date: 20090617
Lead Channel Impedance Value: 437 Ohm
Lead Channel Pacing Threshold Amplitude: 0.75 V
Lead Channel Pacing Threshold Pulse Width: 0.4 ms
Lead Channel Sensing Intrinsic Amplitude: 5 mV
Lead Channel Setting Pacing Amplitude: 2 V
Pulse Gen Model: 5826
Pulse Gen Serial Number: 1237757

## 2020-05-24 NOTE — Progress Notes (Signed)
Pacemaker check in clinic. Normal device function. Thresholds, sensing, impedances consistent with previous measurements. Device programmed to maximize longevity. 58 mode switches, longest 3 min, on Eliquis. No episode triggers enabled. Device programmed at appropriate safety margins. Histogram distribution appropriate for patient activity level. Device programmed to optimize intrinsic conduction. Estimated longevity 1.5-2 years. Patient's device is not remote-capable. Patient education completed. ROV with Dr. Caryl Comes on 09/05/20.

## 2020-07-27 ENCOUNTER — Telehealth: Payer: Self-pay | Admitting: Internal Medicine

## 2020-07-27 MED ORDER — VERAPAMIL HCL ER 120 MG PO TBCR: 120.0000 mg | EXTENDED_RELEASE_TABLET | Freq: Two times a day (BID) | ORAL | 0 refills | Status: DC

## 2020-07-27 NOTE — Telephone Encounter (Signed)
Pt called in stated she would like to speak with Rosann Auerbach .  They have been speaking on Mychart and she stated it would easier just to speak with her   Best number -[phone]

## 2020-07-27 NOTE — Telephone Encounter (Signed)
Spoke with pt and advised pt's Verapamil 120mg  CR tablets have been sent to [facility] as requested.  RX to [employer] has been canceled.  Pt verbalized understanding and thanked Therapist, sports for call.

## 2020-07-27 NOTE — Telephone Encounter (Signed)
Attempted phone call to pt.  Left voicemail message to contact RN at [phone]. 

## 2020-07-30 ENCOUNTER — Other Ambulatory Visit: Payer: Self-pay | Admitting: Internal Medicine

## 2020-08-08 ENCOUNTER — Other Ambulatory Visit: Payer: Self-pay | Admitting: Internal Medicine

## 2020-08-13 NOTE — Telephone Encounter (Signed)
Pt last saw Dr Caryl Comes 08/02/19, pt is overdue for follow-up. Pt has follow-up with Dr Caryl Comes scheduled for 09/05/20.  Last labs 12/28/19 Creat 0.85 at [facility] per care everywhere.  Age 79, weight 51.7kg, based on specified criteria pt is on appropriate dosage of Eliquis 5mg  BID.  Will refill rx x one 90 day refill to get pt to upcoming appt with Dr Caryl Comes.

## 2020-08-27 ENCOUNTER — Telehealth: Payer: Self-pay | Admitting: Internal Medicine

## 2020-08-27 MED ORDER — APIXABAN 5 MG PO TABS: 5.0000 mg | ORAL_TABLET | Freq: Two times a day (BID) | ORAL | 0 refills | Status: DC

## 2020-08-27 NOTE — Telephone Encounter (Signed)
Patient requests 5 tablets be sent to [facility]. I advised that they may have to call insurance if its too soon to fill. States she will pay cash. Will be home tomorrow. Rx sent

## 2020-08-27 NOTE — Telephone Encounter (Signed)
*  STAT* If patient is at the pharmacy, call can be transferred to refill team.   1. Which medications need to be refilled? (please list name of each medication and dose if known)   apixaban (ELIQUIS) 5 MG TABS tablet     2. Which pharmacy/location (including street and city if local pharmacy) is medication to be sent to? [facility] [place] [place] [facility]. Do they need a 30 day or 90 day supply? Patient is requesting a short supply because she is out of town and needs medication.  Please call patient to verify medication was sent to pharmacy.

## 2020-09-05 ENCOUNTER — Other Ambulatory Visit: Payer: Self-pay

## 2020-09-05 ENCOUNTER — Ambulatory Visit: Payer: Medicare Other | Admitting: Internal Medicine

## 2020-09-05 ENCOUNTER — Encounter: Payer: Self-pay | Admitting: Internal Medicine

## 2020-09-05 VITALS — BP 118/60 | HR 70 | Ht 60.0 in | Wt 119.4 lb

## 2020-09-05 DIAGNOSIS — I480 Paroxysmal atrial fibrillation: Secondary | ICD-10-CM

## 2020-09-05 DIAGNOSIS — I495 Sick sinus syndrome: Secondary | ICD-10-CM

## 2020-09-05 DIAGNOSIS — I421 Obstructive hypertrophic cardiomyopathy: Secondary | ICD-10-CM

## 2020-09-05 DIAGNOSIS — Z950 Presence of cardiac pacemaker: Secondary | ICD-10-CM | POA: Diagnosis not present

## 2020-09-05 LAB — CUP PACEART INCLINIC DEVICE CHECK
Battery Impedance: 12900 Ohm
Battery Remaining Longevity: 9 mo
Battery Voltage: 2.67 V
Brady Statistic RA Percent Paced: 91 %
Date Time Interrogation Session: 20211222115400
Implantable Lead Implant Date: 20090617
Implantable Lead Implant Date: 20090617
Implantable Lead Location: 753859
Implantable Lead Location: 753860
Implantable Pulse Generator Implant Date: 20090617
Lead Channel Impedance Value: 439 Ohm
Lead Channel Pacing Threshold Amplitude: 0.75 V
Lead Channel Pacing Threshold Pulse Width: 0.4 ms
Lead Channel Setting Pacing Amplitude: 2 V
Pulse Gen Model: 5826
Pulse Gen Serial Number: 1237757

## 2020-09-05 NOTE — Progress Notes (Signed)
Patient Care Team: Tomasa Hose, NP as PCP - General (Nurse Practitioner)   HPI  [name] is a 79 y.o. female Seen in follow-up for palpitations. 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 2016 reprogrammed her AAIR.  Infrequent atrial fibrillation.  No bleeding.  Denies chest pain or shortness of breath or edema   .  Date Cr  K Mg  6/18 0.78 4.0 2.0  11/20  0.98 4.4    DATE TEST EF   10/15 Echo  55-60 % Mod AI PHT 390  10/17 Echo  55-60 % Mod AI  PHT 454  10/20 Echo  55-65% Mild-Mod AI PHT 398      Past Medical History:  Diagnosis Date  . Asymmetric septal hypertrophy (HCC)   . Atrial fibrillation ([place])    with permanent pacemaker  . Blepharitis 10/2016   Dr.Groat  . Current use of long term anticoagulation   . Endometriosis   . Glaucoma   . H/O mitral valve repair   . Headache   . 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 IBS (irritable bowel syndrome) 12/2002  . Irregular heart beat   . 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 Rib fracture 01/2017   Dr.Green  . Severe mitral regurgitation     Past Surgical History:  Procedure Laterality Date  . APPENDECTOMY  1952  . HYSTEROSCOPY  06/2002   Polyp; Small fibroid  . HYSTEROSCOPY  09/10/09   Myoma  . MITRAL VALVE REPAIR      (plication of posterior leaflet with 28-mm Medtronic CG Future  Band annuloplasty). (02/23/08  Dr. Roxy Manns)  . MYOMECTOMY     median sternotomy  . PACEMAKER INSERTION  02/2008  . TONSILLECTOMY AND ADENOIDECTOMY  1955  . TUBAL LIGATION  1983    Current Outpatient Medications  Medication Sig Dispense Refill  . acetaminophen (TYLENOL) 650 MG CR tablet Take 650 mg by mouth. Taking 3  tablets daily.    Marland Kitchen apixaban (ELIQUIS) 5 MG TABS tablet Take 1 tablet (5 mg total) by mouth 2 (two) times daily. 5 tablet 0  . Cholecalciferol (VITAMIN D HIGH POTENCY PO) Take 10,000 Int'l Units by mouth once a week.     . COLLAGEN PO Take 1 Scoop by mouth daily.    . cycloSPORINE (RESTASIS) 0.05 % ophthalmic emulsion Place 1 drop into both eyes 2 (two) times daily.    . diclofenac Sodium (VOLTAREN) 1 % GEL Apply 1 application topically 2 (two) times daily.    . diphenhydrAMINE (BENADRYL) 25 mg capsule Take 50 mg by mouth at bedtime.     . dorzolamide-timolol (COSOPT) 22.3-6.8 MG/ML ophthalmic solution Place 1 drop into both eyes 2 (two) times daily.    . Eyelid Cleansers (AVENOVA) 0.01 % SOLN daily.    Marland Kitchen latanoprost (XALATAN) 0.005 % ophthalmic solution Place 1 drop into both eyes at bedtime.    Marland Kitchen loperamide (IMODIUM A-D) 2 MG tablet Take 2 mg by mouth 4 (four) times daily as needed for diarrhea or loose stools.    Marland Kitchen MAGNESIUM CL-CALCIUM CARBONATE PO Take 1 tablet by mouth 2 (two) times daily.    . Omega-3 Fatty Acids (FISH OIL) 1000 MG CAPS Take 2  capsules by mouth once a week.     . psyllium (METAMUCIL) 58.6 % powder Take 1 packet by mouth daily.    . valACYclovir (VALTREX) 500 MG tablet Take 500 mg by mouth every other day.     . verapamil (CALAN-SR) 120 MG CR tablet Take 1 tablet (120 mg total) by mouth 2 (two) times daily. 180 tablet 0   No current facility-administered medications for this visit.    Allergies  Allergen Reactions  . Amiodarone     Severe rash and upset stomach  . Doxycycline     Unknown - pt had other issues going on at the time, unsure if this medication had anything to do with how she felt    Review of Systems negative except from HPI and PMH  Physical Exam BP 118/60   Pulse 70   Ht 5' (1.524 m)   Wt 119 lb 6.4 oz (54.2 kg)   SpO2 97%   BMI 23.32 kg/m  Well developed and well nourished in no acute distress HENT normal Neck supple with  JVP-flat Clear Device pocket well healed; without hematoma or erythema.  There is no tethering  Regular rate and rhythm, no * murmur Abd-soft with active BS No Clubbing cyanosis   edema Skin-warm and dry A & Oriented  Grossly normal sensory and motor function  ECG  Apace 70 21/13/48 *    Assessment and  Plan  Hypertrophic cardiomyopathy status post myectomy  Family  issues have been addressed  Aortic insufficiency-moderate with normal left ventricular size  Paroxysmal atrial fibrillation   Pacemaker-St. Jude  PVCs and PACs  Doing well On Anticoagulation;  No bleeding issues  Scant afib  Device approaching ERI Will see in 3 months

## 2020-09-05 NOTE — Patient Instructions (Signed)
Medication Instructions:  Your physician recommends that you continue on your current medications as directed. Please refer to the Current Medication list given to you today.  *If you need a refill on your cardiac medications before your next appointment, please call your pharmacy*   Lab Work: None ordered.  If you have labs (blood work) drawn today and your tests are completely normal, you will receive your results only by: Marland Kitchen MyChart Message (if you have MyChart) OR . A paper copy in the mail If you have any lab test that is abnormal or we need to change your treatment, we will call you to review the results.   Testing/Procedures: None ordered.    Follow-Up: At [facility], you and your health needs are our priority.  As part of our continuing mission to provide you with exceptional heart care, we have created designated Provider Care Teams.  These Care Teams include your primary Cardiologist (physician) and Advanced Practice Providers (APPs -  Physician Assistants and Nurse Practitioners) who all work together to provide you with the care you need, when you need it.  We recommend signing up for the patient portal called "MyChart".  Sign up information is provided on this After Visit Summary.  MyChart is used to connect with patients for Virtual Visits (Telemedicine).  Patients are able to view lab/test results, encounter notes, upcoming appointments, etc.  Non-urgent messages can be sent to your provider as well.   To learn more about what you can do with MyChart, go to [email or web address].    Your next appointment:   Follow up with device clinic as scheduled.

## 2020-09-26 ENCOUNTER — Ambulatory Visit: Payer: Medicare Other | Admitting: Dermatology

## 2020-09-26 ENCOUNTER — Encounter: Payer: Self-pay | Admitting: Dermatology

## 2020-09-26 ENCOUNTER — Other Ambulatory Visit: Payer: Self-pay

## 2020-09-26 ENCOUNTER — Other Ambulatory Visit: Payer: Self-pay | Admitting: Dermatology

## 2020-09-26 DIAGNOSIS — D485 Neoplasm of uncertain behavior of skin: Secondary | ICD-10-CM

## 2020-09-26 DIAGNOSIS — Z1283 Encounter for screening for malignant neoplasm of skin: Secondary | ICD-10-CM | POA: Diagnosis not present

## 2020-09-26 DIAGNOSIS — Z85820 Personal history of malignant melanoma of skin: Secondary | ICD-10-CM | POA: Diagnosis not present

## 2020-09-26 NOTE — Patient Instructions (Signed)

## 2020-10-01 ENCOUNTER — Encounter: Payer: Self-pay | Admitting: Dermatology

## 2020-10-01 NOTE — Progress Notes (Signed)
   Follow-Up Visit   Subjective  [name] is a 80 y.o. female who presents for the following: Skin Problem (Back x months- no itch no bleed- crusty).  Growth Location: Back Duration: Several months Quality: Larger Associated Signs/Symptoms: Modifying Factors:  Severity:  Timing: Context: History of melanoma  Objective  Well appearing patient in no apparent distress; mood and affect are within normal limits. Objective  Right Thigh - Anterior: Right thigh- white scar-no sign repigmentation, no regional adenopathy.  Objective  Mid Back: 66mm textured brown papule with pink crust at superior border; rule out collision of SK plus CIS.     Objective  Mid Back: Head, neck, back, chest, abdomen, arms, legs examined.  No atypical moles or melanoma.    All sun exposed areas plus back examined.  Plus abdomen, arms, legs.   Assessment & Plan    Personal history of malignant melanoma of skin Right Thigh - Anterior  Yearly skin check  Neoplasm of uncertain behavior of skin Mid Back  Skin / nail biopsy Type of biopsy: tangential   Informed consent: discussed and consent obtained   Timeout: patient name, date of birth, surgical site, and procedure verified   Procedure prep:  Patient was prepped and draped in usual sterile fashion (Non sterile) Prep type:  Chlorhexidine Anesthesia: the lesion was anesthetized in a standard fashion   Anesthetic:  1% lidocaine w/ epinephrine 1-100,000 local infiltration Instrument used: flexible razor blade   Outcome: patient tolerated procedure well   Post-procedure details: wound care instructions given    Specimen 2 - Surgical pathology Differential Diagnosis: SEB K   Check Margins: No  Encounter for screening for malignant neoplasm of skin Mid Back  Annual skin examination.      I, [name], MD, have reviewed all documentation for this visit.  The documentation on 10/01/20 for the exam, diagnosis, procedures, and  orders are all accurate and complete.

## 2020-10-26 ENCOUNTER — Other Ambulatory Visit: Payer: Self-pay | Admitting: Internal Medicine

## 2020-10-26 MED ORDER — VERAPAMIL HCL ER 120 MG PO TBCR: 120.0000 mg | EXTENDED_RELEASE_TABLET | Freq: Two times a day (BID) | ORAL | 3 refills | Status: DC

## 2020-10-31 ENCOUNTER — Other Ambulatory Visit: Payer: Self-pay | Admitting: Internal Medicine

## 2020-10-31 NOTE — Telephone Encounter (Signed)
Eliquis mg refill request received. Patient is 80 years old, weight-54.2kg, Crea-0.85 on 12/28/2019 via [place] at [place], [place], and last seen by Dr. Caryl Comes on 09/05/2020. Dose is appropriate based on dosing criteria. Will send in refill to requested pharmacy.

## 2020-12-11 ENCOUNTER — Other Ambulatory Visit: Payer: Self-pay

## 2020-12-11 ENCOUNTER — Ambulatory Visit (INDEPENDENT_AMBULATORY_CARE_PROVIDER_SITE_OTHER): Payer: Medicare Other | Admitting: Emergency Medicine

## 2020-12-11 DIAGNOSIS — I495 Sick sinus syndrome: Secondary | ICD-10-CM

## 2020-12-11 NOTE — Progress Notes (Signed)
Pacemaker check in clinic. Normal device function. Thresholds, sensing, impedances consistent with previous measurements. Device has reached ERI at 2.5V. Currently rebounded to 2.64. Episode triggers off secondary to ERI. Pacing rate decreased by 10% per ERI programming. Device programmed at appropriate safety margins. Histogram distribution appropriate for patient activity level. Device programmed to optimize intrinsic conduction. Scheduling notified for OV with Dr. Caryl Comes to discuss gen change. Patient education completed.

## 2020-12-13 ENCOUNTER — Telehealth (INDEPENDENT_AMBULATORY_CARE_PROVIDER_SITE_OTHER): Payer: Medicare Other | Admitting: Internal Medicine

## 2020-12-13 ENCOUNTER — Telehealth: Payer: Self-pay

## 2020-12-13 ENCOUNTER — Other Ambulatory Visit: Payer: Self-pay

## 2020-12-13 VITALS — Ht 59.0 in | Wt 114.0 lb

## 2020-12-13 DIAGNOSIS — Z950 Presence of cardiac pacemaker: Secondary | ICD-10-CM | POA: Diagnosis not present

## 2020-12-13 NOTE — H&P (View-Only) (Signed)
Electrophysiology TeleHealth Note   Due to national recommendations of social distancing due to COVID 19, an audio/video telehealth visit is felt to be most appropriate for this patient at this time.  See MyChart message from today for the patient's consent to telehealth for [name].   Date:  12/13/2020   ID:  [name], DOB June 20, 1941, MRN [id number]  Location: patient's home  Provider location: [address], [place]  Evaluation Performed: Follow-up visit  PCP:  Jennifer Hose, NP  Cardiologist:     Electrophysiologist:  SK   Chief Complaint:  Sinus node dysfunction  History of Present Illness:    [name] is a 80 y.o. female who presents via audio/video conferencing for a telehealth visit today.  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    Past Medical History:  Diagnosis Date  . Asymmetric septal hypertrophy (HCC)   . Atrial fibrillation ([place])    with permanent pacemaker  . Blepharitis 10/2016   [name]  . Current use of long term anticoagulation   . Endometriosis   . Glaucoma   . H/O mitral valve repair   . Headache   . 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 IBS (irritable bowel syndrome) 12/2002  . Irregular heart beat   .  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 Rib fracture 01/2017   [name]  . Severe mitral regurgitation     Past Surgical History:  Procedure Laterality Date  . APPENDECTOMY  1952  . HYSTEROSCOPY  06/2002   Polyp; Small fibroid  . HYSTEROSCOPY  09/10/09   Myoma  . MITRAL VALVE REPAIR      (plication of posterior leaflet with 28-mm Medtronic CG Future  Band annuloplasty). (02/23/08  Dr. Roxy Manns)  . MYOMECTOMY     median sternotomy  . PACEMAKER INSERTION  02/2008  . TONSILLECTOMY AND ADENOIDECTOMY  1955  . TUBAL LIGATION  1983    Current Outpatient Medications  Medication Sig Dispense Refill  . acetaminophen (TYLENOL) 650 MG CR tablet Take 650 mg by mouth. Taking 3 tablets daily.    . Cholecalciferol (VITAMIN D HIGH POTENCY PO) Take 10,000 Int'l Units by mouth once a week.     . cycloSPORINE (RESTASIS) 0.05 % ophthalmic emulsion Place 1 drop into both eyes 2 (two) times daily.    . diclofenac Sodium (VOLTAREN) 1 % GEL Apply 1 application topically 2 (two) times daily.    . diphenhydrAMINE (BENADRYL) 25 mg capsule Take 50 mg by mouth at bedtime.     . dorzolamide-timolol (COSOPT) 22.3-6.8 MG/ML ophthalmic solution Place 1 drop into both eyes 2 (two) times  daily.    Marland Kitchen ELIQUIS 5 MG TABS tablet TAKE 1 TABLET BY MOUTH  TWICE DAILY 180 tablet 1  . Eyelid Cleansers (AVENOVA) 0.01 % SOLN daily.    Marland Kitchen latanoprost (XALATAN) 0.005 % ophthalmic solution Place 1 drop into both eyes at bedtime.    Marland Kitchen loperamide (IMODIUM A-D) 2 MG tablet Take 2 mg by mouth 4 (four) times daily as needed for diarrhea or loose stools.    Marland Kitchen MAGNESIUM CL-CALCIUM CARBONATE PO Take 1 tablet by mouth 2 (two) times daily.    . Omega-3 Fatty Acids (FISH OIL) 1000 MG CAPS Take 2 capsules by mouth once a week.     . valACYclovir (VALTREX) 500 MG tablet Take 500 mg by mouth every other day.     . verapamil (CALAN-SR) 120 MG CR tablet Take 1 tablet (120 mg total) by mouth 2 (two)  times daily. 180 tablet 3  . COLLAGEN PO Take 1 Scoop by mouth daily. (Patient not taking: Reported on 12/13/2020)    . psyllium (METAMUCIL) 58.6 % powder Take 1 packet by mouth daily. (Patient not taking: Reported on 12/13/2020)     No current facility-administered medications for this visit.    Allergies:   Amiodarone and Doxycycline   Social History:  The patient  reports that she has quit smoking. She has never used smokeless tobacco. She reports current alcohol use. She reports current drug use. Drug: Marijuana.   Family History:  The patient's   family history includes Breast cancer (age of onset: 41) in her maternal grandmother; Breast cancer (age of onset: 24) in her mother; Coronary artery disease in her brother and brother; Heart attack in her father; Heart disease in her mother.   ROS:  Please see the history of present illness.   All other systems are personally reviewed and negative.    Exam:    Vital Signs:  Ht 4\' 11"  (1.499 m)   Wt 114 lb (51.7 kg)   BMI 23.03 kg/m     Well appearing, alert and conversant, regular work of breathing,  good skin color Eyes- anicteric, neuro- grossly intact, skin- no apparent rash or lesions or cyanosis, mouth- oral mucosa is pink   Labs/Other Tests and Data Reviewed:    Recent Labs: No results found for requested labs within last 8760 hours.   Wt Readings from Last 3 Encounters:  12/13/20 114 lb (51.7 kg)  09/05/20 119 lb 6.4 oz (54.2 kg)  08/02/19 114 lb (51.7 kg)     Other studies personally reviewed: Additional studies/ records that were reviewed today include:   Last device remote is reviewed from [facility] PDF dated 12/11/20 which reveals normal device function,   arrhythmias -device reached ERI 10/29/20   ASSESSMENT & PLAN:   Hypertrophic cardiomyopathy status post myectomy  Family  issues have been addressed  Aortic insufficiency-moderate with normal left ventricular size  Paroxysmal atrial fibrillation    Pacemaker-St. Jude  Device at ERI   PVCs and PACs     Functional capacity loss with reversion to AAI  Will undertake device generator replacement and have reviewed the potential risks associated specifically lead fracture and infection she understands and is willing to proceed.  We have looked at the schedule with tentatively scheduled for 4/6  She also hopes that we can do something to improve exercise tolerance particularly walking up hills.  At this juncture about 2.4% of the time she is able to reach a heart rate of 90-100 but she may need  more than that to get up at home.     COVID 19 screen The patient denies symptoms of COVID 19 at this time.  The importance of social distancing was discussed today.  Follow-up:  To follow wound check  Next remote:   Current medicines are reviewed at length with the patient today.   The patient  concerns regarding her medicines.  The following changes were made today:    Labs/ tests ordered today include:  No orders of the defined types were placed in this encounter.   Future tests ( post COVID )     Patient Risk:  after full review of this patients clinical status, I feel that they are at moderate risk at this time.  Today, I have spent 15 minutes with the patient with telehealth technology discussing the above.  Signed, [name], MD  12/13/2020 2:50 PM     [place] [address] [phone] (office) [phone] (fax)

## 2020-12-13 NOTE — Telephone Encounter (Signed)
  Patient Consent for Virtual Visit         [name] has provided verbal consent on 12/13/2020 for a virtual visit (video or telephone).   CONSENT FOR VIRTUAL VISIT FOR:  [name]  By participating in this virtual visit I agree to the following:  I hereby voluntarily request, consent and authorize [facility] and its employed or contracted physicians, physician assistants, nurse practitioners or other licensed health care professionals (the Practitioner), to provide me with telemedicine health care services (the "Services") as deemed necessary by the treating Practitioner. I acknowledge and consent to receive the Services by the Practitioner via telemedicine. I understand that the telemedicine visit will involve communicating with the Practitioner through live audiovisual communication technology and the disclosure of certain medical information by electronic transmission. I acknowledge that I have been given the opportunity to request an in-person assessment or other available alternative prior to the telemedicine visit and am voluntarily participating in the telemedicine visit.  I understand that I have the right to withhold or withdraw my consent to the use of telemedicine in the course of my care at any time, without affecting my right to future care or treatment, and that the Practitioner or I may terminate the telemedicine visit at any time. I understand that I have the right to inspect all information obtained and/or recorded in the course of the telemedicine visit and may receive copies of available information for a reasonable fee.  I understand that some of the potential risks of receiving the Services via telemedicine include:  Marland Kitchen Delay or interruption in medical evaluation due to technological equipment failure or disruption; . Information transmitted may not be sufficient (e.g. poor resolution of images) to allow for appropriate medical decision making by the Practitioner;  and/or  . In rare instances, security protocols could fail, causing a breach of personal health information.  Furthermore, I acknowledge that it is my responsibility to provide information about my medical history, conditions and care that is complete and accurate to the best of my ability. I acknowledge that Practitioner's advice, recommendations, and/or decision may be based on factors not within their control, such as incomplete or inaccurate data provided by me or distortions of diagnostic images or specimens that may result from electronic transmissions. I understand that the practice of medicine is not an exact science and that Practitioner makes no warranties or guarantees regarding treatment outcomes. I acknowledge that a copy of this consent can be made available to me via my patient portal ([facility]), or I can request a printed copy by calling the office of [facility].    I understand that my insurance will be billed for this visit.   I have read or had this consent read to me. . I understand the contents of this consent, which adequately explains the benefits and risks of the Services being provided via telemedicine.  . I have been provided ample opportunity to ask questions regarding this consent and the Services and have had my questions answered to my satisfaction. . I give my informed consent for the services to be provided through the use of telemedicine in my medical care

## 2020-12-13 NOTE — Progress Notes (Signed)
Electrophysiology TeleHealth Note   Due to national recommendations of social distancing due to COVID 19, an audio/video telehealth visit is felt to be most appropriate for this patient at this time.  See MyChart message from today for the patient's consent to telehealth for [facility].   Date:  12/13/2020   ID:  [name], DOB [birth year], MRN [id number]  Location: patient's home  Provider location: [address] [place]  Evaluation Performed: Follow-up visit  PCP:  Tomasa Hose, NP  Cardiologist:     Electrophysiologist:  SK   Chief Complaint:  Sinus node dysfunction  History of Present Illness:    [name] is a 80 y.o. female who presents via audio/video conferencing for a telehealth visit today.  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    Past Medical History:  Diagnosis Date  . Asymmetric septal hypertrophy (HCC)   . Atrial fibrillation ([place])    with permanent pacemaker  . Blepharitis 10/2016   Dr.Groat  . Current use of long term anticoagulation   . Endometriosis   . Glaucoma   . H/O mitral valve repair   . Headache   . 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 IBS (irritable bowel syndrome) 12/2002  . Irregular heart beat   .  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 Rib fracture 01/2017   Dr.Green  . Severe mitral regurgitation     Past Surgical History:  Procedure Laterality Date  . APPENDECTOMY  1952  . HYSTEROSCOPY  06/2002   Polyp; Small fibroid  . HYSTEROSCOPY  09/10/09   Myoma  . MITRAL VALVE REPAIR      (plication of posterior leaflet with 28-mm Medtronic CG Future  Band annuloplasty). (02/23/08  Dr. Roxy Manns)  . MYOMECTOMY     median sternotomy  . PACEMAKER INSERTION  02/2008  . TONSILLECTOMY AND ADENOIDECTOMY  1955  . TUBAL LIGATION  1983    Current Outpatient Medications  Medication Sig Dispense Refill  . acetaminophen (TYLENOL) 650 MG CR tablet Take 650 mg by mouth. Taking 3 tablets daily.    . Cholecalciferol (VITAMIN D HIGH POTENCY PO) Take 10,000 Int'l Units by mouth once a week.     . cycloSPORINE (RESTASIS) 0.05 % ophthalmic emulsion Place 1 drop into both eyes 2 (two) times daily.    . diclofenac Sodium (VOLTAREN) 1 % GEL Apply 1 application topically 2 (two) times daily.    . diphenhydrAMINE (BENADRYL) 25 mg capsule Take 50 mg by mouth at bedtime.     . dorzolamide-timolol (COSOPT) 22.3-6.8 MG/ML ophthalmic solution Place 1 drop into both eyes 2 (two) times  daily.    Marland Kitchen ELIQUIS 5 MG TABS tablet TAKE 1 TABLET BY MOUTH  TWICE DAILY 180 tablet 1  . Eyelid Cleansers (AVENOVA) 0.01 % SOLN daily.    Marland Kitchen latanoprost (XALATAN) 0.005 % ophthalmic solution Place 1 drop into both eyes at bedtime.    Marland Kitchen loperamide (IMODIUM A-D) 2 MG tablet Take 2 mg by mouth 4 (four) times daily as needed for diarrhea or loose stools.    Marland Kitchen MAGNESIUM CL-CALCIUM CARBONATE PO Take 1 tablet by mouth 2 (two) times daily.    . Omega-3 Fatty Acids (FISH OIL) 1000 MG CAPS Take 2 capsules by mouth once a week.     . valACYclovir (VALTREX) 500 MG tablet Take 500 mg by mouth every other day.     . verapamil (CALAN-SR) 120 MG CR tablet Take 1 tablet (120 mg total) by mouth 2 (two)  times daily. 180 tablet 3  . COLLAGEN PO Take 1 Scoop by mouth daily. (Patient not taking: Reported on 12/13/2020)    . psyllium (METAMUCIL) 58.6 % powder Take 1 packet by mouth daily. (Patient not taking: Reported on 12/13/2020)     No current facility-administered medications for this visit.    Allergies:   Amiodarone and Doxycycline   Social History:  The patient  reports that she has quit smoking. She has never used smokeless tobacco. She reports current alcohol use. She reports current drug use. Drug: Marijuana.   Family History:  The patient's   family history includes Breast cancer (age of onset: 79) in her maternal grandmother; Breast cancer (age of onset: 80) in her mother; Coronary artery disease in her brother and brother; Heart attack in her father; Heart disease in her mother.   ROS:  Please see the history of present illness.   All other systems are personally reviewed and negative.    Exam:    Vital Signs:  Ht 4\' 11"  (1.499 m)   Wt 114 lb (51.7 kg)   BMI 23.03 kg/m     Well appearing, alert and conversant, regular work of breathing,  good skin color Eyes- anicteric, neuro- grossly intact, skin- no apparent rash or lesions or cyanosis, mouth- oral mucosa is pink   Labs/Other Tests and Data Reviewed:    Recent Labs: No results found for requested labs within last 8760 hours.   Wt Readings from Last 3 Encounters:  12/13/20 114 lb (51.7 kg)  09/05/20 119 lb 6.4 oz (54.2 kg)  08/02/19 114 lb (51.7 kg)     Other studies personally reviewed: Additional studies/ records that were reviewed today include:   Last device remote is reviewed from [facility] PDF dated 12/11/20 which reveals normal device function,   arrhythmias -device reached ERI 10/29/20   ASSESSMENT & PLAN:   Hypertrophic cardiomyopathy status post myectomy  Family  issues have been addressed  Aortic insufficiency-moderate with normal left ventricular size  Paroxysmal atrial fibrillation    Pacemaker-St. Jude  Device at ERI   PVCs and PACs     Functional capacity loss with reversion to AAI  Will undertake device generator replacement and have reviewed the potential risks associated specifically lead fracture and infection she understands and is willing to proceed.  We have looked at the schedule with tentatively scheduled for 4/6  She also hopes that we can do something to improve exercise tolerance particularly walking up hills.  At this juncture about 2.4% of the time she is able to reach a heart rate of 90-100 but she may need  more than that to get up at home.     COVID 19 screen The patient denies symptoms of COVID 19 at this time.  The importance of social distancing was discussed today.  Follow-up:  To follow wound check  Next remote:   Current medicines are reviewed at length with the patient today.   The patient  concerns regarding her medicines.  The following changes were made today:    Labs/ tests ordered today include:  No orders of the defined types were placed in this encounter.   Future tests ( post COVID )     Patient Risk:  after full review of this patients clinical status, I feel that they are at moderate risk at this time.  Today, I have spent 15 minutes with the patient with telehealth technology discussing the above.  Signed, [name], MD  12/13/2020 2:50 PM     [place] [address] [phone] (office) [phone] (fax)

## 2020-12-14 DIAGNOSIS — Z01812 Encounter for preprocedural laboratory examination: Secondary | ICD-10-CM

## 2020-12-14 DIAGNOSIS — Z950 Presence of cardiac pacemaker: Secondary | ICD-10-CM

## 2020-12-14 DIAGNOSIS — I495 Sick sinus syndrome: Secondary | ICD-10-CM

## 2020-12-14 NOTE — Telephone Encounter (Addendum)
Called pt to inform her it would be Monday before [name] would be in touch. Scheduled pt for pre procedure blood work & Covid screening Monday morning. Briefly reviewed procedure instructions with pt but aware [name] will be in touch to further confirm instructions. Aware [name] will give her Eliquis instructions when she calls. Patient verbalized understanding and agreeable to plan.

## 2020-12-16 NOTE — Patient Instructions (Signed)
Medication Instructions:  Your physician recommends that you continue on your current medications as directed. Please refer to the Current Medication list given to you today.  *If you need a refill on your cardiac medications before your next appointment, please call your pharmacy*   Lab Work: Pre procedure labs to be scheduled  If you have labs (blood work) drawn today and your tests are completely normal, you will receive your results only by: Marland Kitchen MyChart Message (if you have MyChart) OR . A paper copy in the mail If you have any lab test that is abnormal or we need to change your treatment, we will call you to review the results.   Testing/Procedures: Your physician has recommended that you have a pacemaker generator change. A pacemaker is a small device that is placed under the skin of your chest or abdomen to help control abnormal heart rhythms. This device uses electrical pulses to prompt the heart to beat at a normal rate. Pacemakers are used to treat heart rhythms that are too slow. Wire (leads) are attached to the pacemaker that goes into the chambers of you heart. This is done in the hospital and usually requires and overnight stay. Please see the instruction sheet given to you today for more information.    Follow-Up: At [facility], you and your health needs are our priority.  As part of our continuing mission to provide you with exceptional heart care, we have created designated Provider Care Teams.  These Care Teams include your primary Cardiologist (physician) and Advanced Practice Providers (APPs -  Physician Assistants and Nurse Practitioners) who all work together to provide you with the care you need, when you need it.  We recommend signing up for the patient portal called "MyChart".  Sign up information is provided on this After Visit Summary.  MyChart is used to connect with patients for Virtual Visits (Telemedicine).  Patients are able to view lab/test results, encounter  notes, upcoming appointments, etc.  Non-urgent messages can be sent to your provider as well.   To learn more about what you can do with MyChart, go to [email or web address].    Your next appointment:   To be scheduled

## 2020-12-17 ENCOUNTER — Other Ambulatory Visit: Payer: Self-pay

## 2020-12-17 ENCOUNTER — Other Ambulatory Visit: Payer: Medicare Other | Admitting: *Deleted

## 2020-12-17 ENCOUNTER — Other Ambulatory Visit (HOSPITAL_COMMUNITY)
Admission: RE | Admit: 2020-12-17 | Discharge: 2020-12-17 | Disposition: A | Discharge status: Home / Self Care | Payer: Medicare Other | Source: Ambulatory Visit | Attending: Internal Medicine | Admitting: Internal Medicine

## 2020-12-17 DIAGNOSIS — I495 Sick sinus syndrome: Secondary | ICD-10-CM

## 2020-12-17 DIAGNOSIS — Z950 Presence of cardiac pacemaker: Secondary | ICD-10-CM

## 2020-12-17 DIAGNOSIS — Z01812 Encounter for preprocedural laboratory examination: Secondary | ICD-10-CM | POA: Insufficient documentation

## 2020-12-17 DIAGNOSIS — Z20822 Contact with and (suspected) exposure to covid-19: Secondary | ICD-10-CM | POA: Diagnosis not present

## 2020-12-17 LAB — CUP PACEART INCLINIC DEVICE CHECK
Battery Impedance: 1000 Ohm
Battery Voltage: 2.64 V
Brady Statistic RA Percent Paced: 74 %
Date Time Interrogation Session: 20220329135657
Implantable Lead Implant Date: 20090617
Implantable Lead Implant Date: 20090617
Implantable Lead Location: 753859
Implantable Lead Location: 753860
Implantable Pulse Generator Implant Date: 20090617
Lead Channel Impedance Value: 425 Ohm
Lead Channel Impedance Value: 425 Ohm
Lead Channel Pacing Threshold Amplitude: 0.75 V
Lead Channel Pacing Threshold Pulse Width: 0.4 ms
Lead Channel Setting Pacing Amplitude: 2 V
Pulse Gen Model: 5826
Pulse Gen Serial Number: 1237757

## 2020-12-17 LAB — BASIC METABOLIC PANEL
BUN/Creatinine Ratio: 30 — ABNORMAL HIGH (ref 12–28)
BUN: 22 mg/dL (ref 8–27)
CO2: 24 mmol/L (ref 20–29)
Calcium: 9.7 mg/dL (ref 8.7–10.3)
Chloride: 104 mmol/L (ref 96–106)
Creatinine, Ser: 0.73 mg/dL (ref 0.57–1.00)
Glucose: 83 mg/dL (ref 65–99)
Potassium: 4.6 mmol/L (ref 3.5–5.2)
Sodium: 137 mmol/L (ref 134–144)
eGFR: 84 mL/min/{1.73_m2} (ref >59)

## 2020-12-17 LAB — CBC
Hematocrit: 37.6 % (ref 34.0–46.6)
Hemoglobin: 13.7 g/dL (ref 11.1–15.9)
MCH: 35 pg — ABNORMAL HIGH (ref 26.6–33.0)
MCHC: 36.4 g/dL — ABNORMAL HIGH (ref 31.5–35.7)
MCV: 96 fL (ref 79–97)
Platelets: 233 10*3/uL (ref 150–450)
RBC: 3.91 x10E6/uL (ref 3.77–5.28)
RDW: 13.2 % (ref 11.7–15.4)
WBC: 7.9 10*3/uL (ref 3.4–10.8)

## 2020-12-17 LAB — SARS CORONAVIRUS 2 (TAT 6-24 HRS): SARS Coronavirus 2: NEGATIVE

## 2020-12-19 ENCOUNTER — Other Ambulatory Visit: Payer: Self-pay

## 2020-12-19 ENCOUNTER — Ambulatory Visit (HOSPITAL_COMMUNITY)
Admission: RE | Admit: 2020-12-19 | Discharge: 2020-12-19 | Disposition: A | Discharge status: Home / Self Care | Payer: Medicare Other | Attending: Internal Medicine | Admitting: Internal Medicine

## 2020-12-19 ENCOUNTER — Ambulatory Visit (HOSPITAL_COMMUNITY)
Admission: RE | Disposition: A | Discharge status: Home / Self Care | Payer: Self-pay | Source: Home / Self Care | Attending: Internal Medicine

## 2020-12-19 DIAGNOSIS — F1290 Cannabis use, unspecified, uncomplicated: Secondary | ICD-10-CM | POA: Insufficient documentation

## 2020-12-19 DIAGNOSIS — I422 Other hypertrophic cardiomyopathy: Secondary | ICD-10-CM | POA: Insufficient documentation

## 2020-12-19 DIAGNOSIS — I495 Sick sinus syndrome: Secondary | ICD-10-CM | POA: Diagnosis not present

## 2020-12-19 DIAGNOSIS — Z7901 Long term (current) use of anticoagulants: Secondary | ICD-10-CM | POA: Insufficient documentation

## 2020-12-19 DIAGNOSIS — Z45010 Encounter for checking and testing of cardiac pacemaker pulse generator [battery]: Secondary | ICD-10-CM | POA: Diagnosis not present

## 2020-12-19 DIAGNOSIS — I351 Nonrheumatic aortic (valve) insufficiency: Secondary | ICD-10-CM | POA: Diagnosis not present

## 2020-12-19 DIAGNOSIS — Z87891 Personal history of nicotine dependence: Secondary | ICD-10-CM | POA: Insufficient documentation

## 2020-12-19 DIAGNOSIS — Z950 Presence of cardiac pacemaker: Secondary | ICD-10-CM

## 2020-12-19 DIAGNOSIS — Z881 Allergy status to other antibiotic agents status: Secondary | ICD-10-CM | POA: Insufficient documentation

## 2020-12-19 DIAGNOSIS — I480 Paroxysmal atrial fibrillation: Secondary | ICD-10-CM | POA: Diagnosis not present

## 2020-12-19 DIAGNOSIS — Z79899 Other long term (current) drug therapy: Secondary | ICD-10-CM | POA: Diagnosis not present

## 2020-12-19 HISTORY — PX: PPM GENERATOR CHANGEOUT: EP1233

## 2020-12-19 SURGERY — PPM GENERATOR CHANGEOUT

## 2020-12-19 MED ORDER — LIDOCAINE HCL (PF) 1 % IJ SOLN
INTRAMUSCULAR | Status: AC
  Filled 2020-12-19: qty 60

## 2020-12-19 MED ORDER — SODIUM CHLORIDE 0.9 % IV SOLN: INTRAVENOUS | Status: DC

## 2020-12-19 MED ORDER — CEFAZOLIN SODIUM-DEXTROSE 2-4 GM/100ML-% IV SOLN
2.0000 g | INTRAVENOUS | Status: AC
  Administered 2020-12-19: 2 g via INTRAVENOUS

## 2020-12-19 MED ORDER — SODIUM CHLORIDE 0.9 % IV SOLN
INTRAVENOUS | Status: AC
  Filled 2020-12-19: qty 2

## 2020-12-19 MED ORDER — CEFAZOLIN SODIUM-DEXTROSE 2-4 GM/100ML-% IV SOLN
INTRAVENOUS | Status: AC
  Filled 2020-12-19: qty 100

## 2020-12-19 MED ORDER — MIDAZOLAM HCL 5 MG/5ML IJ SOLN
INTRAMUSCULAR | Status: AC
  Filled 2020-12-19: qty 5

## 2020-12-19 MED ORDER — FENTANYL CITRATE (PF) 100 MCG/2ML IJ SOLN
INTRAMUSCULAR | Status: AC
  Filled 2020-12-19: qty 2

## 2020-12-19 MED ORDER — GENTAMICIN SULFATE 40 MG/ML IJ SOLN
80.0000 mg | INTRAMUSCULAR | Status: AC
  Administered 2020-12-19: 80 mg

## 2020-12-19 MED ORDER — ACETAMINOPHEN 325 MG PO TABS: 325.0000 mg | ORAL_TABLET | ORAL | Status: DC | PRN

## 2020-12-19 MED ORDER — FENTANYL CITRATE (PF) 100 MCG/2ML IJ SOLN
INTRAMUSCULAR | Status: DC | PRN
  Administered 2020-12-19: 25 ug via INTRAVENOUS

## 2020-12-19 MED ORDER — MIDAZOLAM HCL 5 MG/5ML IJ SOLN
INTRAMUSCULAR | Status: DC | PRN
  Administered 2020-12-19: 2 mg via INTRAVENOUS
  Administered 2020-12-19: 1 mg via INTRAVENOUS

## 2020-12-19 SURGICAL SUPPLY — 5 items
CABLE SURGICAL S-101-97-12 (CABLE) ×2 IMPLANT
HEMOSTAT SURGICEL 2X4 FIBR (HEMOSTASIS) ×1 IMPLANT
PACEMAKER ASSURITY DR-RF (Pacemaker) ×1 IMPLANT
PAD PRO RADIOLUCENT 2001M-C (PAD) ×2 IMPLANT
TRAY PACEMAKER INSERTION (PACKS) ×2 IMPLANT

## 2020-12-19 NOTE — Progress Notes (Addendum)
Discharge instructions reviewed with pt and her husband both voice understanding. [employer] Jude in to see pt

## 2020-12-19 NOTE — Discharge Instructions (Signed)
Implantable Cardiac Device Battery Change, Care After.   Dermabond will come off in next 10-14 days; if not will remove at wound check Keep wound dry until tomorrow evening No Driving x 4 days Wound check in office as scheduled    This sheet gives you information about how to care for yourself after your procedure. Your health care provider may also give you more specific instructions. If you have problems or questions, contact your health care provider. What can I expect after the procedure? After your procedure, it is common to have: Pain or soreness at the site where the cardiac device was inserted. Swelling at the site where the cardiac device was inserted. You should received an information card for your new device in 4-8 weeks. Follow these instructions at home: Incision care  Keep the incision clean and dry. Do not take baths, swim, or use a hot tub until after your wound check.  Do not shower for at least 7 days, or as directed by your health care provider. Pat the area dry with a clean towel. Do not rub the area. This may cause bleeding. Follow instructions from your health care provider about how to take care of your incision. Make sure you: Leave stitches (sutures), skin glue, or adhesive strips in place. These skin closures may need to stay in place for 2 weeks or longer. If adhesive strip edges start to loosen and curl up, you may trim the loose edges. Do not remove adhesive strips completely unless your health care provider tells you to do that. Check your incision area every day for signs of infection. Check for: More redness, swelling, or pain. More fluid or blood. Warmth. Pus or a bad smell. Activity Do not lift anything that is heavier than 10 lb (4.5 kg) until your health care provider says it is okay to do so. For the first week, or as long as told by your health care provider: Avoid lifting your affected arm higher than your shoulder. After 1 week, Be gentle when you  move your arms over your head. It is okay to raise your arm to comb your hair. Avoid strenuous exercise. Ask your health care provider when it is okay to: Resume your normal activities. Return to work or school. Resume sexual activity. Eating and drinking Eat a heart-healthy diet. This should include plenty of fresh fruits and vegetables, whole grains, low-fat dairy products, and lean protein like chicken and fish. Limit alcohol intake to no more than 1 drink a day for non-pregnant women and 2 drinks a day for men. One drink equals 12 oz of beer, 5 oz of wine, or 1 oz of hard liquor. Check ingredients and nutrition facts on packaged foods and beverages. Avoid the following types of food: Food that is high in salt (sodium). Food that is high in saturated fat, like full-fat dairy or red meat. Food that is high in trans fat, like fried food. Food and drinks that are high in sugar. Lifestyle Do not use any products that contain nicotine or tobacco, such as cigarettes and e-cigarettes. If you need help quitting, ask your health care provider. Take steps to manage and control your weight. Once cleared, get regular exercise. Aim for 150 minutes of moderate-intensity exercise (such as walking or yoga) or 75 minutes of vigorous exercise (such as running or swimming) each week. Manage other health problems, such as diabetes or high blood pressure. Ask your health care provider how you can manage these conditions. General instructions   Do not drive for 24 hours after your procedure if you were given a medicine to help you relax (sedative). Take over-the-counter and prescription medicines only as told by your health care provider. Avoid putting pressure on the area where the cardiac device was placed. If you need an MRI after your cardiac device has been placed, be sure to tell the health care provider who orders the MRI that you have a cardiac device. Avoid close and prolonged exposure to electrical  devices that have strong magnetic fields. These include: Cell phones. Avoid keeping them in a pocket near the cardiac device, and try using the ear opposite the cardiac device. MP3 players. Household appliances, like microwaves. Metal detectors. Electric generators. High-tension wires. Keep all follow-up visits as directed by your health care provider. This is important. Contact a health care provider if: You have pain at the incision site that is not relieved by over-the-counter or prescription medicines. You have any of these around your incision site or coming from it: More redness, swelling, or pain. Fluid or blood. Warmth to the touch. Pus or a bad smell. You have a fever. You feel brief, occasional palpitations, light-headedness, or any symptoms that you think might be related to your heart. Get help right away if: You experience chest pain that is different from the pain at the cardiac device site. You develop a red streak that extends above or below the incision site. You experience shortness of breath. You have palpitations or an irregular heartbeat. You have light-headedness that does not go away quickly. You faint or have dizzy spells. Your pulse suddenly drops or increases rapidly and does not return to normal. You begin to gain weight and your legs and ankles swell. Summary After your procedure, it is common to have pain, soreness, and some swelling where the cardiac device was inserted. Make sure to keep your incision clean and dry. Follow instructions from your health care provider about how to take care of your incision. Check your incision every day for signs of infection, such as more pain or swelling, pus or a bad smell, warmth, or leaking fluid and blood. Avoid strenuous exercise and lifting your left arm higher than your shoulder for 2 weeks, or as long as told by your health care provider. This information is not intended to replace advice given to you by your  health care provider. Make sure you discuss any questions you have with your health care provider. 

## 2020-12-19 NOTE — Interval H&P Note (Signed)
History and Physical Interval Note:  12/19/2020 9:48 AM  [name]  has presented today for surgery, with the diagnosis of chest pain.  The various methods of treatment have been discussed with the patient and family. After consideration of risks, benefits and other options for treatment, the patient has consented to  Procedure(s): PPM GENERATOR CHANGEOUT (N/A) as a surgical intervention.  The patient's history has been reviewed, patient examined, no change in status, stable for surgery.  I have reviewed the patient's chart and labs.  Questions were answered to the patient's satisfaction.     [name]

## 2020-12-20 ENCOUNTER — Encounter (HOSPITAL_COMMUNITY): Payer: Self-pay | Admitting: Internal Medicine

## 2020-12-20 ENCOUNTER — Telehealth: Payer: Self-pay

## 2020-12-20 MED FILL — Lidocaine HCl Local Preservative Free (PF) Inj 1%: INTRAMUSCULAR | Qty: 60 | Status: AC

## 2020-12-20 NOTE — Telephone Encounter (Signed)
I walked the patient through a transmission. Transmission received.

## 2020-12-20 NOTE — Telephone Encounter (Signed)
Patient advised she can shower 48 hours after the gen change. Education provided on how to use the remote monitor. Wound care education provided to patient. Patient states she will be out of town the week of her wound check and needs to reschedule. Patient is not able to come to wound check that was offered on 01/01/21 d/t schedule conflict. Wound check rescheduled on 01/03/21 @ 2:40. Patient is gen change only, no new leads. Patient advised to call with questions or concerns regarding wound issues if they should arise.

## 2020-12-21 ENCOUNTER — Other Ambulatory Visit: Payer: Self-pay

## 2020-12-21 ENCOUNTER — Ambulatory Visit (INDEPENDENT_AMBULATORY_CARE_PROVIDER_SITE_OTHER): Payer: Medicare Other | Admitting: Emergency Medicine

## 2020-12-21 ENCOUNTER — Telehealth: Payer: Self-pay | Admitting: Emergency Medicine

## 2020-12-21 DIAGNOSIS — I495 Sick sinus syndrome: Secondary | ICD-10-CM

## 2020-12-21 NOTE — Patient Instructions (Signed)
Instructions and Device number given to patient.

## 2020-12-21 NOTE — Progress Notes (Signed)
Patient's dressing was already removed prior to appointment. Patient has derma bond in place. Instruction sheet given to patient with Device Clinic number.

## 2020-12-21 NOTE — Telephone Encounter (Signed)
Patient called about wound check and dressing. Patient states that she can not get the occlusive dressing off due to the edge not easy to remove. Patient also states that she thinks that the wound is opening up underneath. Patient scheduled for a wound check appointment today at 11:30 am. Patient states that her husband will drive her to the [facility] office.

## 2020-12-28 ENCOUNTER — Other Ambulatory Visit: Payer: Self-pay | Admitting: Internal Medicine

## 2021-01-01 ENCOUNTER — Ambulatory Visit: Payer: Medicare Other

## 2021-01-10 ENCOUNTER — Other Ambulatory Visit: Payer: Self-pay

## 2021-01-10 ENCOUNTER — Ambulatory Visit (INDEPENDENT_AMBULATORY_CARE_PROVIDER_SITE_OTHER): Payer: Medicare Other | Admitting: Emergency Medicine

## 2021-01-10 DIAGNOSIS — I495 Sick sinus syndrome: Secondary | ICD-10-CM

## 2021-01-10 LAB — CUP PACEART INCLINIC DEVICE CHECK
Battery Remaining Longevity: 115 mo
Battery Voltage: 3.08 V
Brady Statistic RA Percent Paced: 94 %
Brady Statistic RV Percent Paced: 0 %
Date Time Interrogation Session: 20220428144000
Implantable Lead Implant Date: 20090617
Implantable Lead Implant Date: 20090617
Implantable Lead Location: 753859
Implantable Lead Location: 753860
Implantable Pulse Generator Implant Date: 20220406
Lead Channel Impedance Value: 412.5 Ohm
Lead Channel Impedance Value: 437.5 Ohm
Lead Channel Pacing Threshold Amplitude: 0.75 V
Lead Channel Pacing Threshold Pulse Width: 0.4 ms
Lead Channel Sensing Intrinsic Amplitude: 5 mV
Lead Channel Setting Pacing Amplitude: 2 V
Pulse Gen Model: 2272
Pulse Gen Serial Number: 3911946

## 2021-01-10 NOTE — Progress Notes (Signed)
Wound check appointment. Dermabond removed. Wound without redness or edema. Incision edges approximated, wound well healed. Normal device function. Thresholds, sensing, and impedance consistent with implant measurements. Histogram distribution appropriate for patient and level of activity. Patient programmed AAI. No AMS episodes. AT/AF burden 0.0%. + OAC Eliquis 5mg . Patient educated about wound care, arm mobility, lifting restrictions. Next remote scheduled on 03/20/21.

## 2021-02-25 ENCOUNTER — Telehealth: Payer: Self-pay

## 2021-02-25 NOTE — Telephone Encounter (Signed)
The patient let me know she will be out of town from 6/23-7/8. I rescheduled her transmission for 03-25-2021. The patient thanked me for my help.

## 2021-03-25 ENCOUNTER — Ambulatory Visit (INDEPENDENT_AMBULATORY_CARE_PROVIDER_SITE_OTHER): Payer: Medicare Other

## 2021-03-25 DIAGNOSIS — I495 Sick sinus syndrome: Secondary | ICD-10-CM | POA: Diagnosis not present

## 2021-03-25 LAB — CUP PACEART REMOTE DEVICE CHECK
Battery Remaining Longevity: 119 mo
Battery Remaining Percentage: 95.5 %
Battery Voltage: 3.02 V
Brady Statistic RA Percent Paced: 92 %
Date Time Interrogation Session: 20220708232635
Implantable Lead Implant Date: 20090617
Implantable Lead Implant Date: 20090617
Implantable Lead Location: 753859
Implantable Lead Location: 753860
Implantable Pulse Generator Implant Date: 20220406
Lead Channel Impedance Value: 410 Ohm
Lead Channel Pacing Threshold Amplitude: 0.75 V
Lead Channel Pacing Threshold Pulse Width: 0.4 ms
Lead Channel Sensing Intrinsic Amplitude: 5 mV
Lead Channel Setting Pacing Amplitude: 2 V
Pulse Gen Model: 2272
Pulse Gen Serial Number: 3911946

## 2021-04-04 ENCOUNTER — Encounter: Payer: Medicare Other | Admitting: Internal Medicine

## 2021-04-15 NOTE — Progress Notes (Signed)
Remote pacemaker transmission.   

## 2021-05-09 ENCOUNTER — Other Ambulatory Visit: Payer: Self-pay | Admitting: Internal Medicine

## 2021-05-09 NOTE — Telephone Encounter (Signed)
Eliquis '5mg'$  refill request received. Patient is 80 years old, weight-57.1kg, Crea-0.73 on 12/17/2020, Diagnosis-Afib, and last seen by Dr. Caryl Comes on 3/31/222 via Telemedicine. Dose is appropriate based on dosing criteria. Will send in refill to requested pharmacy.

## 2021-05-10 NOTE — Progress Notes (Signed)
Electrophysiology Office Note   Date:  05/14/2021   ID:  [name], [name] Jul 20, 1941, MRN [id number]  Location: patient's home  Provider location: [address], [place]  Evaluation Performed: Follow-up visit  PCP:  Tomasa Hose, NP  Cardiologist:     Electrophysiologist:  SK   Chief Complaint:  Sinus node dysfunction  History of Present Illness:    [name] is a 80 y.o. female who presents for follow-up for [facility] pacemaker implanted originally 2009 with generator change in 4/22.  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  Device is programmed AAIR     Today the patient denies chest pain, shortness of breath, nocturnal dyspnea, orthopnea or peripheral edema.  There have been no palpitations, lightheadedness or syncope.  Complains of some discomfort in her calves bilaterally provoked by walking relieved by rest.  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    Date Cr  K Mg  6/18 0.78 4.0 2.0  11/20  0.98 4.4    4/22 0.73 4.6   8/22 0.71 4.6     DATE TEST EF    10/15 Echo  55-60 % Mod AI PHT 390  10/17 Echo  55-60 % Mod AI  PHT 454  10/20 Echo  55-65% Mild-Mod AI PHT 398     Past Medical History:  Diagnosis Date   Asymmetric septal hypertrophy (HCC)    Atrial fibrillation (HCC)    with permanent pacemaker   Blepharitis 10/2016   Dr.Groat   Current use of long term anticoagulation    Endometriosis    Glaucoma    H/O mitral valve repair    Headache    HSV (herpes simplex virus) infection    Gluteal & near mouth   IBS (irritable bowel syndrome) 12/2002   Irregular heart beat    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, 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 (02/23/08  Dr. Roxy Manns)   MYOMECTOMY     median sternotomy   PACEMAKER INSERTION  02/2008   PPM GENERATOR CHANGEOUT N/A 12/19/2020   Procedure: PPM GENERATOR CHANGEOUT;  Surgeon: Deboraha Sprang, MD;  Location: [facility];  Service: Cardiovascular;  Laterality: N/A;   TONSILLECTOMY AND ADENOIDECTOMY  1955   TUBAL LIGATION  1983    Current Outpatient Medications  Medication Sig Dispense Refill   acetaminophen (TYLENOL) 650 MG CR tablet Take 1,300 mg by mouth 2 (two) times daily.     apixaban (ELIQUIS) 5 MG TABS tablet TAKE 1 TABLET BY MOUTH  TWICE DAILY 180 tablet 1   Cholecalciferol (VITAMIN D HIGH POTENCY PO) Take 10,000 Int'l Units by mouth once a week. Ultra     cycloSPORINE (RESTASIS) 0.05 % ophthalmic emulsion Place 1 drop into both eyes 2 (two) times daily.     diclofenac Sodium (VOLTAREN) 1 % GEL Apply 1 application topically 2 (two) times daily. Knees     diphenhydrAMINE (BENADRYL) 25 mg capsule Take  50 mg by mouth at bedtime.      dorzolamide-timolol (COSOPT) 22.3-6.8 MG/ML ophthalmic solution Place 1 drop into both eyes 2 (two) times daily.     Eyelid Cleansers (AVENOVA) 0.01 % SOLN Place 1 application into both eyes 2 (two) times daily.     fluticasone (FLONASE) 50 MCG/ACT nasal spray Place 1 spray into both nostrils at bedtime.     latanoprost (XALATAN) 0.005 % ophthalmic solution Place 1 drop into both eyes at bedtime.     loperamide (IMODIUM A-D) 2 MG tablet Take 2 mg by mouth 4 (four) times daily as needed for diarrhea or loose stools.     MAGNESIUM CL-CALCIUM CARBONATE PO Take 1 tablet by mouth 2 (two) times daily. 500 mg /1000 mg     Omega-3 Fatty Acids (FISH OIL) 1000 MG CAPS Take 2 capsules by mouth once a week. Pro Omega D     Probiotic  Product (PROBIOTIC DAILY PO) Take 1 tablet by mouth daily. ortho spore ig     rosuvastatin (CRESTOR) 10 MG tablet Take 10 mg by mouth 3 (three) times a week.     valACYclovir (VALTREX) 500 MG tablet Take 500 mg by mouth every other day.      verapamil (CALAN-SR) 120 MG CR tablet Take 1 tablet (120 mg total) by mouth 2 (two) times daily. 180 tablet 3   No current facility-administered medications for this visit.    Allergies:   Amiodarone and Doxycycline      Exam:    Vital Signs:  BP (!) 128/58   Pulse 70   Ht '4\' 11"'$  (1.499 m)   Wt 116 lb (52.6 kg)   SpO2 96%   BMI 23.43 kg/m     Well developed and well nourished in no acute distress HENT normal Neck supple with JVP-flat Clear Device pocket well healed; without hematoma or erythema.  There is no tethering  Regular rate and rhythm, no  gallop No / murmur Abd-soft with active BS No Clubbing cyanosis  edema Skin-warm and dry A & Oriented  Grossly normal sensory and motor function  ECG atrial pacing at 70 Intervals 17/13/44    Labs/Other Tests and Data Reviewed:    Recent Labs: 12/17/2020: BUN 22; Creatinine, Ser 0.73; Hemoglobin 13.7; Platelets 233; Potassium 4.6; Sodium 137   Wt Readings from Last 3 Encounters:  05/14/21 116 lb (52.6 kg)  12/19/20 114 lb (51.7 kg)  12/13/20 114 lb (51.7 kg)     Other studies personally reviewed: Additional studies/ records that were reviewed today include:   Last device remote is reviewed from [facility] PDF dated 12/11/20 which reveals normal device function,   arrhythmias -device reached ERI 10/29/20   ASSESSMENT & PLAN:   Hypertrophic cardiomyopathy status post myectomy  Family  issues have been addressed   Aortic insufficiency-moderate with normal left ventricular size   Paroxysmal atrial fibrillation    Pacemaker-[employer]  Device at ERI    PVCs and PACs    Claudication  Functional status stable.  No bleeding on the Eliquis.  She is approaching her 80th birthday; we  will refill a prescription of 2.5 mg twice daily.  Lengthy discussion regarding the benefits of Eliquis versus placebo, versus warfarin in the context of atrial fibrillation.  In the context of her hypertrophic cardiomyopathy, it is universally recommended that long-term anticoagulation is appropriate  Some claudication symptoms.  We will check lower extremity arterial Dopplers.  For now we will continue her on rosuvastatin 10 mg 3  times a week.  She just started new painting      Current medicines are reviewed at length with the patient today.   The patient  concerns regarding her medicines.  The following changes were made today:    Labs/ tests ordered today include:  No orders of the defined types were placed in this encounter.       Signed, [name], MD  05/14/2021 3:39 PM     [place] [address] [place] [address] [phone] (office) [phone] (fax)

## 2021-05-14 ENCOUNTER — Ambulatory Visit (INDEPENDENT_AMBULATORY_CARE_PROVIDER_SITE_OTHER): Payer: Medicare Other | Admitting: Internal Medicine

## 2021-05-14 ENCOUNTER — Encounter: Payer: Self-pay | Admitting: Internal Medicine

## 2021-05-14 ENCOUNTER — Other Ambulatory Visit: Payer: Self-pay

## 2021-05-14 VITALS — BP 128/58 | HR 70 | Ht 59.0 in | Wt 116.0 lb

## 2021-05-14 DIAGNOSIS — I480 Paroxysmal atrial fibrillation: Secondary | ICD-10-CM | POA: Diagnosis not present

## 2021-05-14 DIAGNOSIS — Z950 Presence of cardiac pacemaker: Secondary | ICD-10-CM

## 2021-05-14 DIAGNOSIS — I495 Sick sinus syndrome: Secondary | ICD-10-CM | POA: Diagnosis not present

## 2021-05-14 DIAGNOSIS — I739 Peripheral vascular disease, unspecified: Secondary | ICD-10-CM

## 2021-05-14 MED ORDER — APIXABAN 2.5 MG PO TABS: 2.5000 mg | ORAL_TABLET | Freq: Two times a day (BID) | ORAL | 3 refills | Status: DC

## 2021-05-14 NOTE — Patient Instructions (Signed)
Medication Instructions:   Your physician has recommended you make the following change in your medication:   Decrease your Eliquis to 2.'5mg'$  tablet, two times daily. Take with food.  You may finish your '5mg'$  doses before switching to your new lowered dose.   Labwork: None ordered.  Testing/Procedures:  Bilateral lower extremity Arterial Duplex Ultrasound  Follow-Up: Your physician recommends that you schedule a follow-up appointment in:   Tues May 30th, 2023 @ 1:45pm   Any Other Special Instructions Will Be Listed Below (If Applicable).     If you need a refill on your cardiac medications before your next appointment, please call your pharmacy.

## 2021-05-16 ENCOUNTER — Other Ambulatory Visit (HOSPITAL_COMMUNITY): Payer: Self-pay | Admitting: Internal Medicine

## 2021-05-16 DIAGNOSIS — I739 Peripheral vascular disease, unspecified: Secondary | ICD-10-CM

## 2021-05-22 ENCOUNTER — Other Ambulatory Visit: Payer: Self-pay

## 2021-05-22 ENCOUNTER — Ambulatory Visit (HOSPITAL_COMMUNITY)
Admission: RE | Admit: 2021-05-22 | Discharge: 2021-05-22 | Disposition: A | Discharge status: Home / Self Care | Payer: Medicare Other | Source: Ambulatory Visit | Attending: Internal Medicine | Admitting: Internal Medicine

## 2021-05-22 DIAGNOSIS — I739 Peripheral vascular disease, unspecified: Secondary | ICD-10-CM | POA: Insufficient documentation

## 2021-05-28 ENCOUNTER — Other Ambulatory Visit: Payer: Self-pay

## 2021-05-28 ENCOUNTER — Ambulatory Visit: Payer: Medicare Other | Admitting: Cardiovascular Disease

## 2021-05-28 ENCOUNTER — Encounter: Payer: Self-pay | Admitting: Cardiovascular Disease

## 2021-05-28 VITALS — BP 124/60 | HR 70 | Ht 60.0 in | Wt 117.4 lb

## 2021-05-28 DIAGNOSIS — E785 Hyperlipidemia, unspecified: Secondary | ICD-10-CM | POA: Diagnosis not present

## 2021-05-28 DIAGNOSIS — I480 Paroxysmal atrial fibrillation: Secondary | ICD-10-CM | POA: Diagnosis not present

## 2021-05-28 DIAGNOSIS — I739 Peripheral vascular disease, unspecified: Secondary | ICD-10-CM | POA: Diagnosis not present

## 2021-05-28 MED ORDER — ROSUVASTATIN CALCIUM 10 MG PO TABS: 10.0000 mg | ORAL_TABLET | Freq: Every day | ORAL | 1 refills | Status: DC

## 2021-05-28 NOTE — Patient Instructions (Addendum)
Medication Instructions:  INCREASE the Rosuvastatin to 10 mg once daily  *If you need a refill on your cardiac medications before your next appointment, please call your pharmacy*   Lab Work: Your provider would like for you to return in 3 months at your next appointment to have the following labs drawn: Fasting lipid and liver. You do not need an appointment for the lab. Once in our office lobby there is a podium where you can sign in and ring the doorbell to alert [place] that you are here. The lab is open from 8:00 am to 4:30 pm; closed for lunch from 12:45pm-1:45pm.  If you have labs (blood work) drawn today and your tests are completely normal, you will receive your results only by: [place] (if you have MyChart) OR A paper copy in the mail If you have any lab test that is abnormal or we need to change your treatment, we will call you to review the results.   Testing/Procedures: None ordered   Follow-Up: At [facility], you and your health needs are our priority.  As part of our continuing mission to provide you with exceptional heart care, we have created designated Provider Care Teams.  These Care Teams include your primary Cardiologist (physician) and Advanced Practice Providers (APPs -  Physician Assistants and Nurse Practitioners) who all work together to provide you with the care you need, when you need it.  We recommend signing up for the patient portal called "MyChart".  Sign up information is provided on this After Visit Summary.  MyChart is used to connect with patients for Virtual Visits (Telemedicine).  Patients are able to view lab/test results, encounter notes, upcoming appointments, etc.  Non-urgent messages can be sent to your provider as well.   To learn more about what you can do with MyChart, go to [email or web address].    Your next appointment:   3 month(s)  The format for your next appointment:   In Person  Provider:   Kathlyn Sacramento,  MD   Heart-Healthy Eating Plan Heart-healthy meal planning includes: Eating less unhealthy fats. Eating more healthy fats. Making other changes in your diet. Talk with your doctor or a diet specialist (dietitian) to create an eating plan that is right for you. What is my plan? Your doctor may recommend an eating plan that includes: Total fat: ______% or less of total calories a day. Saturated fat: ______% or less of total calories a day. Cholesterol: less than _________mg a day. What are tips for following this plan? Cooking Avoid frying your food. Try to bake, boil, grill, or broil it instead. You can also reduce fat by: Removing the skin from poultry. Removing all visible fats from meats. Steaming vegetables in water or broth. Meal planning  At meals, divide your plate into four equal parts: Fill one-half of your plate with vegetables and green salads. Fill one-fourth of your plate with whole grains. Fill one-fourth of your plate with lean protein foods. Eat 4-5 servings of vegetables per day. A serving of vegetables is: 1 cup of raw or cooked vegetables. 2 cups of raw leafy greens. Eat 4-5 servings of fruit per day. A serving of fruit is: 1 medium whole fruit.  cup of dried fruit.  cup of fresh, frozen, or canned fruit.  cup of 100% fruit juice. Eat more foods that have soluble fiber. These are apples, broccoli, carrots, beans, peas, and barley. Try to get 20-30 g of fiber per day. Eat 4-5 servings of nuts, legumes,  and seeds per week: 1 serving of dried beans or legumes equals  cup after being cooked. 1 serving of nuts is  cup. 1 serving of seeds equals 1 tablespoon. General information Eat more home-cooked food. Eat less restaurant, buffet, and fast food. Limit or avoid alcohol. Limit foods that are high in starch and sugar. Avoid fried foods. Lose weight if you are overweight. Keep track of how much salt (sodium) you eat. This is important if you have high  blood pressure. Ask your doctor to tell you more about this. Try to add vegetarian meals each week. Fats Choose healthy fats. These include olive oil and canola oil, flaxseeds, walnuts, almonds, and seeds. Eat more omega-3 fats. These include salmon, mackerel, sardines, tuna, flaxseed oil, and ground flaxseeds. Try to eat fish at least 2 times each week. Check food labels. Avoid foods with trans fats or high amounts of saturated fat. Limit saturated fats. These are often found in animal products, such as meats, butter, and cream. These are also found in plant foods, such as palm oil, palm kernel oil, and coconut oil. Avoid foods with partially hydrogenated oils in them. These have trans fats. Examples are stick margarine, some tub margarines, cookies, crackers, and other baked goods. What foods can I eat? Fruits All fresh, canned (in natural juice), or frozen fruits. Vegetables Fresh or frozen vegetables (raw, steamed, roasted, or grilled). Green salads. Grains Most grains. Choose whole wheat and whole grains most of the time. Rice and pasta, including brown rice and pastas made with whole wheat. Meats and other proteins Lean, well-trimmed beef, veal, pork, and lamb. Chicken and [place] without skin. All fish and shellfish. Wild duck, rabbit, pheasant, and venison. Egg whites or low-cholesterol egg substitutes. Dried beans, peas, lentils, and tofu. Seeds and most nuts. Dairy Low-fat or nonfat cheeses, including ricotta and mozzarella. Skim or 1% milk that is liquid, powdered, or evaporated. Buttermilk that is made with low-fat milk. Nonfat or low-fat yogurt. Fats and oils Non-hydrogenated (trans-free) margarines. Vegetable oils, including soybean, sesame, sunflower, olive, peanut, safflower, corn, canola, and cottonseed. Salad dressings or mayonnaise made with a vegetable oil. Beverages Mineral water. Coffee and tea. Diet carbonated beverages. Sweets and desserts Sherbet, gelatin, and fruit  ice. Small amounts of dark chocolate. Limit all sweets and desserts. Seasonings and condiments All seasonings and condiments. The items listed above may not be a complete list of foods and drinks you can eat. Contact a dietitian for more options. What foods should I avoid? Fruits Canned fruit in heavy syrup. Fruit in cream or butter sauce. Fried fruit. Limit coconut. Vegetables Vegetables cooked in cheese, cream, or butter sauce. Fried vegetables. Grains Breads that are made with saturated or trans fats, oils, or whole milk. Croissants. Sweet rolls. Donuts. High-fat crackers, such as cheese crackers. Meats and other proteins Fatty meats, such as hot dogs, ribs, sausage, bacon, rib-eye roast or steak. High-fat deli meats, such as salami and bologna. Caviar. Domestic duck and goose. Organ meats, such as liver. Dairy Cream, sour cream, cream cheese, and creamed cottage cheese. Whole-milk cheeses. Whole or 2% milk that is liquid, evaporated, or condensed. Whole buttermilk. Cream sauce or high-fat cheese sauce. Yogurt that is made from whole milk. Fats and oils Meat fat, or shortening. Cocoa butter, hydrogenated oils, palm oil, coconut oil, palm kernel oil. Solid fats and shortenings, including bacon fat, salt pork, lard, and butter. Nondairy cream substitutes. Salad dressings with cheese or sour cream. Beverages Regular sodas and juice drinks with added  sugar. Sweets and desserts Frosting. Pudding. Cookies. Cakes. Pies. Milk chocolate or white chocolate. Buttered syrups. Full-fat ice cream or ice cream drinks. The items listed above may not be a complete list of foods and drinks to avoid. Contact a dietitian for more information. Summary Heart-healthy meal planning includes eating less unhealthy fats, eating more healthy fats, and making other changes in your diet. Eat a balanced diet. This includes fruits and vegetables, low-fat or nonfat dairy, lean protein, nuts and legumes, whole grains, and  heart-healthy oils and fats. This information is not intended to replace advice given to you by your health care provider. Make sure you discuss any questions you have with your health care provider. Document Revised: 01/10/2021 Document Reviewed: 01/10/2021 Elsevier Patient Education  2022 [employer].

## 2021-05-28 NOTE — Progress Notes (Signed)
Cardiology Office Note   Date:  05/28/2021   ID:  [name], DOB 1941/08/11, MRN [id number]  PCP:  Tomasa Hose, NP  Cardiologist: Dr. Caryl Comes  No chief complaint on file.     History of Present Illness: [name] is a 80 y.o. female who was referred by Dr. Caryl Comes for evaluation management of peripheral arterial disease. She has known history of sinus node dysfunction status post pacemaker placement in 2009, hypertrophic cardiomyopathy status post mitral valve repair and septal myomectomy, paroxysmal atrial fibrillation and hyperlipidemia.  She is a lifelong non-smoker and is not diabetic. She reports bilateral calf claudication after walking for about 10 minutes.  This started about 1 year ago and has been relatively stable.  It is worse on the right than the left side.  No rest pain or lower extremity ulceration.  She underwent recent lower extremity arterial Doppler which showed an ABI of 0.83 on the right and 1.01 on the left.  Duplex showed severe right mid SFA stenosis with peak velocity of 451.  On the left, there was moderate SFA disease.  She denies chest pain, shortness of breath or palpitations.  She exercises almost daily as she attends an online aerobic class.  Past Medical History:  Diagnosis Date   Asymmetric septal hypertrophy (HCC)    Atrial fibrillation ([place])    with permanent pacemaker   Blepharitis 10/2016   Dr.Groat   Current use of long term anticoagulation    Endometriosis    Glaucoma    H/O mitral valve repair    Headache    HSV (herpes simplex virus) infection    Gluteal & near mouth   IBS (irritable bowel syndrome) 12/2002   Irregular heart beat    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, 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   Rib fracture 01/2017   Dr.Green   Severe mitral regurgitation     Past Surgical History:  Procedure Laterality Date   APPENDECTOMY  1952   HYSTEROSCOPY  06/2002   Polyp; Small fibroid    HYSTEROSCOPY  09/10/09   Myoma   MITRAL VALVE REPAIR      (plication of posterior leaflet with 28-mm Medtronic CG Future  Band annuloplasty). (02/23/08  Dr. Roxy Manns)   MYOMECTOMY     median sternotomy   PACEMAKER INSERTION  02/2008   PPM GENERATOR CHANGEOUT N/A 12/19/2020   Procedure: PPM GENERATOR CHANGEOUT;  Surgeon: Deboraha Sprang, MD;  Location: [facility];  Service: Cardiovascular;  Laterality: N/A;   TONSILLECTOMY AND ADENOIDECTOMY  1955   TUBAL LIGATION  1983     Current Outpatient Medications  Medication Sig Dispense Refill   acetaminophen (TYLENOL) 650 MG CR tablet Take 1,300 mg by mouth 2 (two) times daily.     apixaban (ELIQUIS) 2.5 MG TABS tablet Take 1 tablet (2.5 mg total) by mouth 2 (two) times daily. 180 tablet 3   Cholecalciferol (VITAMIN D HIGH POTENCY PO) Take 10,000 Int'l Units by mouth once a week. Ultra     cycloSPORINE (RESTASIS) 0.05 % ophthalmic emulsion Place 1 drop into both eyes 2 (two) times daily.     diclofenac Sodium (VOLTAREN) 1 % GEL Apply 1 application topically 2 (two) times daily. Knees     diphenhydrAMINE (BENADRYL) 25 mg capsule Take 50 mg by mouth at bedtime.      dorzolamide-timolol (COSOPT) 22.3-6.8 MG/ML ophthalmic solution Place 1 drop into both eyes 2 (two) times daily.  Eyelid Cleansers (AVENOVA) 0.01 % SOLN Place 1 application into both eyes 2 (two) times daily.     fluticasone (FLONASE) 50 MCG/ACT nasal spray Place 1 spray into both nostrils at bedtime.     latanoprost (XALATAN) 0.005 % ophthalmic solution Place 1 drop into both eyes at bedtime.     loperamide (IMODIUM A-D) 2 MG tablet Take 2 mg by mouth 4 (four) times daily as needed for diarrhea or loose stools.     MAGNESIUM CL-CALCIUM CARBONATE PO Take 1 tablet by mouth 2 (two) times daily. 500 mg /1000 mg     Omega-3 Fatty Acids (FISH OIL) 1000 MG CAPS Take 2 capsules by mouth once a week. Pro Omega D     Probiotic Product (PROBIOTIC DAILY PO) Take 1 tablet by mouth daily. ortho  spore ig     valACYclovir (VALTREX) 500 MG tablet Take 500 mg by mouth every other day.      verapamil (CALAN-SR) 120 MG CR tablet Take 1 tablet (120 mg total) by mouth 2 (two) times daily. 180 tablet 3   rosuvastatin (CRESTOR) 10 MG tablet Take 1 tablet (10 mg total) by mouth daily. 90 tablet 1   No current facility-administered medications for this visit.    Allergies:   Amiodarone and Doxycycline    Social History:  The patient  reports that she has quit smoking. She has never used smokeless tobacco. She reports current alcohol use. She reports current drug use. Drug: Marijuana.   Family History:  The patient's family history includes Breast cancer (age of onset: 70) in her maternal grandmother; Breast cancer (age of onset: 23) in her mother; Coronary artery disease in her brother and brother; Heart attack in her father; Heart disease in her mother.    ROS:  Please see the history of present illness.   Otherwise, review of systems are positive for none.   All other systems are reviewed and negative.    PHYSICAL EXAM: VS:  BP 124/60 (BP Location: Left Arm, Patient Position: Sitting, Cuff Size: Normal)   Pulse 70   Ht 5' (1.524 m)   Wt 117 lb 6.4 oz (53.3 kg)   SpO2 98%   BMI 22.93 kg/m  , BMI Body mass index is 22.93 kg/m. GEN: Well nourished, well developed, in no acute distress  HEENT: normal  Neck: no JVD, carotid bruits, or masses Cardiac: RRR; no murmurs, rubs, or gallops,no edema  Respiratory:  clear to auscultation bilaterally, normal work of breathing GI: soft, nontender, nondistended, + BS MS: no deformity or atrophy  Skin: warm and dry, no rash Neuro:  Strength and sensation are intact Psych: euthymic mood, full affect Vascular: Femoral: +2.  Posterior tibial: +1 bilaterally.  Dorsalis pedis: Not palpable on the right but +1 on the left.   EKG:  EKG is not ordered today.    Recent Labs: 12/17/2020: BUN 22; Creatinine, Ser 0.73; Hemoglobin 13.7; Platelets 233;  Potassium 4.6; Sodium 137    Lipid Panel    Component Value Date/Time   CHOL 187 08/10/2007 0840   TRIG 121 08/10/2007 0840   HDL 68.6 08/10/2007 0840   CHOLHDL 2.7 CALC 08/10/2007 0840   VLDL 24 08/10/2007 0840   LDLCALC 94 08/10/2007 0840   LDLDIRECT 135.1 04/05/2007 0849      Wt Readings from Last 3 Encounters:  05/28/21 117 lb 6.4 oz (53.3 kg)  05/14/21 116 lb (52.6 kg)  12/19/20 114 lb (51.7 kg)       PAD Screen  03/19/2017  Previous PAD dx? No  Previous surgical procedure? No  Pain with walking? No  Feet/toe relief with dangling? Yes  Painful, non-healing ulcers? No  Extremities discolored? No      ASSESSMENT AND PLAN:  1.  Peripheral arterial disease with intermittent claudication mostly affecting the right: This is due to significant stenosis in the right SFA.  There is moderate SFA disease on the left side. I discussed with her the natural history and management of peripheral arterial disease.  I explained to her that this is not a limb threatening situation and revascularization is mainly for symptom relief. I discussed with her the importance of controlling her hyperlipidemia. I recommend a walking exercise program for at least 30 minutes daily.  I favor doing this first before considering revascularization.  We will reevaluate her symptoms in 3 months.  If no improvement, we can proceed with angiography and endovascular intervention.  2.  Hyperlipidemia: She is now taking rosuvastatin 10 mg 3 times a week due to her concerns about possible side effects but she has been tolerating the medication.  I increased the medication to 10 mg daily and we will plan on checking lipid and liver profile in 3 months.  I also provided her with heart healthy diet instructions.  3.  Paroxysmal atrial fibrillation: She seems to be in sinus rhythm.  She is on anticoagulation with Eliquis.  4.  History of hypertrophic cardiomyopathy status post myomectomy and mitral valve  repair.    Disposition:   FU with me in 3 months  Signed,  Kathlyn Sacramento, MD  05/28/2021 8:50 AM    [place]

## 2021-06-24 ENCOUNTER — Ambulatory Visit (INDEPENDENT_AMBULATORY_CARE_PROVIDER_SITE_OTHER): Payer: Medicare Other

## 2021-06-24 DIAGNOSIS — I480 Paroxysmal atrial fibrillation: Secondary | ICD-10-CM | POA: Diagnosis not present

## 2021-06-25 LAB — CUP PACEART REMOTE DEVICE CHECK
Battery Remaining Longevity: 116 mo
Battery Remaining Percentage: 95.5 %
Battery Voltage: 3.02 V
Brady Statistic RA Percent Paced: 96 %
Date Time Interrogation Session: 20221010020012
Implantable Lead Implant Date: 20090617
Implantable Lead Implant Date: 20090617
Implantable Lead Location: 753859
Implantable Lead Location: 753860
Implantable Pulse Generator Implant Date: 20220406
Lead Channel Impedance Value: 410 Ohm
Lead Channel Pacing Threshold Amplitude: 0.75 V
Lead Channel Pacing Threshold Pulse Width: 0.4 ms
Lead Channel Sensing Intrinsic Amplitude: 5 mV
Lead Channel Setting Pacing Amplitude: 2 V
Pulse Gen Model: 2272
Pulse Gen Serial Number: 3911946

## 2021-07-01 ENCOUNTER — Other Ambulatory Visit: Payer: Self-pay

## 2021-07-01 ENCOUNTER — Telehealth: Payer: Self-pay | Admitting: Internal Medicine

## 2021-07-01 NOTE — Telephone Encounter (Signed)
**Note De-Identified Abdallah Hern Obfuscation** Eliquis PA started through covermymeds. Key: [id number]

## 2021-07-01 NOTE — Telephone Encounter (Signed)
Pt c/o medication issue:  1. Name of Medication: apixaban (ELIQUIS) 2.5 MG TABS tablet  2. How are you currently taking this medication (dosage and times per day)? As directed   3. Are you having a reaction (difficulty breathing--STAT)? no  4. What is your medication issue? Medication needs authorization from our office before the pharmacy can fill the rx. Please send updated rx to   [place], [place] - [id number] [place] [address] [place] [address]

## 2021-07-02 ENCOUNTER — Other Ambulatory Visit: Payer: Self-pay | Admitting: Pharmacist

## 2021-07-02 MED ORDER — APIXABAN 2.5 MG PO TABS: 2.5000 mg | ORAL_TABLET | Freq: Two times a day (BID) | ORAL | 1 refills | Status: DC

## 2021-07-02 NOTE — Telephone Encounter (Signed)
**Note De-Identified [name] Obfuscation** The pt states that [facility] called her this morning and explained to her that her Eliquis does not require a PA and that she is in her donut hole. I attempted to discuss pt asst for Eliquis through [facility] but she is not interested at this time. She is aware to call us back if she changes her mind. She thanked me for my call.

## 2021-07-02 NOTE — Telephone Encounter (Signed)
Received fax that PA not needed, Eliquis is on pt's formulary. Unclear why pharmacy told her that an authorization was required when it isn't. I faxed this letter to her pharmacy.

## 2021-07-02 NOTE — Progress Notes (Signed)
Remote pacemaker transmission.   

## 2021-07-30 ENCOUNTER — Ambulatory Visit: Payer: Medicare Other | Attending: Family Medicine

## 2021-07-30 ENCOUNTER — Other Ambulatory Visit: Payer: Self-pay

## 2021-07-30 DIAGNOSIS — R293 Abnormal posture: Secondary | ICD-10-CM | POA: Insufficient documentation

## 2021-07-30 DIAGNOSIS — M6281 Muscle weakness (generalized): Secondary | ICD-10-CM | POA: Insufficient documentation

## 2021-07-30 NOTE — Therapy (Signed)
[facility] @ [address] [place], [place], [address] Phone: [phone]   Fax:  [phone]  Physical Therapy Evaluation  Patient Details  Name: [name] MRN: [id number] Date of Birth: [birth year] Referring Provider (PT): Anastasia Pall, MD   Encounter Date: 07/30/2021   PT End of Session - 07/30/21 1138     Visit Number 1    Date for PT Re-Evaluation 09/10/21    Authorization Type [employer]    Progress Note Due on Visit 10    PT Start Time 1104    PT Stop Time 1139    PT Time Calculation (min) 35 min    Activity Tolerance Patient tolerated treatment well    Behavior During Therapy [facility] for tasks assessed/performed             Past Medical History:  Diagnosis Date   Asymmetric septal hypertrophy ([place])    Atrial fibrillation ([place])    with permanent pacemaker   Blepharitis 10/2016   Dr.Groat   Current use of long term anticoagulation    Endometriosis    Glaucoma    H/O mitral valve repair    Headache    HSV (herpes simplex virus) infection    Gluteal & near mouth   IBS (irritable bowel syndrome) 12/2002   Irregular heart beat    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, 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   Rib fracture 01/2017   Dr.Green   Severe mitral regurgitation     Past Surgical History:  Procedure Laterality Date   APPENDECTOMY  1952   HYSTEROSCOPY  06/2002   Polyp; Small fibroid   HYSTEROSCOPY  09/10/09   Myoma   MITRAL VALVE REPAIR      (plication of posterior leaflet with 28-mm Medtronic CG Future  Band annuloplasty). (02/23/08  Dr. Roxy Manns)   MYOMECTOMY     median sternotomy   PACEMAKER INSERTION  02/2008   PPM GENERATOR CHANGEOUT N/A 12/19/2020   Procedure: PPM GENERATOR CHANGEOUT;  Surgeon: Deboraha Sprang, MD;  Location: [facility];  Service: Cardiovascular;  Laterality: N/A;   [place]    There were no  vitals filed for this visit.    Subjective Assessment - 07/30/21 1111     Subjective Pt presents to PT with diagnosis of osteoporosis for education and HEP advancement.    Pertinent History osteoporosis, peripheral arterial disease    Diagnostic tests recent bone density scan- osteoprosis diagnosis (t-scores not available)    Patient Stated Goals education regarding osteoporosis    Currently in Pain? No/denies                [facility] PT Assessment - 07/30/21 0001       Assessment   Medical Diagnosis osteoporosis    Referring Provider (PT) Anastasia Pall, MD    Onset Date/Surgical Date 06/05/21    Prior Therapy NA      Precautions   Precautions Other (comment)   osteoporosis     Balance Screen   Has the patient fallen in the past 6 months No    Has the patient had a decrease in activity level because of a fear of falling?  No    Is the patient reluctant to leave their home because of a fear of falling?  No      Home Ecologist residence    Living Arrangements  Spouse/significant other    Type of Home House      Prior Function   Level of Independence Independent    Vocation Retired    Leisure gardening, walking for exercise, daily online aerobics      Observation/Other Assessments   Focus on Therapeutic Outcomes (FOTO)  NA      Posture/Postural Control   Posture/Postural Control Postural limitations    Postural Limitations Rounded Shoulders;Forward head      ROM / Strength   AROM / PROM / Strength AROM;Strength      AROM   Overall AROM  Within functional limits for tasks performed      Strength   Overall Strength Within functional limits for tasks performed    Overall Strength Comments UE and LE 4+/5 throughout      Transfers   Transfers Independent with all Transfers      Ambulation/Gait   Ambulation/Gait Yes    Gait Pattern Within Functional Limits                        Objective measurements completed  on examination: See above findings.                PT Education - 07/30/21 1131     Education Details Access Code: [email or web address], do and dont of osteoporosis, body mechanics education    Person(s) Educated Patient    Methods Explanation;Demonstration;Handout    Comprehension Verbalized understanding;Returned demonstration              PT Short Term Goals - 07/30/21 1142       PT SHORT TERM GOAL #1   Title be independent in intial HEP    Time 3    Period Weeks    Target Date 08/20/21               PT Long Term Goals - 07/30/21 1142       PT LONG TERM GOAL #1   Title Patient will verbally understand correct body mechanics for home and work tasks to decrease strain on spine.    Time 6    Period Weeks    Status New    Target Date 09/10/21      PT LONG TERM GOAL #2   Title Patient will verbally understand ways to strengthen postural musculature.    Time 6    Period Weeks    Status New    Target Date 09/10/21      PT LONG TERM GOAL #3   Title be independent in  HEP focusing on managing osteoporosis postural deficits and strength deficits    Time 6    Period Weeks    Status New    Target Date 09/10/21      PT LONG TERM GOAL #4   Title Patient can verbally understand the dos and don'ts of osteoporosis management    Time 6    Period Weeks    Status New    Target Date 09/10/21                    Plan - 07/30/21 1207     Clinical Impression Statement Pt reports to PT with recent diagnosis of osteoporosis for education and HEP progression.  Pt does not know her current t-score and this was not provided by PT.  Pt is a regular exerciser and is highly motivated.  Pt with mild forward head and rounded shoulder posture.  Pt with 4+/5  bil UE and LE strength throughout. Pt was educated regarding basic body mechanics and the do's and don'ts of osteoporosis in addition to initiation of HEP for postural and weightbearing strength.  Pt will return in 2  weeks for advancement of HEP and continued osteoporosis education.    Personal Factors and Comorbidities Comorbidity 1    Comorbidities osteoporosis    Examination-Activity Limitations Lift;Carry    Examination-Participation Restrictions Meal Prep;Community Activity    Stability/Clinical Decision Making Stable/Uncomplicated    Clinical Decision Making Low    Rehab Potential Excellent    PT Frequency 1x / week    PT Duration 8 weeks    PT Treatment/Interventions ADLs/Self Care Home Management;Therapeutic exercise;Therapeutic activities;Balance training;Neuromuscular re-education;Patient/family education;Manual techniques    PT Next Visit Plan review HEP and add theraband, practice body mecahnics    PT Home Exercise Plan Access Code: [email or web address]    [email or web address] and Agree with Plan of Care Patient             Patient will benefit from skilled therapeutic intervention in order to improve the following deficits and impairments:  Postural dysfunction, Decreased strength  Visit Diagnosis: Abnormal posture - Plan: PT plan of care cert/re-cert  Muscle weakness (generalized) - Plan: PT plan of care cert/re-cert     Problem List Patient Active Problem List   Diagnosis Date Noted   Hypokalemia 05/03/2017   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

## 2021-07-30 NOTE — Patient Instructions (Addendum)
DO's and DON'T's  Avoid and/or Minimize positions of forward bending ( flexion) Side bending and rotation of the trunk Especially when movements occur together   When your back aches:  Don't sit down  Lie down on your back with a small pillow under your head and one under your knees or as outlined by our therapist. Or, lie in the 90/90 position ( on the floor with your feet and legs on the sofa with knees and hips bent to 90 degrees)  Tying or putting on your shoes:  Don't bend over to tie your shoes or put on socks. Instead, bring one foot up, cross it over the opposite knee and bend forward (hinge) at the hips to so the task.  Keep your back straight.  If you cannot do this safely, then you need to use long handled assistive devices such as a shoehorn and sock puller.  Exercising: Don't engage in ballistic types of exercise routines such as high-impact aerobics or jumping rope Don't do exercises in the gym that bring you forward (abdominal crunches, sit-ups, touching your  toes, knee-to-chest, straight leg raising.) Follow a regular exercise program that includes a variety of different weight-bearing activities, such as low-impact aerobics, T' ai chi or walking as your physical therapist advises Do exercises that emphasize return to normal body alignment and strengthening of the muscles that keep your back straight, as outlined in this program or by your therapist  Household tasks: Don't reach unnecessarily or twist your trunk when mopping, sweeping, vacuuming, raking, making beds, weeding gardens, getting objects ou of cupboards, etc. Keep your broom, mop, vacuum, or rake close to you and mover your whole body as you move them. Walk over to the area on which you are working. Arrange kitchen, bathroom, and bedroom shelves so that frequently used items may be reached without excessive bending, twisting, and reaching.  Use a sturdy stool if  necessary. Don't bend from the waist to pick up something up  Off the floor, out of the trunk of your car, or to brush your teeth, wash your face, etc.  Bend at the knees, keeping back straight as possible. Use a reacher if necessary.   Prevention of fracture is the so-called "BOTTOm -Line" in the management of OSTEOPOROSIS. Do not take unnecessary chances in movement. Once a compression fracture occurs, the process is very difficult to control; one fracture is frequently followed by many more.       Lifting Principles  Maintain proper posture and head alignment. Slide object as close as possible before lifting. Move obstacles out of the way. Test before lifting; ask for help if too heavy. Tighten stomach muscles without holding breath. Use smooth movements; do not jerk. Use legs to do the work, and pivot with feet. Distribute the work load symmetrically and close to the center of trunk. Push instead of pull whenever possible.   Squat down and hold basket close to stand. Use leg muscles to do the work.    Avoid twisting or bending back. Pivot around using foot movements, and bend at knees if needed when reaching for articles.        Getting Into / Out of Bed   Lower self to lie down on one side by raising legs and lowering head at the same time. Use arms to assist moving without twisting. Bend both knees to roll onto back if desired. To sit up, start from lying on side, and use same move-ments in reverse. Keep trunk aligned  with legs.    Shift weight from front foot to back foot as item is lifted off shelf.    When leaning forward to pick object up from floor, extend one leg out behind. Keep back straight. Hold onto a sturdy support with other hand.      Sit upright, head facing forward. Try using a roll to support lower back. Keep shoulders relaxed, and avoid rounded back. Keep hips level with knees. Avoid crossing legs for long periods.  Access Code:  [email or web address] URL: [email or web address] Date: 07/30/2021 Prepared by: Claiborne Billings  Exercises Sit to Stand Without Arm Support - 1-2 x daily - 7 x weekly - 2 sets - 10 reps Seated Shoulder Abduction - Palms Down - 1-2 x daily - 7 x weekly - 2 sets - 10 reps Seated Shoulder Flexion - 1-2 x daily - 7 x weekly - 2 sets - 10 reps Seated Shoulder Scaption - 1-2 x daily - 7 x weekly - 2 sets - 10 reps Standing Hip Abduction with Counter Support - 1-2 x daily - 7 x weekly - 2 sets - 10 reps    [facility] [address], [place], [place] [address] Phone # [phone] Fax [phone]

## 2021-08-14 ENCOUNTER — Other Ambulatory Visit: Payer: Self-pay

## 2021-08-14 ENCOUNTER — Ambulatory Visit: Payer: Medicare Other

## 2021-08-14 DIAGNOSIS — R293 Abnormal posture: Secondary | ICD-10-CM | POA: Diagnosis not present

## 2021-08-14 DIAGNOSIS — M6281 Muscle weakness (generalized): Secondary | ICD-10-CM

## 2021-08-14 NOTE — Therapy (Addendum)
[facility] @ [place], [place], [address] Phone: [phone]   Fax:  [phone]  Physical Therapy Treatment  Patient Details  Name: [name] MRN: [id number] Date of Birth: 1940/12/01 Referring Provider (PT): Anastasia Pall, MD   Encounter Date: 08/14/2021   PT End of Session - 08/14/21 1432     Visit Number 2    Date for PT Re-Evaluation 09/10/21    Authorization Type Blue Medicare    Progress Note Due on Visit 10    PT Start Time 1401    PT Stop Time 3291    PT Time Calculation (min) 27 min    Activity Tolerance Patient tolerated treatment well    Behavior During Therapy [facility] for tasks assessed/performed             Past Medical History:  Diagnosis Date   Asymmetric septal hypertrophy ([place])    Atrial fibrillation ([place])    with permanent pacemaker   Blepharitis 10/2016   Dr.Groat   Current use of long term anticoagulation    Endometriosis    Glaucoma    H/O mitral valve repair    Headache    HSV (herpes simplex virus) infection    Gluteal & near mouth   IBS (irritable bowel syndrome) 12/2002   Irregular heart beat    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, 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   Rib fracture 01/2017   Dr.Green   Severe mitral regurgitation     Past Surgical History:  Procedure Laterality Date   APPENDECTOMY  1952   HYSTEROSCOPY  06/2002   Polyp; Small fibroid   HYSTEROSCOPY  09/10/09   Myoma   MITRAL VALVE REPAIR      (plication of posterior leaflet with 28-mm Medtronic CG Future  Band annuloplasty). (02/23/08  Dr. Roxy Manns)   MYOMECTOMY     median sternotomy   PACEMAKER INSERTION  02/2008   PPM GENERATOR CHANGEOUT N/A 12/19/2020   Procedure: PPM GENERATOR CHANGEOUT;  Surgeon: Deboraha Sprang, MD;  Location: [facility];  Service: Cardiovascular;  Laterality: N/A;   [place]    There were no  vitals filed for this visit.   Subjective Assessment - 08/14/21 1358     Subjective I've been doing my exercises.  I got my t-scores.  -2 range in the spine and Lt femur are worse.  I'm going to start [place].    Patient Stated Goals education regarding osteoporosis    Currently in Pain? No/denies                [facility] PT Assessment - 08/14/21 0001       Assessment   Medical Diagnosis osteoporosis    Referring Provider (PT) Anastasia Pall, MD    Onset Date/Surgical Date 06/05/21      Prior Function   Level of Independence Independent    Vocation Retired    Leisure gardening, walking for exercise, daily online aerobics                           [facility] Adult PT Treatment/Exercise - 08/14/21 0001       Exercises   Exercises Shoulder;Knee/Hip      Knee/Hip Exercises: Standing   Hip Abduction Both;10 reps;Knee straight    Hip Extension Stengthening;Both;10 reps;Knee straight      Knee/Hip Exercises: Seated  Sit to Sand 10 reps   holding 4#     Shoulder Exercises: Seated   Horizontal ABduction Strengthening;20 reps;Theraband    Theraband Level (Shoulder Horizontal ABduction) Level 2 (Red)    External Rotation Strengthening;Both;Theraband;20 reps    Theraband Level (Shoulder External Rotation) Level 2 (Red)    Other Seated Exercises 3 way raises: 2# x10 each                     PT Education - 08/14/21 1422     Education Details Access Code: [email or web address], verbal review of body mechanics and do's/don't of osteoporosis    Person(s) Educated Patient    Methods Explanation;Demonstration    Comprehension Verbalized understanding;Returned demonstration              PT Short Term Goals - 07/30/21 1142       PT SHORT TERM GOAL #1   Title be independent in intial HEP    Time 3    Period Weeks    Target Date 08/20/21               PT Long Term Goals - 08/14/21 1431       PT LONG TERM GOAL #1   Title Patient will verbally  understand correct body mechanics for home and work tasks to decrease strain on spine.    Status Achieved      PT LONG TERM GOAL #2   Title Patient will verbally understand ways to strengthen postural musculature.    Status Achieved      PT LONG TERM GOAL #3   Title be independent in  HEP focusing on managing osteoporosis postural deficits and strength deficits    Status Achieved      PT LONG TERM GOAL #4   Title Patient can verbally understand the dos and don'ts of osteoporosis management    Status Achieved                   Plan - 08/14/21 1435     Clinical Impression Statement First time follow-up after evaluation.  Pt has been working on initial HEP and was able to demonstrate all aspects correctly.  Minor cueing required for scapular position and alignment.  PT added scapular theraband to HEP to interchange with 3 way raises for further postural strength and stabilization.  PT reviewed body mechanics modifications (no twisting or bending) and importance of alignment and weightbearing strength exercises to deter bone loss and reduce risk of fracture.  Pt will be placed on hold until the end of her plan of care.  PT will discharge if pt doesn't return.    PT Frequency 1x / week    PT Duration 8 weeks    PT Treatment/Interventions ADLs/Self Care Home Management;Therapeutic exercise;Therapeutic activities;Balance training;Neuromuscular re-education;Patient/family education;Manual techniques    PT Next Visit Plan Hold chart and D/C if pt doesnt return by 09/24/21    PT Home Exercise Plan Access Code: [email or web address]    Consulted and Agree with Plan of Care Patient             Patient will benefit from skilled therapeutic intervention in order to improve the following deficits and impairments:     Visit Diagnosis: Abnormal posture  Muscle weakness (generalized)     Problem List Patient Active Problem List   Diagnosis Date Noted   Hypokalemia 05/03/2017   Proctocolitis  05/02/2017   Chronic anticoagulation 05/02/2017   Sinus node dysfunction ([place]) 12/03/2011   MITRAL  REGURGITATION 12/07/2008   Hypertrophic obstructive cardiomyopathy ([place]) 12/07/2008   Atrial fibrillation ([place]) 12/07/2008   PACEMAKER-St.Jude 12/07/2008    Sigurd Sos, PT 08/14/21 2:38 PM   PHYSICAL THERAPY DISCHARGE SUMMARY  Visits from Start of Care: 2  Current functional level related to goals / functional outcomes: See above for most current PT status.  Pt requested to be put on hold to do her HEP independently.    Remaining deficits: No functional deficits at this time.    Education / Equipment: Osteoporosis education, Economist education, HEP   Patient agrees to discharge. Patient goals were partially met. Patient is being discharged due to the patient's request.  Sigurd Sos, PT 09/24/21 4:19 PM   [facility] @ [place], [place], [address] Phone: [phone]   Fax:  [phone]  Name: DANAKA LLERA MRN: [id number] Date of Birth: 01-05-1941

## 2021-08-14 NOTE — Patient Instructions (Signed)
Access Code: [email or web address] URL: [email or web address] Date: 08/14/2021 Prepared by: Claiborne Billings   Standing Shoulder External Rotation with Resistance - 1 x daily - 7 x weekly - 2 sets - 10 reps Seated Shoulder Horizontal Abduction with Resistance - 1 x daily - 7 x weekly - 2 sets - 10 reps Standing Shoulder Single Arm PNF D2 Flexion with Resistance - 1 x daily - 7 x weekly - 3 sets - 10 reps Seated Transversus Abdominis Bracing - 1 x daily - 7 x weekly - 3 sets - 10 reps

## 2021-08-22 ENCOUNTER — Encounter: Payer: Self-pay | Admitting: Cardiovascular Disease

## 2021-08-27 ENCOUNTER — Ambulatory Visit: Payer: Medicare Other

## 2021-08-30 ENCOUNTER — Other Ambulatory Visit: Payer: Self-pay

## 2021-08-30 DIAGNOSIS — I739 Peripheral vascular disease, unspecified: Secondary | ICD-10-CM

## 2021-08-31 LAB — HEPATIC FUNCTION PANEL
ALT: 17 IU/L (ref 0–32)
AST: 19 IU/L (ref 0–40)
Albumin: 4.4 g/dL (ref 3.7–4.7)
Alkaline Phosphatase: 83 IU/L (ref 44–121)
Bilirubin Total: 0.7 mg/dL (ref 0.0–1.2)
Bilirubin, Direct: 0.19 mg/dL (ref 0.00–0.40)
Total Protein: 6.4 g/dL (ref 6.0–8.5)

## 2021-08-31 LAB — LIPID PANEL
Chol/HDL Ratio: 2 ratio (ref 0.0–4.4)
Cholesterol, Total: 145 mg/dL (ref 100–199)
HDL: 72 mg/dL (ref >39)
LDL Chol Calc (NIH): 48 mg/dL (ref 0–99)
Triglycerides: 153 mg/dL — ABNORMAL HIGH (ref 0–149)
VLDL Cholesterol Cal: 25 mg/dL (ref 5–40)

## 2021-09-05 ENCOUNTER — Ambulatory Visit: Payer: Medicare Other | Admitting: Pharmacist Clinician (PhC)/ Clinical Pharmacy Specialist

## 2021-09-05 ENCOUNTER — Other Ambulatory Visit: Payer: Self-pay

## 2021-09-05 DIAGNOSIS — E782 Mixed hyperlipidemia: Secondary | ICD-10-CM

## 2021-09-05 DIAGNOSIS — E785 Hyperlipidemia, unspecified: Secondary | ICD-10-CM | POA: Insufficient documentation

## 2021-09-05 NOTE — Patient Instructions (Signed)
Your Results:             Your most recent labs Goal  Total Cholesterol 142 < 200  Triglycerides 153 < 150  HDL (happy/good cholesterol) 72 > 40  LDL (lousy/bad cholesterol 48 < 55   Medication changes:  Continue with rosuvastatin 10 mg   Lab orders:  Repeat cholesterol labs about every 6-12 months    Thank you for choosing [facility]

## 2021-09-05 NOTE — Progress Notes (Signed)
09/05/2021 [name] May 30, 1941 [id number]   HPI:  [name] is a 80 y.o. female patient of Dr Fletcher Anon, who presents today for a lipid clinic evaluation.  See pertinent past medical history below.  She was seen by Dr. Fletcher Anon in September, who recommended daily walking to help with claudication symptoms.  He also increased her rosuvastatin to 10 mg daily (form 3 times per week and asked her to get labs and follow up with [facility] after 3 months on the daily dose.    Today she returns.  States that her legs are feeling much better since she started walking 30 minutes most days of the week.  Only misses if the weather is too wet.    Past Medical History: PAD Significant SFA stenosis and intermittent claudication  AF Paroxysmal, CHADS2-VASc 4 (CHF, age x 2, female)    Current Medications: rosuvastatin 10 mg daily  Cholesterol Goals: LDL < 55  Family history: both parents - father died from MI, mother from stenosis 49, 4; 3 brothers 2/3 with heart idsease both with cabg; 1 son, has escaped probs so far  Diet: mostly home cooked, adimts to liking potatoes, rice eggs, avoids junk foods processed foods.   Exercise:  walking most days as well as some aeorbic exercise, leg pain considerably less 30-40 minutes [place] 15+ min of aerobic exercise also daily  Labs: 12/22:  TC 142, TG 153, HDL 72, LDL 48 (on rosuvastatin 10 mg daily)   Current Outpatient Medications  Medication Sig Dispense Refill   acetaminophen (TYLENOL) 650 MG CR tablet Take 1,300 mg by mouth 2 (two) times daily.     apixaban (ELIQUIS) 2.5 MG TABS tablet Take 1 tablet (2.5 mg total) by mouth 2 (two) times daily. 180 tablet 1   Cholecalciferol (VITAMIN D HIGH POTENCY PO) Take 10,000 Int'l Units by mouth once a week. Ultra     cycloSPORINE (RESTASIS) 0.05 % ophthalmic emulsion Place 1 drop into both eyes 2 (two) times daily.     diclofenac Sodium (VOLTAREN) 1 % GEL Apply 1 application  topically 2 (two) times daily. Knees     diphenhydrAMINE (BENADRYL) 25 mg capsule Take 50 mg by mouth at bedtime.      dorzolamide-timolol (COSOPT) 22.3-6.8 MG/ML ophthalmic solution Place 1 drop into both eyes 2 (two) times daily.     Eyelid Cleansers (AVENOVA) 0.01 % SOLN Place 1 application into both eyes 2 (two) times daily.     fluticasone (FLONASE) 50 MCG/ACT nasal spray Place 1 spray into both nostrils at bedtime.     latanoprost (XALATAN) 0.005 % ophthalmic solution Place 1 drop into both eyes at bedtime.     loperamide (IMODIUM A-D) 2 MG tablet Take 2 mg by mouth 4 (four) times daily as needed for diarrhea or loose stools.     MAGNESIUM CL-CALCIUM CARBONATE PO Take 1 tablet by mouth 2 (two) times daily. 500 mg /1000 mg     Omega-3 Fatty Acids (FISH OIL) 1000 MG CAPS Take 2 capsules by mouth once a week. Pro Omega D     Probiotic Product (PROBIOTIC DAILY PO) Take 1 tablet by mouth daily. ortho spore ig     rosuvastatin (CRESTOR) 10 MG tablet Take 1 tablet (10 mg total) by mouth daily. 90 tablet 1   valACYclovir (VALTREX) 500 MG tablet Take 500 mg by mouth every other day.      verapamil (CALAN-SR) 120 MG CR tablet Take 1 tablet (  120 mg total) by mouth 2 (two) times daily. 180 tablet 3   No current facility-administered medications for this visit.    Allergies  Allergen Reactions   Amiodarone     Severe rash and upset stomach   Doxycycline     Unknown - pt had other issues going on at the time, unsure if this medication had anything to do with how she felt    Past Medical History:  Diagnosis Date   Asymmetric septal hypertrophy (HCC)    Atrial fibrillation ([place])    with permanent pacemaker   Blepharitis 10/2016   Dr.Groat   Current use of long term anticoagulation    Endometriosis    Glaucoma    H/O mitral valve repair    Headache    HSV (herpes simplex virus) infection    Gluteal & near mouth   IBS (irritable bowel syndrome) 12/2002   Irregular heart beat    Pacemaker  02/2008   inserted, 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   Rib fracture 01/2017   Dr.Green   Severe mitral regurgitation     Blood pressure 124/78, pulse 70, resp. rate 17, height 5' (1.524 m), weight 119 lb (54 kg), SpO2 100 %.   Hyperlipidemia Patient with PAD and hyperlipidemia, LDL currently at goal (48) on just rosuvastatin 10 mg.  She has tolerated the increase to daily rosuvastatin without any concerns.  Reviewed cholesterol goals and importance of monitoring LDL cholesterol.  Patient was praised on exercise regimen and she is happy about feeling symptom free.  Advised she continue with this regimen and see Dr. Fletcher Anon in 3-6 months to keep close follow up.     Tommy Medal PharmD CPP [facility] [address] [place], [place] [address] [phone]

## 2021-09-05 NOTE — Assessment & Plan Note (Signed)
Patient with PAD and hyperlipidemia, LDL currently at goal (48) on just rosuvastatin 10 mg.  She has tolerated the increase to daily rosuvastatin without any concerns.  Reviewed cholesterol goals and importance of monitoring LDL cholesterol.  Patient was praised on exercise regimen and she is happy about feeling symptom free.  Advised she continue with this regimen and see Dr. Fletcher Anon in 3-6 months to keep close follow up.

## 2021-09-23 ENCOUNTER — Ambulatory Visit (INDEPENDENT_AMBULATORY_CARE_PROVIDER_SITE_OTHER): Payer: PPO

## 2021-09-23 DIAGNOSIS — I495 Sick sinus syndrome: Secondary | ICD-10-CM

## 2021-09-24 ENCOUNTER — Ambulatory Visit: Payer: Medicare Other | Admitting: Cardiovascular Disease

## 2021-09-24 LAB — CUP PACEART REMOTE DEVICE CHECK
Battery Remaining Longevity: 113 mo
Battery Remaining Percentage: 95.5 %
Battery Voltage: 3.02 V
Brady Statistic RA Percent Paced: 96 %
Date Time Interrogation Session: 20230109020014
Implantable Lead Implant Date: 20090617
Implantable Lead Implant Date: 20090617
Implantable Lead Location: 753859
Implantable Lead Location: 753860
Implantable Pulse Generator Implant Date: 20220406
Lead Channel Impedance Value: 400 Ohm
Lead Channel Pacing Threshold Amplitude: 0.75 V
Lead Channel Pacing Threshold Pulse Width: 0.4 ms
Lead Channel Sensing Intrinsic Amplitude: 5 mV
Lead Channel Setting Pacing Amplitude: 2 V
Pulse Gen Model: 2272
Pulse Gen Serial Number: 3911946

## 2021-09-30 ENCOUNTER — Ambulatory Visit: Payer: PPO | Admitting: Dermatology

## 2021-09-30 ENCOUNTER — Other Ambulatory Visit: Payer: Self-pay

## 2021-09-30 ENCOUNTER — Encounter: Payer: Self-pay | Admitting: Dermatology

## 2021-09-30 DIAGNOSIS — Z1283 Encounter for screening for malignant neoplasm of skin: Secondary | ICD-10-CM

## 2021-09-30 DIAGNOSIS — Z85820 Personal history of malignant melanoma of skin: Secondary | ICD-10-CM

## 2021-09-30 DIAGNOSIS — R21 Rash and other nonspecific skin eruption: Secondary | ICD-10-CM | POA: Diagnosis not present

## 2021-09-30 NOTE — Patient Instructions (Signed)
Pick up over the counter hydrocortisone for rash on top of left foot.

## 2021-10-02 NOTE — Progress Notes (Signed)
Remote pacemaker transmission.   

## 2021-10-20 ENCOUNTER — Encounter: Payer: Self-pay | Admitting: Dermatology

## 2021-10-20 NOTE — Progress Notes (Signed)
° °  Follow-Up Visit   Subjective  [name] is a 81 y.o. female who presents for the following: Annual Exam (Pt states no new concerns. Personal history of malignant melanoma. ).  General skin examination, history of melanoma thigh, rash on foot for several days Location:  Duration:  Quality:  Associated Signs/Symptoms: Modifying Factors:  Severity:  Timing: Context:   Objective  Well appearing patient in no apparent distress; mood and affect are within normal limits. Scalp Raised moles and keratosis on the back and left jaw line.  Dermoscopy typical.     Left Dorsum of Foot Subtle patchy dermatitis on bilateral margin: Itching not too severe  Right Thigh - Anterior No sign of recurrent pigmentation    A full examination was performed including scalp, head, eyes, ears, nose, lips, neck, chest, axillae, abdomen, back, buttocks, bilateral upper extremities, bilateral lower extremities, hands, feet, fingers, toes, fingernails, and toenails. All findings within normal limits unless otherwise noted below.  Areas beneath undergarments not fully examined.   Assessment & Plan    Encounter for screening for malignant neoplasm of skin Scalp  Annual skin examination, encouraged to self examine with spouse twice annually.  Rash and other nonspecific skin eruption Left Dorsum of Foot  Pick up over the counter hydrocortisone for rash on top of left foot.  Apply daily after bathing.  If no improvement in 2 weeks to contact me.  Personal history of malignant melanoma of skin Right Thigh - Anterior  Check as needed change      I, Jennifer Monarch, MD, have reviewed all documentation for this visit.  The documentation on 10/20/21 for the exam, diagnosis, procedures, and orders are all accurate and complete.

## 2021-10-22 ENCOUNTER — Other Ambulatory Visit: Payer: Self-pay

## 2021-10-22 MED ORDER — VERAPAMIL HCL ER 120 MG PO TBCR: 120.0000 mg | EXTENDED_RELEASE_TABLET | Freq: Two times a day (BID) | ORAL | 2 refills | Status: DC

## 2021-11-16 ENCOUNTER — Encounter: Payer: Self-pay | Admitting: Cardiovascular Disease

## 2021-11-24 ENCOUNTER — Other Ambulatory Visit: Payer: Self-pay | Admitting: Cardiovascular Disease

## 2021-11-25 NOTE — Telephone Encounter (Signed)
Refill request

## 2021-11-26 ENCOUNTER — Other Ambulatory Visit: Payer: Self-pay | Admitting: *Deleted

## 2021-11-26 DIAGNOSIS — I480 Paroxysmal atrial fibrillation: Secondary | ICD-10-CM

## 2021-11-26 MED ORDER — APIXABAN 2.5 MG PO TABS: 2.5000 mg | ORAL_TABLET | Freq: Two times a day (BID) | ORAL | 1 refills | Status: DC

## 2021-11-26 NOTE — Telephone Encounter (Signed)
Eliquis 2.'5mg'$  paper refill request received. Patient is 81 years old, weight-54kg, Crea-0.73 on 12/17/2020, Diagnosis-Afib, and last seen by Dr. Fletcher Anon on 05/28/2021. Dose is appropriate based on dosing criteria. Will send in refill to requested pharmacy.   ?

## 2021-12-23 ENCOUNTER — Ambulatory Visit (INDEPENDENT_AMBULATORY_CARE_PROVIDER_SITE_OTHER): Payer: PPO

## 2021-12-23 DIAGNOSIS — I495 Sick sinus syndrome: Secondary | ICD-10-CM | POA: Diagnosis not present

## 2021-12-24 LAB — CUP PACEART REMOTE DEVICE CHECK
Battery Remaining Longevity: 110 mo
Battery Remaining Percentage: 95 %
Battery Voltage: 3.02 V
Brady Statistic RA Percent Paced: 96 %
Date Time Interrogation Session: 20230410020014
Implantable Lead Implant Date: 20090617
Implantable Lead Implant Date: 20090617
Implantable Lead Location: 753859
Implantable Lead Location: 753860
Implantable Pulse Generator Implant Date: 20220406
Lead Channel Impedance Value: 400 Ohm
Lead Channel Pacing Threshold Amplitude: 0.75 V
Lead Channel Pacing Threshold Pulse Width: 0.4 ms
Lead Channel Sensing Intrinsic Amplitude: 5 mV
Lead Channel Setting Pacing Amplitude: 2 V
Pulse Gen Model: 2272
Pulse Gen Serial Number: 3911946

## 2022-01-09 NOTE — Progress Notes (Signed)
Remote pacemaker transmission.   

## 2022-01-14 ENCOUNTER — Ambulatory Visit: Payer: Medicare Other | Admitting: Cardiovascular Disease

## 2022-01-21 ENCOUNTER — Ambulatory Visit: Payer: PPO | Admitting: Cardiovascular Disease

## 2022-01-21 ENCOUNTER — Encounter: Payer: Self-pay | Admitting: Cardiovascular Disease

## 2022-01-21 VITALS — BP 122/70 | HR 72 | Ht 59.0 in | Wt 116.8 lb

## 2022-01-21 DIAGNOSIS — E785 Hyperlipidemia, unspecified: Secondary | ICD-10-CM

## 2022-01-21 DIAGNOSIS — I739 Peripheral vascular disease, unspecified: Secondary | ICD-10-CM

## 2022-01-21 DIAGNOSIS — I480 Paroxysmal atrial fibrillation: Secondary | ICD-10-CM | POA: Diagnosis not present

## 2022-01-21 NOTE — Patient Instructions (Signed)
Medication Instructions:  ?No changes ?*If you need a refill on your cardiac medications before your next appointment, please call your pharmacy* ? ? ?Lab Work: ?None ordered ?If you have labs (blood work) drawn today and your tests are completely normal, you will receive your results only by: ?MyChart Message (if you have MyChart) OR ?A paper copy in the mail ?If you have any lab test that is abnormal or we need to change your treatment, we will call you to review the results. ? ? ?Testing/Procedures: ?None ordered ? ? ?Follow-Up: ?At [facility], you and your health needs are our priority.  As part of our continuing mission to provide you with exceptional heart care, we have created designated Provider Care Teams.  These Care Teams include your primary Cardiologist (physician) and Advanced Practice Providers (APPs -  Physician Assistants and Nurse Practitioners) who all work together to provide you with the care you need, when you need it. ? ?We recommend signing up for the patient portal called "MyChart".  Sign up information is provided on this After Visit Summary.  MyChart is used to connect with patients for Virtual Visits (Telemedicine).  Patients are able to view lab/test results, encounter notes, upcoming appointments, etc.  Non-urgent messages can be sent to your provider as well.   ?To learn more about what you can do with MyChart, go to [email or web address].   ? ?Your next appointment:   ?6 month(s) ? ?The format for your next appointment:   ?In Person ? ?Provider:   ?Dr. Fletcher Anon ? ? ?Other Instructions ?EXERCISE PROGRAM FOR INDIVIDUALS WITH  ?PERIPHERAL ARTERIAL DISEASE (PAD)  ? ?General Information:  ? ?Research in vascular exercise has demonstrated remarkable improvement in symptoms of leg pain (claudication) without expensive or invasive interventions. Regular walking programs are extremely helpful for patients with PAD and intermittent claudication.  ?These steps are designed to help you get  started with a safe and effective program to help you walk farther with less pain:  ? Walk at least three times a week (preferably every day). ? Your goal is to build up to 30-45 minutes of total walking time (not counting rest breaks). It may take you several weeks to build up your exercise time starting at 5-10 minutes or whatever you can tolerate. ? Walk as far as possible using moderate to maximal pain (7-8 on the scale below) as a signal to stop, and resume walking when the pain goes away. ? On a treadmill, set the speed and grade at a level that brings on the claudication pain within 3 to 5 minutes. Walk at this rate until you experience claudication of moderate severity, rest until the pain improves, and then resume walking. ? Over time, you will be able to walk longer at the designated speed and grade; workload should then be increased until you develop the pain within 3 to 5 minutes once again. ? This regimen will induce a significant benefit. Studies have demonstrated that participants may be able to walk up to three or four times farther and have less leg pain, within twelve weeks, by following this protocol. ? ?Pain Scale  ? ? 0_____1_____2_____3_____4_____5_____6_____7_____8_____9_____10  ? No Pain                                   Moderate Pain  Maximal Pain ? ? ? ?

## 2022-01-21 NOTE — Progress Notes (Signed)
?  ?Cardiology Office Note ? ? ?Date:  01/21/2022  ? ?ID:  [name], DOB [birth year], MRN [id number] ? ?PCP:  Tomasa Hose, NP  ?Cardiologist: Dr. Caryl Comes ? ?Chief Complaint  ?Patient presents with  ? Follow-up  ? ? ? ?  ?History of Present Illness: ?[name] is a 81 y.o. female who is here today for follow-up visit regarding peripheral arterial disease.   ?She has known history of sinus node dysfunction status post pacemaker placement in 2009, hypertrophic cardiomyopathy status post mitral valve repair and septal myomectomy, paroxysmal atrial fibrillation and hyperlipidemia.  She is a lifelong non-smoker and is not diabetic. ? ?She was seen few months ago for bilateral leg claudication worse on the right side.   ? ?She underwent recent lower extremity arterial Doppler which showed an ABI of 0.83 on the right and 1.01 on the left.  Duplex showed severe right mid SFA stenosis with peak velocity of 451.  On the left, there was moderate SFA disease. ?I instructed her to start a walking exercise program.  She did that for about 1 month with some improvement in symptoms and then she stopped.  She reports that her symptoms are still the same with no worsening.  No chest pain or shortness of breath. ? ?Past Medical History:  ?Diagnosis Date  ? Asymmetric septal hypertrophy (HCC)   ? Atrial fibrillation ([place])   ? with permanent pacemaker  ? Blepharitis 10/2016  ? Dr.Groat  ? Current use of long term anticoagulation   ? Endometriosis   ? Glaucoma   ? H/O mitral valve repair   ? Headache   ? HSV (herpes simplex virus) infection   ? Gluteal & near mouth  ? IBS (irritable bowel syndrome) 12/2002  ? Irregular heart beat   ? Melanoma ([place]) 12/06/2013  ? melanoma insitu right thigh (tx exc.)  ? Pacemaker 02/2008  ? inserted, 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  ? Rib fracture 01/2017  ? Dr.Green  ? Severe mitral regurgitation   ? ? ?Past Surgical History:  ?Procedure Laterality Date  ?  APPENDECTOMY  1952  ? HYSTEROSCOPY  06/2002  ? Polyp; Small fibroid  ? HYSTEROSCOPY  09/10/09  ? Myoma  ? MITRAL VALVE REPAIR    ?  (plication of posterior leaflet with 28-mm Medtronic CG Future  Band annuloplasty). (02/23/08  Dr. Roxy Manns)  ? MYOMECTOMY    ? median sternotomy  ? PACEMAKER INSERTION  02/2008  ? PPM GENERATOR CHANGEOUT N/A 12/19/2020  ? Procedure: PPM GENERATOR CHANGEOUT;  Surgeon: Deboraha Sprang, MD;  Location: [facility];  Service: Cardiovascular;  Laterality: N/A;  ? [place]  ? TUBAL LIGATION  1983  ? ? ? ?Current Outpatient Medications  ?Medication Sig Dispense Refill  ? acetaminophen (TYLENOL) 650 MG CR tablet Take 1,300 mg by mouth 2 (two) times daily.    ? apixaban (ELIQUIS) 2.5 MG TABS tablet Take 1 tablet (2.5 mg total) by mouth 2 (two) times daily. 180 tablet 1  ? Cholecalciferol (VITAMIN D HIGH POTENCY PO) Take 10,000 Int'l Units by mouth once a week. Ultra    ? cycloSPORINE (RESTASIS) 0.05 % ophthalmic emulsion Place 1 drop into both eyes 2 (two) times daily.    ? diclofenac Sodium (VOLTAREN) 1 % GEL Apply 1 application topically 2 (two) times daily. Knees    ? diphenhydrAMINE (BENADRYL) 50 MG capsule Take 50 mg by mouth at bedtime.     ?  dorzolamide-timolol (COSOPT) 22.3-6.8 MG/ML ophthalmic solution Place 1 drop into both eyes 2 (two) times daily.    ? Eyelid Cleansers (AVENOVA) 0.01 % SOLN Place 1 application into both eyes 2 (two) times daily.    ? fluticasone (FLONASE) 50 MCG/ACT nasal spray Place 1 spray into both nostrils at bedtime.    ? latanoprost (XALATAN) 0.005 % ophthalmic solution Place 1 drop into both eyes at bedtime.    ? loperamide (IMODIUM A-D) 2 MG tablet Take 2 mg by mouth 4 (four) times daily as needed for diarrhea or loose stools.    ? MAGNESIUM CL-CALCIUM CARBONATE PO Take 1 tablet by mouth 2 (two) times daily. 500 mg /1000 mg    ? Omega-3 Fatty Acids (FISH OIL) 1000 MG CAPS Take 2 capsules by mouth once a week. Pro Omega D    ?  Probiotic Product (PROBIOTIC DAILY PO) Take 1 tablet by mouth daily. ortho spore ig    ? rosuvastatin (CRESTOR) 10 MG tablet TAKE 1 TABLET(10 MG) BY MOUTH DAILY 90 tablet 3  ? valACYclovir (VALTREX) 500 MG tablet Take 500 mg by mouth every other day.     ? verapamil (CALAN-SR) 120 MG CR tablet Take 1 tablet (120 mg total) by mouth 2 (two) times daily. 180 tablet 2  ? ?No current facility-administered medications for this visit.  ? ? ?Allergies:   Amiodarone and Doxycycline  ? ? ?Social History:  The patient  reports that she has quit smoking. She has never used smokeless tobacco. She reports current alcohol use. She reports current drug use. Drug: Marijuana.  ? ?Family History:  The patient's family history includes Breast cancer (age of onset: 44) in her maternal grandmother; Breast cancer (age of onset: 51) in her mother; Coronary artery disease in her brother and brother; Heart attack in her father; Heart disease in her mother.  ? ? ?ROS:  Please see the history of present illness.   Otherwise, review of systems are positive for none.   All other systems are reviewed and negative.  ? ? ?PHYSICAL EXAM: ?VS:  BP 122/70   Pulse 72   Ht '4\' 11"'$  (1.499 m)   Wt 116 lb 12.8 oz (53 kg)   SpO2 95%   BMI 23.59 kg/m?  , BMI Body mass index is 23.59 kg/m?. ?GEN: Well nourished, well developed, in no acute distress  ?HEENT: normal  ?Neck: no JVD, carotid bruits, or masses ?Cardiac: RRR; no murmurs, rubs, or gallops,no edema  ?Respiratory:  clear to auscultation bilaterally, normal work of breathing ?GI: soft, nontender, nondistended, + BS ?MS: no deformity or atrophy  ?Skin: warm and dry, no rash ?Neuro:  Strength and sensation are intact ?Psych: euthymic mood, full affect ?Vascular: Femoral: +2.  Posterior tibial: +1 bilaterally.  Dorsalis pedis: Not palpable on the right but +1 on the left. ? ? ?EKG:  EKG is ordered today. ?Atrial paced rhythm with left bundle branch block. ? ? ?Recent Labs: ?08/30/2021: ALT 17   ? ? ?Lipid Panel ?   ?Component Value Date/Time  ? CHOL 145 08/30/2021 0844  ? TRIG 153 (H) 08/30/2021 0844  ? HDL 72 08/30/2021 0844  ? CHOLHDL 2.0 08/30/2021 0844  ? CHOLHDL 2.7 CALC 08/10/2007 0840  ? VLDL 24 08/10/2007 0840  ? [place] 48 08/30/2021 0844  ? LDLDIRECT 135.1 04/05/2007 0849  ? ?  ? ?Wt Readings from Last 3 Encounters:  ?01/21/22 116 lb 12.8 oz (53 kg)  ?09/05/21 119 lb (54 kg)  ?05/28/21 117  lb 6.4 oz (53.3 kg)  ?  ? ? ? ? ?  03/19/2017  ? 10:38 AM  ?PAD Screen  ?Previous PAD dx? No  ?Previous surgical procedure? No  ?Pain with walking? No  ?Feet/toe relief with dangling? Yes  ?Painful, non-healing ulcers? No  ?Extremities discolored? No  ? ? ? ? ?ASSESSMENT AND PLAN: ? ?1.  Peripheral arterial disease with intermittent claudication mostly affecting the right: This is due to significant stenosis in the right SFA.  There is moderate SFA disease on the left side. ?Her symptoms are currently not lifestyle limiting.  I recommend resuming the walking exercise program and provided her with written instructions.  Angiography and revascularization can be considered if symptoms worsen. ? ?2.  Hyperlipidemia: She is tolerating rosuvastatin 10 mg daily.  Most recent lipid profile showed an LDL of 48. ? ?3.  Paroxysmal atrial fibrillation: She seems to be in sinus rhythm.  She is on anticoagulation with Eliquis. ? ?4.  History of hypertrophic cardiomyopathy status post myomectomy and mitral valve repair. ? ? ? ?Disposition:   FU with me in 6 months ? ?Signed, ? ?Kathlyn Sacramento, MD  ?01/21/2022 10:46 AM    ?[place] ? ?

## 2022-02-11 ENCOUNTER — Encounter: Payer: Self-pay | Admitting: Internal Medicine

## 2022-02-11 ENCOUNTER — Ambulatory Visit (INDEPENDENT_AMBULATORY_CARE_PROVIDER_SITE_OTHER): Payer: PPO | Admitting: Internal Medicine

## 2022-02-11 VITALS — BP 114/68 | HR 70 | Ht 59.0 in | Wt 118.8 lb

## 2022-02-11 DIAGNOSIS — I421 Obstructive hypertrophic cardiomyopathy: Secondary | ICD-10-CM | POA: Diagnosis not present

## 2022-02-11 DIAGNOSIS — Z950 Presence of cardiac pacemaker: Secondary | ICD-10-CM

## 2022-02-11 DIAGNOSIS — I495 Sick sinus syndrome: Secondary | ICD-10-CM | POA: Diagnosis not present

## 2022-02-11 DIAGNOSIS — I480 Paroxysmal atrial fibrillation: Secondary | ICD-10-CM

## 2022-02-11 NOTE — Patient Instructions (Signed)
Medication Instructions:  Your physician recommends that you continue on your current medications as directed. Please refer to the Current Medication list given to you today.  *If you need a refill on your cardiac medications before your next appointment, please call your pharmacy*   Lab Work: None ordered.  If you have labs (blood work) drawn today and your tests are completely normal, you will receive your results only by: MyChart Message (if you have MyChart) OR A paper copy in the mail If you have any lab test that is abnormal or we need to change your treatment, we will call you to review the results.   Testing/Procedures: None ordered.    Follow-Up: At [facility], you and your health needs are our priority.  As part of our continuing mission to provide you with exceptional heart care, we have created designated Provider Care Teams.  These Care Teams include your primary Cardiologist (physician) and Advanced Practice Providers (APPs -  Physician Assistants and Nurse Practitioners) who all work together to provide you with the care you need, when you need it.  We recommend signing up for the patient portal called "MyChart".  Sign up information is provided on this After Visit Summary.  MyChart is used to connect with patients for Virtual Visits (Telemedicine).  Patients are able to view lab/test results, encounter notes, upcoming appointments, etc.  Non-urgent messages can be sent to your provider as well.   To learn more about what you can do with MyChart, go to [email or web address].    Your next appointment:    6 months with Dr Klein   Important Information About Sugar       

## 2022-02-11 NOTE — Progress Notes (Signed)
Electrophysiology Office Note   Date:  02/11/2022   ID:  Jennifer, [name] 10-31-1940, MRN [id number]  Location: patient's home  Provider location: [address], [place]  Evaluation Performed: Follow-up visit  PCP:  Tomasa Hose, NP  Cardiologist:     Electrophysiologist:  SK   Chief Complaint:  Sinus node dysfunction  History of Present Illness:    [name] is a 81 y.o. female who presents for follow-up for [facility] pacemaker implanted originally 2009 with generator change in 4/22.  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  Device is programmed AAIR     When seen last she had issues of claudication; ABIs were abnormal.  She saw Dr. Gaylyn Cheers.  Conservative therapy is being pursued \ The patient denies chest pain, shortness of breath, nocturnal dyspnea, orthopnea or peripheral edema.  There have been no palpitations, lightheadedness or syncope.        Date Cr  K Hgb Mg  6/18 0.78 4.0  2.0  11/20  0.98 4.4     4/22 0.73 4.6    8/22 0.71 4.6    4/23 0.74 4.8 14.3     DATE TEST EF    10/15 Echo  55-60 % Mod AI PHT 390  10/17 Echo  55-60 % Mod AI  PHT 454  10/20 Echo  55-65% Mild-Mod AI PHT 398     Past Medical History:  Diagnosis Date   Asymmetric septal hypertrophy (HCC)    Atrial fibrillation (HCC)    with permanent pacemaker   Blepharitis 10/2016   Dr.Groat   Current use of long term anticoagulation    Endometriosis    Glaucoma    H/O mitral valve repair    Headache    HSV (herpes simplex virus) infection    Gluteal & near mouth   IBS (irritable bowel syndrome) 12/2002   Irregular heart beat    Melanoma ([place]) 12/06/2013   melanoma insitu right thigh (tx exc.)   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, 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   Rib fracture 01/2017    Dr.Green   Severe mitral regurgitation     Past Surgical History:  Procedure Laterality Date   APPENDECTOMY  1952   HYSTEROSCOPY  06/2002   Polyp; Small fibroid   HYSTEROSCOPY  09/10/09   Myoma   MITRAL VALVE REPAIR      (plication of posterior leaflet with 28-mm Medtronic CG Future  Band annuloplasty). (02/23/08  Dr. Roxy Manns)   MYOMECTOMY     median sternotomy   PACEMAKER INSERTION  02/2008   PPM GENERATOR CHANGEOUT N/A 12/19/2020   Procedure: PPM GENERATOR CHANGEOUT;  Surgeon: Deboraha Sprang, MD;  Location: [facility];  Service: Cardiovascular;  Laterality: N/A;   TONSILLECTOMY AND ADENOIDECTOMY  1955   TUBAL LIGATION  1983    Current Outpatient Medications  Medication Sig Dispense Refill   acetaminophen (TYLENOL) 650 MG CR tablet Take 1,300 mg by mouth 2 (two) times daily.     apixaban (ELIQUIS) 2.5 MG TABS tablet Take 1 tablet (2.5 mg total) by mouth 2 (two) times daily. 180 tablet 1   Cholecalciferol (VITAMIN D HIGH POTENCY PO) Take 10,000 Int'l Units by mouth once a week. Ultra     cycloSPORINE (RESTASIS) 0.05 % ophthalmic emulsion Place 1 drop into both eyes 2 (two) times daily.  diclofenac Sodium (VOLTAREN) 1 % GEL Apply 1 application topically 2 (two) times daily. Knees     diphenhydrAMINE (BENADRYL) 50 MG capsule Take 50 mg by mouth at bedtime.      dorzolamide-timolol (COSOPT) 22.3-6.8 MG/ML ophthalmic solution Place 1 drop into both eyes 2 (two) times daily.     Eyelid Cleansers (AVENOVA) 0.01 % SOLN Place 1 application into both eyes 2 (two) times daily.     fluticasone (FLONASE) 50 MCG/ACT nasal spray Place 1 spray into both nostrils at bedtime.     latanoprost (XALATAN) 0.005 % ophthalmic solution Place 1 drop into both eyes at bedtime.     loperamide (IMODIUM A-D) 2 MG tablet Take 2 mg by mouth 4 (four) times daily as needed for diarrhea or loose stools.     MAGNESIUM CL-CALCIUM CARBONATE PO Take 1 tablet by mouth 2 (two) times daily. 500 mg /1000 mg      Omega-3 Fatty Acids (FISH OIL) 1000 MG CAPS Take 2 capsules by mouth once a week. Pro Omega D     Probiotic Product (PROBIOTIC DAILY PO) Take 1 tablet by mouth daily. ortho spore ig     rosuvastatin (CRESTOR) 10 MG tablet TAKE 1 TABLET(10 MG) BY MOUTH DAILY 90 tablet 3   valACYclovir (VALTREX) 500 MG tablet Take 500 mg by mouth every other day.      verapamil (CALAN-SR) 120 MG CR tablet Take 1 tablet (120 mg total) by mouth 2 (two) times daily. 180 tablet 2   No current facility-administered medications for this visit.    Allergies:   Amiodarone and Doxycycline      Exam:    Vital Signs:  BP 114/68   Pulse 70   Ht '4\' 11"'$  (1.499 m)   Wt 118 lb 12.8 oz (53.9 kg)   BMI 23.99 kg/m    Well developed and well nourished in no acute distress HENT normal Neck supple with JVP-flat Clear Device pocket well healed; without hematoma or erythema.  There is no tethering  Regular rate and rhythm, no  murmur Abd-soft with active BS No Clubbing cyanosis A edema Skin-warm and dry A & Oriented  Grossly normal sensory and motor function  ECG aA pacing '@70'$  20/13/45 LBBB       ASSESSMENT & PLAN:   Hypertrophic cardiomyopathy status post myectomy  Family  issues have been addressed   Aortic insufficiency-moderate with normal left ventricular size   Paroxysmal atrial fibrillation    Pacemaker-St. Jude     PVCs and PACs    Claudication  Pt heart failure status is stable.   Palpitations quiescient   Anticoagulation with apixoban; No bleeding  continue 2.5 mg bid  Multiple questions about the role of statin therapy and its impact on cognitive issues and its role in secondary prevention given her peripheral vascular disease.  Reviewed up-to-date together.  She will continue her low-dose hydrophilic statin--rosuvastatin 10  LDL 489 12/22         Signed, [name], MD  02/11/2022 5:37 PM     [address] [place]  [phone] (office) [phone] (fax)

## 2022-03-24 ENCOUNTER — Ambulatory Visit (INDEPENDENT_AMBULATORY_CARE_PROVIDER_SITE_OTHER): Payer: PPO

## 2022-03-24 DIAGNOSIS — I480 Paroxysmal atrial fibrillation: Secondary | ICD-10-CM | POA: Diagnosis not present

## 2022-03-25 LAB — CUP PACEART REMOTE DEVICE CHECK
Battery Remaining Longevity: 107 mo
Battery Remaining Percentage: 92 %
Battery Voltage: 3.02 V
Brady Statistic RA Percent Paced: 97 %
Date Time Interrogation Session: 20230710020013
Implantable Lead Implant Date: 20090617
Implantable Lead Implant Date: 20090617
Implantable Lead Location: 753859
Implantable Lead Location: 753860
Implantable Pulse Generator Implant Date: 20220406
Lead Channel Impedance Value: 400 Ohm
Lead Channel Pacing Threshold Amplitude: 0.75 V
Lead Channel Pacing Threshold Pulse Width: 0.4 ms
Lead Channel Sensing Intrinsic Amplitude: 5 mV
Lead Channel Setting Pacing Amplitude: 2 V
Pulse Gen Model: 2272
Pulse Gen Serial Number: 3911946

## 2022-04-14 NOTE — Progress Notes (Signed)
Remote pacemaker transmission.   

## 2022-06-23 ENCOUNTER — Ambulatory Visit (INDEPENDENT_AMBULATORY_CARE_PROVIDER_SITE_OTHER): Payer: PPO

## 2022-06-23 DIAGNOSIS — I495 Sick sinus syndrome: Secondary | ICD-10-CM

## 2022-06-25 LAB — CUP PACEART REMOTE DEVICE CHECK
Battery Remaining Longevity: 104 mo
Battery Remaining Percentage: 90 %
Battery Voltage: 3.02 V
Brady Statistic RA Percent Paced: 96 %
Date Time Interrogation Session: 20231010224834
Implantable Lead Implant Date: 20090617
Implantable Lead Implant Date: 20090617
Implantable Lead Location: 753859
Implantable Lead Location: 753860
Implantable Pulse Generator Implant Date: 20220406
Lead Channel Impedance Value: 390 Ohm
Lead Channel Pacing Threshold Amplitude: 0.75 V
Lead Channel Pacing Threshold Pulse Width: 0.4 ms
Lead Channel Sensing Intrinsic Amplitude: 5 mV
Lead Channel Setting Pacing Amplitude: 2 V
Pulse Gen Model: 2272
Pulse Gen Serial Number: 3911946

## 2022-07-09 NOTE — Progress Notes (Signed)
Remote pacemaker transmission.   

## 2022-07-14 ENCOUNTER — Other Ambulatory Visit: Payer: Self-pay

## 2022-07-14 MED ORDER — VERAPAMIL HCL ER 120 MG PO TBCR: 120.0000 mg | EXTENDED_RELEASE_TABLET | Freq: Two times a day (BID) | ORAL | 2 refills | Status: DC

## 2022-08-05 ENCOUNTER — Encounter: Payer: Self-pay | Admitting: Cardiovascular Disease

## 2022-08-05 ENCOUNTER — Ambulatory Visit: Payer: PPO | Attending: Cardiovascular Disease | Admitting: Cardiovascular Disease

## 2022-08-05 VITALS — BP 124/60 | HR 85 | Ht 59.0 in | Wt 119.4 lb

## 2022-08-05 DIAGNOSIS — E785 Hyperlipidemia, unspecified: Secondary | ICD-10-CM | POA: Diagnosis not present

## 2022-08-05 DIAGNOSIS — Z8679 Personal history of other diseases of the circulatory system: Secondary | ICD-10-CM

## 2022-08-05 DIAGNOSIS — I480 Paroxysmal atrial fibrillation: Secondary | ICD-10-CM

## 2022-08-05 DIAGNOSIS — I739 Peripheral vascular disease, unspecified: Secondary | ICD-10-CM | POA: Diagnosis not present

## 2022-08-05 NOTE — Patient Instructions (Signed)
Medication Instructions:  No changes *If you need a refill on your cardiac medications before your next appointment, please call your pharmacy*   Lab Work: None ordered If you have labs (blood work) drawn today and your tests are completely normal, you will receive your results only by: MyChart Message (if you have MyChart) OR A paper copy in the mail If you have any lab test that is abnormal or we need to change your treatment, we will call you to review the results.   Testing/Procedures: None ordered   Follow-Up: At [facility], you and your health needs are our priority.  As part of our continuing mission to provide you with exceptional heart care, we have created designated Provider Care Teams.  These Care Teams include your primary Cardiologist (physician) and Advanced Practice Providers (APPs -  Physician Assistants and Nurse Practitioners) who all work together to provide you with the care you need, when you need it.  We recommend signing up for the patient portal called "MyChart".  Sign up information is provided on this After Visit Summary.  MyChart is used to connect with patients for Virtual Visits (Telemedicine).  Patients are able to view lab/test results, encounter notes, upcoming appointments, etc.  Non-urgent messages can be sent to your provider as well.   To learn more about what you can do with MyChart, go to [email or web address].    Your next appointment:   12 month(s)  The format for your next appointment:   In Person  Provider:   Dr. Arida  Important Information About Sugar       

## 2022-08-05 NOTE — Progress Notes (Signed)
Cardiology Office Note   Date:  08/05/2022   ID:  [name], DOB 1940/10/27, MRN [id number]  PCP:  Tomasa Hose, NP  Cardiologist: Dr. Caryl Comes  No chief complaint on file.      History of Present Illness: [name] is a 81 y.o. female who is here today for follow-up visit regarding peripheral arterial disease.   She has known history of sinus node dysfunction status post pacemaker placement in 2009, hypertrophic cardiomyopathy status post mitral valve repair and septal myomectomy, paroxysmal atrial fibrillation and hyperlipidemia.  She is a lifelong non-smoker and is not diabetic.  She was seen in early 2023 for bilateral leg claudication worse on the right side.    She underwent recent lower extremity arterial Doppler which showed an ABI of 0.83 on the right and 1.01 on the left.  Duplex showed severe right mid SFA stenosis with peak velocity of 451.  On the left, there was moderate SFA disease.  She started an exercise program and reports improvement in symptoms.  She does not feel limited by her right calf claudication.  No rest pain or lower extremity ulceration.  No recent arrhythmia.  Past Medical History:  Diagnosis Date   Asymmetric septal hypertrophy (HCC)    Atrial fibrillation ([place])    with permanent pacemaker   Blepharitis 10/2016   Dr.Groat   Current use of long term anticoagulation    Endometriosis    Glaucoma    H/O mitral valve repair    Headache    HSV (herpes simplex virus) infection    Gluteal & near mouth   IBS (irritable bowel syndrome) 12/2002   Irregular heart beat    Melanoma ([place]) 12/06/2013   melanoma insitu right thigh (tx exc.)   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, 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   Rib fracture 01/2017   Dr.Green   Severe mitral regurgitation     Past Surgical History:  Procedure Laterality Date   APPENDECTOMY  1952   HYSTEROSCOPY  06/2002   Polyp; Small fibroid   HYSTEROSCOPY   09/10/09   Myoma   MITRAL VALVE REPAIR      (plication of posterior leaflet with 28-mm Medtronic CG Future  Band annuloplasty). (02/23/08  Dr. Roxy Manns)   MYOMECTOMY     median sternotomy   PACEMAKER INSERTION  02/2008   PPM GENERATOR CHANGEOUT N/A 12/19/2020   Procedure: PPM GENERATOR CHANGEOUT;  Surgeon: Deboraha Sprang, MD;  Location: [facility];  Service: Cardiovascular;  Laterality: N/A;   TONSILLECTOMY AND ADENOIDECTOMY  1955   TUBAL LIGATION  1983     Current Outpatient Medications  Medication Sig Dispense Refill   acetaminophen (TYLENOL) 650 MG CR tablet Take 1,300 mg by mouth 2 (two) times daily.     apixaban (ELIQUIS) 2.5 MG TABS tablet Take 1 tablet (2.5 mg total) by mouth 2 (two) times daily. 180 tablet 1   Cholecalciferol (VITAMIN D HIGH POTENCY PO) Take 10,000 Int'l Units by mouth once a week. Ultra     cycloSPORINE (RESTASIS) 0.05 % ophthalmic emulsion Place 1 drop into both eyes 2 (two) times daily.     diclofenac Sodium (VOLTAREN) 1 % GEL Apply 1 application topically 2 (two) times daily. Knees     diphenhydrAMINE (BENADRYL) 50 MG capsule Take 50 mg by mouth at bedtime.      dorzolamide-timolol (COSOPT) 22.3-6.8 MG/ML ophthalmic solution Place 1 drop into both eyes 2 (two) times daily.  Eyelid Cleansers (AVENOVA) 0.01 % SOLN Place 1 application into both eyes 2 (two) times daily.     fluticasone (FLONASE) 50 MCG/ACT nasal spray Place 1 spray into both nostrils at bedtime.     latanoprost (XALATAN) 0.005 % ophthalmic solution Place 1 drop into both eyes at bedtime.     loperamide (IMODIUM A-D) 2 MG tablet Take 2 mg by mouth 4 (four) times daily as needed for diarrhea or loose stools.     MAGNESIUM CL-CALCIUM CARBONATE PO Take 1 tablet by mouth 2 (two) times daily. 500 mg /1000 mg     Omega-3 Fatty Acids (FISH OIL) 1000 MG CAPS Take 2 capsules by mouth once a week. Pro Omega D     Probiotic Product (PROBIOTIC DAILY PO) Take 1 tablet by mouth daily. ortho spore ig      rosuvastatin (CRESTOR) 10 MG tablet TAKE 1 TABLET(10 MG) BY MOUTH DAILY 90 tablet 3   valACYclovir (VALTREX) 500 MG tablet Take 500 mg by mouth every other day.      verapamil (CALAN-SR) 120 MG CR tablet Take 1 tablet (120 mg total) by mouth 2 (two) times daily. 180 tablet 2   No current facility-administered medications for this visit.    Allergies:   Amiodarone and Doxycycline    Social History:  The patient  reports that she has quit smoking. She has never used smokeless tobacco. She reports current alcohol use. She reports current drug use. Drug: Marijuana.   Family History:  The patient's family history includes Breast cancer (age of onset: 54) in her maternal grandmother; Breast cancer (age of onset: 80) in her mother; Coronary artery disease in her brother and brother; Heart attack in her father; Heart disease in her mother.    ROS:  Please see the history of present illness.   Otherwise, review of systems are positive for none.   All other systems are reviewed and negative.    PHYSICAL EXAM: VS:  BP 124/60 (BP Location: Left Arm, Patient Position: Sitting, Cuff Size: Normal)   Pulse 85   Ht '4\' 11"'$  (1.499 m)   Wt 119 lb 6.4 oz (54.2 kg)   SpO2 97%   BMI 24.12 kg/m  , BMI Body mass index is 24.12 kg/m. GEN: Well nourished, well developed, in no acute distress  HEENT: normal  Neck: no JVD, carotid bruits, or masses Cardiac: RRR; no murmurs, rubs, or gallops,no edema  Respiratory:  clear to auscultation bilaterally, normal work of breathing GI: soft, nontender, nondistended, + BS MS: no deformity or atrophy  Skin: warm and dry, no rash Neuro:  Strength and sensation are intact Psych: euthymic mood, full affect Vascular: Femoral: +2.  Posterior tibial: +1 bilaterally.  Dorsalis pedis: Not palpable on the right but +1 on the left.   EKG:  EKG is ordered today. Atrial paced rhythm with left bundle branch block.   Recent Labs: 08/30/2021: ALT 17    Lipid Panel     Component Value Date/Time   CHOL 145 08/30/2021 0844   TRIG 153 (H) 08/30/2021 0844   HDL 72 08/30/2021 0844   CHOLHDL 2.0 08/30/2021 0844   CHOLHDL 2.7 CALC 08/10/2007 0840   VLDL 24 08/10/2007 0840   LDLCALC 48 08/30/2021 0844   LDLDIRECT 135.1 04/05/2007 0849      Wt Readings from Last 3 Encounters:  08/05/22 119 lb 6.4 oz (54.2 kg)  02/11/22 118 lb 12.8 oz (53.9 kg)  01/21/22 116 lb 12.8 oz (53 kg)  03/19/2017   10:38 AM  PAD Screen  Previous PAD dx? No  Previous surgical procedure? No  Pain with walking? No  Feet/toe relief with dangling? Yes  Painful, non-healing ulcers? No  Extremities discolored? No      ASSESSMENT AND PLAN:  1.  Peripheral arterial disease with intermittent claudication mostly affecting the right calf: This is due to significant stenosis in the right SFA.  There is moderate SFA disease on the left side. Her symptoms improved with an exercise program and currently her symptoms are not lifestyle limiting.  Continue medical therapy.  2.  Hyperlipidemia: She is tolerating rosuvastatin 10 mg daily.  Most recent lipid profile showed an LDL of 48.  3.  Paroxysmal atrial fibrillation: She seems to be in sinus rhythm.  She is on anticoagulation with Eliquis.  4.  History of hypertrophic cardiomyopathy status post myomectomy and mitral valve repair.    Disposition:   FU with me in 12 months  Signed,  Kathlyn Sacramento, MD  08/05/2022 1:35 PM    [place]

## 2022-09-09 ENCOUNTER — Encounter: Payer: Self-pay | Admitting: Internal Medicine

## 2022-09-09 ENCOUNTER — Ambulatory Visit: Payer: PPO | Attending: Internal Medicine | Admitting: Internal Medicine

## 2022-09-09 VITALS — BP 132/68 | HR 70 | Ht 59.0 in | Wt 121.6 lb

## 2022-09-09 DIAGNOSIS — I495 Sick sinus syndrome: Secondary | ICD-10-CM

## 2022-09-09 DIAGNOSIS — I480 Paroxysmal atrial fibrillation: Secondary | ICD-10-CM

## 2022-09-09 DIAGNOSIS — Z950 Presence of cardiac pacemaker: Secondary | ICD-10-CM | POA: Diagnosis not present

## 2022-09-09 DIAGNOSIS — I421 Obstructive hypertrophic cardiomyopathy: Secondary | ICD-10-CM

## 2022-09-09 NOTE — Patient Instructions (Signed)
Medication Instructions:  The current medical regimen is effective;  continue present plan and medications.  *If you need a refill on your cardiac medications before your next appointment, please call your pharmacy*  Follow-Up: At [facility], you and your health needs are our priority.  As part of our continuing mission to provide you with exceptional heart care, we have created designated Provider Care Teams.  These Care Teams include your primary Cardiologist (physician) and Advanced Practice Providers (APPs -  Physician Assistants and Nurse Practitioners) who all work together to provide you with the care you need, when you need it.  We recommend signing up for the patient portal called "MyChart".  Sign up information is provided on this After Visit Summary.  MyChart is used to connect with patients for Virtual Visits (Telemedicine).  Patients are able to view lab/test results, encounter notes, upcoming appointments, etc.  Non-urgent messages can be sent to your provider as well.   To learn more about what you can do with MyChart, go to [email or web address].    Your next appointment:   1 year(s)  The format for your next appointment:   In Person  Provider:   Dr Caryl Comes      Important Information About Sugar

## 2022-09-09 NOTE — Progress Notes (Signed)
Electrophysiology Office Note   Date:  09/09/2022   ID:  [name], DOB 1941/06/15, MRN [id number]  Location: patient's home  Provider location: [address], [place]  Evaluation Performed: Follow-up visit  PCP:  Tomasa Hose, NP  Cardiologist:     Electrophysiologist:  SK   Chief Complaint:  Sinus node dysfunction  History of Present Illness:    [name] is a 81 y.o. female who presents for follow-up for [facility] pacemaker implanted originally 2009 with generator change in 4/22.  Her sinus node dysfunction manifested following mitral valve repair and septal myectomy for underlying hypertrophic cardiomyopathy with asymmetric septal hypertrophy, paroxysmal atrial fibrillation on anticoagulation w Apixaban dosed for age and weight.  Device is programmed AAIR     When seen last she had issues of claudication; ABIs were abnormal.  She saw Dr. Gaylyn Cheers.  Conservative therapy is being pursued and she recently assessed as stable  The patient denies chest pain, nocturnal dyspnea, orthopnea or peripheral edema.  There have been no palpitations, lightheadedness or syncope.  Mild shortness of breath with exertion.  She is doing more aerobics and loving it.  She has decided not to walk out in the [place] ticks [place] anymore  .    Date Cr  K Hgb Mg  6/18 0.78 4.0  2.0  11/20  0.98 4.4     4/22 0.73 4.6    8/22 0.71 4.6    4/23 0.74 4.8 14.3     DATE TEST EF    10/15 Echo  55-60 % Mod AI PHT 390  10/17 Echo  55-60 % Mod AI  PHT 454  10/20 Echo  55-65% Mild-Mod AI PHT 398     Past Medical History:  Diagnosis Date   Asymmetric septal hypertrophy (HCC)    Atrial fibrillation (HCC)    with permanent pacemaker   Blepharitis 10/2016   Dr.Groat   Current use of long term anticoagulation    Endometriosis    Glaucoma    H/O mitral valve repair    Headache    HSV (herpes simplex virus) infection    Gluteal & near mouth   IBS (irritable bowel syndrome)  12/2002   Irregular heart beat    Melanoma ([place]) 12/06/2013   melanoma insitu right thigh (tx exc.)   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, 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   Rib fracture 01/2017   Dr.Green   Severe mitral regurgitation     Past Surgical History:  Procedure Laterality Date   APPENDECTOMY  1952   HYSTEROSCOPY  06/2002   Polyp; Small fibroid   HYSTEROSCOPY  09/10/09   Myoma   MITRAL VALVE REPAIR      (plication of posterior leaflet with 28-mm Medtronic CG Future  Band annuloplasty). (02/23/08  Dr. Roxy Manns)   MYOMECTOMY     median sternotomy   PACEMAKER INSERTION  02/2008   PPM GENERATOR CHANGEOUT N/A 12/19/2020   Procedure: PPM GENERATOR CHANGEOUT;  Surgeon: Deboraha Sprang, MD;  Location: [facility];  Service: Cardiovascular;  Laterality: N/A;   TONSILLECTOMY AND ADENOIDECTOMY  1955   TUBAL LIGATION  1983    Current Outpatient Medications  Medication Sig Dispense Refill   acetaminophen (TYLENOL) 650 MG CR tablet Take 1,300 mg by mouth 2 (two) times daily.     apixaban (ELIQUIS) 2.5 MG TABS tablet Take 1 tablet (2.5 mg total) by mouth 2 (two) times  daily. 180 tablet 1   Cholecalciferol (VITAMIN D HIGH POTENCY PO) Take 10,000 Int'l Units by mouth once a week. Ultra     cycloSPORINE (RESTASIS) 0.05 % ophthalmic emulsion Place 1 drop into both eyes 2 (two) times daily.     diclofenac Sodium (VOLTAREN) 1 % GEL Apply 1 application topically 2 (two) times daily. Knees     diphenhydrAMINE (BENADRYL) 50 MG capsule Take 50 mg by mouth at bedtime.      dorzolamide-timolol (COSOPT) 22.3-6.8 MG/ML ophthalmic solution Place 1 drop into both eyes 2 (two) times daily.     Eyelid Cleansers (AVENOVA) 0.01 % SOLN Place 1 application into both eyes 2 (two) times daily.     fluticasone (FLONASE) 50 MCG/ACT nasal spray Place 1 spray into both nostrils at bedtime.     latanoprost (XALATAN) 0.005 % ophthalmic solution Place 1 drop into both eyes at bedtime.      loperamide (IMODIUM A-D) 2 MG tablet Take 2 mg by mouth 4 (four) times daily as needed for diarrhea or loose stools.     MAGNESIUM CL-CALCIUM CARBONATE PO Take 1 tablet by mouth 2 (two) times daily. 500 mg /1000 mg     Omega-3 Fatty Acids (FISH OIL) 1000 MG CAPS Take 2 capsules by mouth once a week. Pro Omega D     Probiotic Product (PROBIOTIC DAILY PO) Take 1 tablet by mouth daily. ortho spore ig     rosuvastatin (CRESTOR) 10 MG tablet TAKE 1 TABLET(10 MG) BY MOUTH DAILY 90 tablet 3   valACYclovir (VALTREX) 500 MG tablet Take 500 mg by mouth every other day.      verapamil (CALAN-SR) 120 MG CR tablet Take 1 tablet (120 mg total) by mouth 2 (two) times daily. 180 tablet 2   No current facility-administered medications for this visit.    Allergies:   Amiodarone and Doxycycline      Exam:    Vital Signs:  BP 132/68   Pulse 70   Ht '4\' 11"'$  (1.499 m)   Wt 121 lb 9.6 oz (55.2 kg)   SpO2 97%   BMI 24.56 kg/m    Well developed and well nourished in no acute distress HENT normal Neck supple with JVP-flat Clear Device pocket well healed; without hematoma or erythema.  There is no tethering  Regular rate and rhythm, no gallop No / murmur Abd-soft with active BS No Clubbing cyanosis  edema Skin-warm and dry A & Oriented  Grossly normal sensory and motor function  ECG atrial pacing at 70 Interval 21/13/45 Atypical left bundle branch block  Device function is normal. Programming changes  See Paceart for details    Discussed the possibility of working on rate response to improve exercise capacity     ASSESSMENT & PLAN:   Hypertrophic cardiomyopathy status post myectomy  Family  issues have been addressed   Aortic insufficiency-moderate with normal left ventricular size   Paroxysmal atrial fibrillation    Pacemaker-[employer]. [employer]     PVCs and PACs    Elevated blood pressure   No significant interval atrial fibrillation.  No bleeding continue the Eliquis at 2.5 mg twice daily.   With her HCM, and her elevated blood pressure we will continue her on verapamil 120  Sinus node dysfunction almost atrially dependent.  As noted above discussed the possibility of working on her rate response algorithm but she would like to defer for now.       Signed, [name], MD  09/09/2022 4:56 PM  [place] [address] [place] [address] [phone] (office) [phone] (fax)

## 2022-09-22 ENCOUNTER — Ambulatory Visit (INDEPENDENT_AMBULATORY_CARE_PROVIDER_SITE_OTHER): Payer: PPO

## 2022-09-22 DIAGNOSIS — I495 Sick sinus syndrome: Secondary | ICD-10-CM | POA: Diagnosis not present

## 2022-09-23 LAB — CUP PACEART REMOTE DEVICE CHECK
Battery Remaining Longevity: 103 mo
Battery Remaining Percentage: 88 %
Battery Voltage: 3.02 V
Brady Statistic RA Percent Paced: 96 %
Date Time Interrogation Session: 20240108020014
Implantable Lead Connection Status: 753985
Implantable Lead Connection Status: 753985
Implantable Lead Implant Date: 20090617
Implantable Lead Implant Date: 20090617
Implantable Lead Location: 753859
Implantable Lead Location: 753860
Implantable Pulse Generator Implant Date: 20220406
Lead Channel Impedance Value: 400 Ohm
Lead Channel Pacing Threshold Amplitude: 0.75 V
Lead Channel Pacing Threshold Pulse Width: 0.4 ms
Lead Channel Sensing Intrinsic Amplitude: 5 mV
Lead Channel Setting Pacing Amplitude: 2 V
Pulse Gen Model: 2272
Pulse Gen Serial Number: 3911946

## 2022-09-30 ENCOUNTER — Ambulatory Visit: Payer: PPO | Admitting: Dermatology

## 2022-11-04 NOTE — Progress Notes (Signed)
Remote pacemaker transmission.   

## 2022-11-27 ENCOUNTER — Encounter: Payer: Self-pay | Admitting: Internal Medicine

## 2022-12-04 NOTE — Telephone Encounter (Signed)
Ladies Called to speak to patient; interesting, and as exactly outlined in her notes, I wonder if we can improve things by changing the threshold of rate response   On epic I can not find her RATE RESPONSE program>>  I have asked brian to see whether he can find it on [facility] but would like to plan to lower the threshold >> if can get the current program from [place], I will give you a value and she can come in, o/w maybe we can program her with andy or Renee or with device clinic when I am back  Thanks SK

## 2022-12-05 NOTE — Telephone Encounter (Signed)
Device clinic apt 12/25/22 @ 2:40.

## 2022-12-22 ENCOUNTER — Ambulatory Visit (INDEPENDENT_AMBULATORY_CARE_PROVIDER_SITE_OTHER): Payer: PPO

## 2022-12-22 DIAGNOSIS — I495 Sick sinus syndrome: Secondary | ICD-10-CM

## 2022-12-23 LAB — CUP PACEART REMOTE DEVICE CHECK
Battery Remaining Longevity: 99 mo
Battery Remaining Percentage: 86 %
Battery Voltage: 3.02 V
Brady Statistic RA Percent Paced: 96 %
Date Time Interrogation Session: 20240408020012
Implantable Lead Connection Status: 753985
Implantable Lead Connection Status: 753985
Implantable Lead Implant Date: 20090617
Implantable Lead Implant Date: 20090617
Implantable Lead Location: 753859
Implantable Lead Location: 753860
Implantable Pulse Generator Implant Date: 20220406
Lead Channel Impedance Value: 390 Ohm
Lead Channel Pacing Threshold Amplitude: 0.75 V
Lead Channel Pacing Threshold Pulse Width: 0.4 ms
Lead Channel Sensing Intrinsic Amplitude: 5 mV
Lead Channel Setting Pacing Amplitude: 2 V
Pulse Gen Model: 2272
Pulse Gen Serial Number: 3911946

## 2022-12-25 ENCOUNTER — Ambulatory Visit: Payer: PPO | Attending: Cardiovascular Disease

## 2022-12-25 DIAGNOSIS — I495 Sick sinus syndrome: Secondary | ICD-10-CM

## 2022-12-25 LAB — CUP PACEART INCLINIC DEVICE CHECK
Battery Remaining Longevity: 96 mo
Battery Voltage: 3.02 V
Brady Statistic RA Percent Paced: 96 %
Brady Statistic RV Percent Paced: 0 %
Date Time Interrogation Session: 20240411173351
Implantable Lead Connection Status: 753985
Implantable Lead Connection Status: 753985
Implantable Lead Implant Date: 20090617
Implantable Lead Implant Date: 20090617
Implantable Lead Location: 753859
Implantable Lead Location: 753860
Implantable Pulse Generator Implant Date: 20220406
Lead Channel Impedance Value: 412.5 Ohm
Lead Channel Impedance Value: 450 Ohm
Lead Channel Pacing Threshold Amplitude: 0.75 V
Lead Channel Pacing Threshold Amplitude: 0.75 V
Lead Channel Pacing Threshold Pulse Width: 0.4 ms
Lead Channel Pacing Threshold Pulse Width: 0.4 ms
Lead Channel Sensing Intrinsic Amplitude: 5 mV
Lead Channel Setting Pacing Amplitude: 2 V
Pulse Gen Model: 2272
Pulse Gen Serial Number: 3911946

## 2022-12-25 NOTE — Patient Instructions (Signed)
We have made a minor adjustment to your device today which should help with the exertional shortness of breath that you were experiencing.   If you have any questions or concerns please contact us at [phone].   Thank you for your visit today.

## 2022-12-25 NOTE — Progress Notes (Signed)
Patient brought in today per Dr. Odessa Fleming request to evaluate rate response settings due to patient having SOB and difficulty when going from sitting to an active level.  Once active, she compensates and feels fine, but has trouble "ramping" up.  Reviewed with Judie Grieve from industry and Dr. Graciela Husbands.   Initial setting changes: 1. Slope initial increase from 10-12 2.  Reaction from Medium to Fast Caused patient to not feel well with pounding heart rates when standing.   Adjustments made with Dr. Graciela Husbands: 1.  Slope decreased from 12-11 2.  Reaction turned back to medium.  Patient feeling better with these changes and will continue to monitor and call us back if any further concerns.   *Note:  when increasing LRL to 100-110 patient does have 1:1 AP/VS conduction; however, when increasing LRL to 120bpm, patient intrinsically begins to Wenckebach 4:3 and 3:2.

## 2023-01-13 ENCOUNTER — Encounter: Payer: Self-pay | Admitting: Internal Medicine

## 2023-01-29 NOTE — Progress Notes (Signed)
Remote pacemaker transmission.   

## 2023-02-09 ENCOUNTER — Encounter: Payer: Self-pay | Admitting: Internal Medicine

## 2023-02-26 ENCOUNTER — Encounter: Payer: Self-pay | Admitting: Internal Medicine

## 2023-02-26 ENCOUNTER — Ambulatory Visit: Payer: PPO | Attending: Cardiology | Admitting: Internal Medicine

## 2023-02-26 ENCOUNTER — Ambulatory Visit: Payer: PPO

## 2023-02-26 VITALS — BP 124/66 | HR 71

## 2023-02-26 DIAGNOSIS — I495 Sick sinus syndrome: Secondary | ICD-10-CM | POA: Diagnosis not present

## 2023-02-26 DIAGNOSIS — I421 Obstructive hypertrophic cardiomyopathy: Secondary | ICD-10-CM

## 2023-02-26 NOTE — Patient Instructions (Addendum)
Follow up as scheduled.  Please call if you do not like the changes made to your pacemaker and we can have you come in to the device clinic for reprogramming.  [phone]

## 2023-02-26 NOTE — Progress Notes (Signed)
Patient Care Team: Jettie Pagan, NP as PCP - General (Nurse Practitioner)   HPI  [name] is a 82 y.o. female Seen today for complaints related to her rate response with the hopes of reprogramming  Changed slope 11>>12   Was also concerned about whether her hypertrophic cardiomyopathy and aortic issues might be contributing to worsening exercise tolerance.  Also complaining of palpitations.   Records and Results Reviewed   Past Medical History:  Diagnosis Date   Asymmetric septal hypertrophy (HCC)    Atrial fibrillation (HCC)    with permanent pacemaker   Blepharitis 10/2016   Dr.Groat   Current use of long term anticoagulation    Endometriosis    Glaucoma    H/O mitral valve repair    Headache    HSV (herpes simplex virus) infection    Gluteal & near mouth   IBS (irritable bowel syndrome) 12/2002   Irregular heart beat    Melanoma (HCC) 12/06/2013   melanoma insitu right thigh (tx exc.)   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, 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   Rib fracture 01/2017   Dr.Green   Severe mitral regurgitation     Past Surgical History:  Procedure Laterality Date   APPENDECTOMY  1952   HYSTEROSCOPY  06/2002   Polyp; Small fibroid   HYSTEROSCOPY  09/10/09   Myoma   MITRAL VALVE REPAIR      (plication of posterior leaflet with 28-mm Medtronic CG Future  Band annuloplasty). (02/23/08  Dr. Cornelius Moras)   MYOMECTOMY     median sternotomy   PACEMAKER INSERTION  02/2008   PPM GENERATOR CHANGEOUT N/A 12/19/2020   Procedure: PPM GENERATOR CHANGEOUT;  Surgeon: Duke Salvia, MD;  Location: [facility] INVASIVE CV LAB;  Service: Cardiovascular;  Laterality: N/A;   TONSILLECTOMY AND ADENOIDECTOMY  1955   TUBAL LIGATION  1983    No outpatient medications have been marked as taking for the 02/26/23 encounter (Office Visit) with Duke Salvia, MD.    Allergies  Allergen Reactions   Amiodarone Other (See Comments)    Severe rash  and upset stomach   Doxycycline     Unknown - pt had other issues going on at the time, unsure if this medication had anything to do with how she felt      Review of Systems negative except from HPI and PMH  Physical Exam BP 124/66   Pulse 71  Well developed and well nourished in no acute distress HENT normal E scleral and icterus clear Neck Supple JVP flat; carotids brisk and full Clear to ausculation Regular rate and rhythm, no murmurs gallops or rub Soft with active bowel sounds No clubbing cyanosis  Edema Alert and oriented, grossly normal motor and sensory function Skin Warm and Dry  ECG    CrCl cannot be calculated (Patient's most recent lab result is older than the maximum 21 days allowed.).   Assessment and  Plan Hypertrophic cardiomyopathy status post myectomy  Family  issues have been addressed   Aortic insufficiency-moderate with normal left ventricular size   Paroxysmal atrial fibrillation    Pacemaker-St. Jude     PVCs and PACs so   Elevated blood pressure  Blood pressure is much improved.  We will repeat an echocardiogram to reassess her aortic insufficiency and look at left ventricular function given exercise and functional deterioration  Palpitations were in part reproduced by ventricular pacing but also in just atrial pacing  with intrinsic conduction.    Current medicines are reviewed at length with the patient today .  The patient does not have concerns regarding medicines.

## 2023-02-27 NOTE — Telephone Encounter (Signed)
Message from Dr. Graciela Husbands:  Could somebody do me a favor, as [name] is going on vacation, call this lady and tell her that after I looked at her chart I thought it was reasonable if she is okay with [place] to repeat her echocardiogram to look at her heart muscle and her valve to see if it may be contributing to why i she is not feeling so good .   Called and spoke w the patient.  She is agreeable to echocardiogram.  I placed the order and scheduled her for 03/03/23.  Pt appreciative for assistance.

## 2023-03-03 ENCOUNTER — Ambulatory Visit (HOSPITAL_COMMUNITY): Payer: PPO | Attending: Cardiology

## 2023-03-03 DIAGNOSIS — I421 Obstructive hypertrophic cardiomyopathy: Secondary | ICD-10-CM | POA: Diagnosis present

## 2023-03-03 LAB — ECHOCARDIOGRAM COMPLETE
Area-P 1/2: 4.12 cm2
P 1/2 time: 324 msec
S' Lateral: 2.4 cm

## 2023-03-06 ENCOUNTER — Encounter: Payer: Self-pay | Admitting: Internal Medicine

## 2023-03-13 NOTE — Progress Notes (Unsigned)
Adjustments to rate response made per Graciela Husbands MD.

## 2023-03-25 ENCOUNTER — Ambulatory Visit (INDEPENDENT_AMBULATORY_CARE_PROVIDER_SITE_OTHER): Payer: PPO

## 2023-03-25 DIAGNOSIS — I495 Sick sinus syndrome: Secondary | ICD-10-CM

## 2023-03-26 LAB — CUP PACEART REMOTE DEVICE CHECK
Battery Remaining Longevity: 95 mo
Battery Remaining Percentage: 83 %
Battery Voltage: 3.02 V
Brady Statistic RA Percent Paced: 96 %
Date Time Interrogation Session: 20240710224852
Implantable Lead Connection Status: 753985
Implantable Lead Connection Status: 753985
Implantable Lead Implant Date: 20090617
Implantable Lead Implant Date: 20090617
Implantable Lead Location: 753859
Implantable Lead Location: 753860
Implantable Pulse Generator Implant Date: 20220406
Lead Channel Impedance Value: 380 Ohm
Lead Channel Pacing Threshold Amplitude: 0.75 V
Lead Channel Pacing Threshold Pulse Width: 0.4 ms
Lead Channel Sensing Intrinsic Amplitude: 5 mV
Lead Channel Setting Pacing Amplitude: 2 V
Pulse Gen Model: 2272
Pulse Gen Serial Number: 3911946

## 2023-03-27 ENCOUNTER — Encounter: Payer: Self-pay | Admitting: Internal Medicine

## 2023-03-30 ENCOUNTER — Encounter: Payer: Self-pay | Admitting: Internal Medicine

## 2023-04-16 ENCOUNTER — Encounter: Payer: Self-pay | Admitting: Internal Medicine

## 2023-04-16 NOTE — Progress Notes (Signed)
Remote pacemaker transmission.   

## 2023-04-20 NOTE — Telephone Encounter (Signed)
GM2 U  Please Inform Patient That both meds were started to address symptomatic palpitations and we canstop either if she wants and if the consequneces are worse than the problem.  I would advise stopping one at a time, prob mag first, and see how she does for a month or so before following suit with the verapamil.  That change may also have an impact on her breating given her HCM  Thanks

## 2023-06-24 ENCOUNTER — Ambulatory Visit (INDEPENDENT_AMBULATORY_CARE_PROVIDER_SITE_OTHER): Payer: PPO

## 2023-06-24 DIAGNOSIS — I495 Sick sinus syndrome: Secondary | ICD-10-CM

## 2023-06-24 LAB — CUP PACEART REMOTE DEVICE CHECK
Battery Remaining Longevity: 94 mo
Battery Remaining Percentage: 81 %
Battery Voltage: 3.02 V
Brady Statistic RA Percent Paced: 96 %
Date Time Interrogation Session: 20241009020015
Implantable Lead Connection Status: 753985
Implantable Lead Connection Status: 753985
Implantable Lead Implant Date: 20090617
Implantable Lead Implant Date: 20090617
Implantable Lead Location: 753859
Implantable Lead Location: 753860
Implantable Pulse Generator Implant Date: 20220406
Lead Channel Impedance Value: 400 Ohm
Lead Channel Pacing Threshold Amplitude: 0.75 V
Lead Channel Pacing Threshold Pulse Width: 0.4 ms
Lead Channel Sensing Intrinsic Amplitude: 5 mV
Lead Channel Setting Pacing Amplitude: 2 V
Pulse Gen Model: 2272
Pulse Gen Serial Number: 3911946

## 2023-07-13 NOTE — Progress Notes (Signed)
Remote pacemaker transmission.   

## 2023-09-15 ENCOUNTER — Ambulatory Visit: Payer: PPO | Attending: Internal Medicine | Admitting: Internal Medicine

## 2023-09-15 ENCOUNTER — Encounter: Payer: Self-pay | Admitting: Internal Medicine

## 2023-09-15 VITALS — BP 108/56 | HR 70 | Ht 59.0 in | Wt 119.0 lb

## 2023-09-15 DIAGNOSIS — Z950 Presence of cardiac pacemaker: Secondary | ICD-10-CM | POA: Diagnosis not present

## 2023-09-15 DIAGNOSIS — I495 Sick sinus syndrome: Secondary | ICD-10-CM | POA: Diagnosis not present

## 2023-09-15 DIAGNOSIS — I421 Obstructive hypertrophic cardiomyopathy: Secondary | ICD-10-CM | POA: Diagnosis not present

## 2023-09-15 DIAGNOSIS — I480 Paroxysmal atrial fibrillation: Secondary | ICD-10-CM

## 2023-09-15 NOTE — Patient Instructions (Signed)
 Medication Instructions:  Your physician recommends that you continue on your current medications as directed. Please refer to the Current Medication list given to you today.  *If you need a refill on your cardiac medications before your next appointment, please call your pharmacy*   Lab Work: None ordered.  If you have labs (blood work) drawn today and your tests are completely normal, you will receive your results only by: MyChart Message (if you have MyChart) OR A paper copy in the mail If you have any lab test that is abnormal or we need to change your treatment, we will call you to review the results.   Testing/Procedures: None ordered.    Follow-Up: At [facility], you and your health needs are our priority.  As part of our continuing mission to provide you with exceptional heart care, we have created designated Provider Care Teams.  These Care Teams include your primary Cardiologist (physician) and Advanced Practice Providers (APPs -  Physician Assistants and Nurse Practitioners) who all work together to provide you with the care you need, when you need it.  We recommend signing up for the patient portal called "MyChart".  Sign up information is provided on this After Visit Summary.  MyChart is used to connect with patients for Virtual Visits (Telemedicine).  Patients are able to view lab/test results, encounter notes, upcoming appointments, etc.  Non-urgent messages can be sent to your provider as well.   To learn more about what you can do with MyChart, go to [email or web address].    Your next appointment:   6 months with Dr Graciela Husbands

## 2023-09-15 NOTE — Progress Notes (Signed)
 Patient Care Team: Chinita Hoy LITTIE DEVONNA as PCP - General (Physician Assistant)   HPI  [name] is a 82 y.o. female  seen in follow-up for [facility] pacemaker implanted originally 2009 with generator change in 4/22.  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  Device is programmed AAIR     The patient denies chest pain.  There have been no palpitations, lightheadedness or syncope.  Complains of shortness of breath and ongoing claudicaiton .    Date Cr K Hgb  4/22 0.73 4.6 13.7  11/24 0.75 4.7 14.7     Records and Results Reviewed   Past Medical History:  Diagnosis Date   Asymmetric septal hypertrophy    Atrial fibrillation [facility])    with permanent pacemaker   Blepharitis 10/2016   Dr.Groat   Current use of long term anticoagulation    Endometriosis    Glaucoma    H/O mitral valve repair    Headache    HSV (herpes simplex virus) infection    Gluteal & near mouth   IBS (irritable bowel syndrome) 12/2002   Irregular heart beat    Melanoma (HCC) 12/06/2013   melanoma insitu right thigh (tx exc.)   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, 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   Rib fracture 01/2017   Dr.Green   Severe mitral regurgitation     Past Surgical History:  Procedure Laterality Date   APPENDECTOMY  1952   HYSTEROSCOPY  06/2002   Polyp; Small fibroid   HYSTEROSCOPY  09/10/09   Myoma   MITRAL VALVE REPAIR      (plication of posterior leaflet with 28-mm Medtronic CG Future  Band annuloplasty). (02/23/08  Dr. Dusty)   MYOMECTOMY     median sternotomy   PACEMAKER INSERTION  02/2008   PPM GENERATOR CHANGEOUT N/A 12/19/2020   Procedure: PPM GENERATOR CHANGEOUT;  Surgeon: Fernande Elspeth BROCKS, MD;  Location: [facility] INVASIVE CV LAB;  Service: Cardiovascular;  Laterality: N/A;   TONSILLECTOMY AND  ADENOIDECTOMY  1955   TUBAL LIGATION  1983    Current Meds  Medication Sig   acetaminophen  (TYLENOL ) 650 MG CR tablet Take 1,300 mg by mouth 2 (two) times daily.   apixaban  (ELIQUIS ) 2.5 MG TABS tablet Take 1 tablet (2.5 mg total) by mouth 2 (two) times daily.   azelastine (ASTELIN) 0.1 % nasal spray Place 1 spray into the nose 2 (two) times daily.   Cholecalciferol (VITAMIN D  HIGH POTENCY PO) Take 10,000 Int'l Units by mouth once a week. Ultra   cycloSPORINE  (RESTASIS ) 0.05 % ophthalmic emulsion Place 1 drop into both eyes 2 (two) times daily.   diclofenac Sodium (VOLTAREN) 1 % GEL Apply 1 application topically 2 (two) times daily. Knees   diphenhydrAMINE  (BENADRYL ) 50 MG capsule Take 50 mg by mouth at bedtime.    dorzolamide-timolol (COSOPT) 22.3-6.8 MG/ML ophthalmic solution Place 1 drop into both eyes 2 (two) times daily.   Eyelid Cleansers (AVENOVA) 0.01 % SOLN Place 1 application into both eyes 2 (two) times daily.   fluticasone (FLONASE) 50 MCG/ACT nasal spray Place 1 spray into both nostrils at bedtime.   latanoprost  (XALATAN ) 0.005 % ophthalmic solution Place 1 drop into both eyes at bedtime.   levocetirizine (XYZAL) 5 MG tablet Take 5 mg by mouth every evening.   loperamide (IMODIUM A-D) 2 MG tablet Take 2  mg by mouth 4 (four) times daily as needed for diarrhea or loose stools.   MAGNESIUM  CL-CALCIUM  CARBONATE PO Take 1 tablet by mouth 2 (two) times daily. 500 mg /1000 mg   Omega-3 Fatty Acids (FISH OIL) 1000 MG CAPS Take 2 capsules by mouth once a week. Pro Omega D   rosuvastatin  (CRESTOR ) 10 MG tablet TAKE 1 TABLET(10 MG) BY MOUTH DAILY   valACYclovir (VALTREX) 500 MG tablet Take 500 mg by mouth every other day.    verapamil  (CALAN -SR) 120 MG CR tablet Take 1 tablet (120 mg total) by mouth 2 (two) times daily.   [DISCONTINUED] Probiotic Product (PROBIOTIC DAILY PO) Take 1 tablet by mouth daily. ortho spore ig    Allergies  Allergen Reactions   Amiodarone Other (See Comments)     Severe rash and upset stomach   Doxycycline     Unknown - pt had other issues going on at the time, unsure if this medication had anything to do with how she felt      Review of Systems negative except from HPI and PMH  Physical Exam BP (!) 108/56   Pulse 70   Ht 4' 11 (1.499 m)   Wt 119 lb (54 kg)   SpO2 96%   BMI 24.04 kg/m  Well developed and well nourished in no acute distress HENT normal Neck supple with JVP-flat Clear Device pocket well healed; without hematoma or erythema.  There is no tethering  Regular rate and rhythm, 2/6 murmur Abd-soft with active BS No Clubbing cyanosis  edema Skin-warm and dry A & Oriented  Grossly normal sensory and motor function  ECG a pacing 71 21/13/44  Device function is normal. Programming changes slope decreased from 12 --11  See Paceart for details    CrCl cannot be calculated (Patient's most recent lab result is older than the maximum 21 days allowed.).   Assessment and  Plan Hypertrophic cardiomyopathy status post myectomy  Family  issues have been addressed   Aortic insufficiency-moderate with normal left ventricular size   Paroxysmal atrial fibrillation    Pacemaker-St. Jude     PVCs and PACs    Intermittent claudication   Elevated blood pressure BP is stable  Have reprogrammed the rate response to try to help with her dyspnea as 12% of her beats were at her upper sensor rate  As relates to claudication, encouraged her to continue to exercise.  She describes it as a small nuisance I told her that if the symptoms worsen, we can send her back to Dr. HONOR   Current medicines are reviewed at length with the patient today .  The patient does not have concerns regarding medicines.

## 2023-09-20 LAB — CUP PACEART INCLINIC DEVICE CHECK
Battery Remaining Longevity: 90 mo
Battery Voltage: 3.02 V
Brady Statistic RA Percent Paced: 99.86 %
Brady Statistic RV Percent Paced: 0 %
Date Time Interrogation Session: 20241231155115
Implantable Lead Connection Status: 753985
Implantable Lead Connection Status: 753985
Implantable Lead Implant Date: 20090617
Implantable Lead Implant Date: 20090617
Implantable Lead Location: 753859
Implantable Lead Location: 753860
Implantable Pulse Generator Implant Date: 20220406
Lead Channel Sensing Intrinsic Amplitude: 5 mV
Lead Channel Setting Pacing Amplitude: 2 V
Pulse Gen Model: 2272
Pulse Gen Serial Number: 3911946

## 2023-09-23 ENCOUNTER — Ambulatory Visit (INDEPENDENT_AMBULATORY_CARE_PROVIDER_SITE_OTHER): Payer: PPO

## 2023-09-23 DIAGNOSIS — I495 Sick sinus syndrome: Secondary | ICD-10-CM

## 2023-09-24 LAB — CUP PACEART REMOTE DEVICE CHECK
Battery Remaining Longevity: 91 mo
Battery Remaining Percentage: 78 %
Battery Voltage: 3.02 V
Brady Statistic RA Percent Paced: 96 %
Date Time Interrogation Session: 20250108020012
Implantable Lead Connection Status: 753985
Implantable Lead Connection Status: 753985
Implantable Lead Implant Date: 20090617
Implantable Lead Implant Date: 20090617
Implantable Lead Location: 753859
Implantable Lead Location: 753860
Implantable Pulse Generator Implant Date: 20220406
Lead Channel Impedance Value: 390 Ohm
Lead Channel Pacing Threshold Amplitude: 0.75 V
Lead Channel Pacing Threshold Pulse Width: 0.4 ms
Lead Channel Sensing Intrinsic Amplitude: 5 mV
Lead Channel Setting Pacing Amplitude: 2 V
Pulse Gen Model: 2272
Pulse Gen Serial Number: 3911946

## 2023-10-07 ENCOUNTER — Telehealth: Payer: Self-pay | Admitting: Internal Medicine

## 2023-10-07 NOTE — Telephone Encounter (Signed)
Request received to transfer GI care from outside practice to [facility].  We appreciate the interest in our practice, however at this time due to high demand from patients without established GI providers we cannot accommodate this transfer.      

## 2023-10-07 NOTE — Telephone Encounter (Signed)
Good afternoon Dr. Rhea Belton,   Supervising Provider PM 10/07/2023    We received a referral for patient to be seen for IBS with diarrhea. Patient last seen with Dr. Elpidio Anis at [facility]. Patient states she would like to transfer due to being unhappy with plan of care. Patient's previous records are in [facility] for you to review and advise on scheduling.    Thank you.

## 2023-10-09 NOTE — Telephone Encounter (Signed)
Patient advised.

## 2023-10-20 ENCOUNTER — Encounter: Payer: Self-pay | Admitting: Cardiovascular Disease

## 2023-10-20 ENCOUNTER — Ambulatory Visit: Payer: PPO | Attending: Cardiovascular Disease | Admitting: Cardiovascular Disease

## 2023-10-20 VITALS — BP 110/76 | HR 75 | Ht 59.0 in | Wt 119.0 lb

## 2023-10-20 DIAGNOSIS — I739 Peripheral vascular disease, unspecified: Secondary | ICD-10-CM

## 2023-10-20 DIAGNOSIS — E785 Hyperlipidemia, unspecified: Secondary | ICD-10-CM | POA: Diagnosis not present

## 2023-10-20 DIAGNOSIS — Z8679 Personal history of other diseases of the circulatory system: Secondary | ICD-10-CM

## 2023-10-20 DIAGNOSIS — I480 Paroxysmal atrial fibrillation: Secondary | ICD-10-CM

## 2023-10-20 NOTE — Progress Notes (Signed)
 Cardiology Office Note   Date:  10/20/2023   ID:  [name], DOB 1941/07/25, MRN [id number]  PCP:  Chinita Hoy CROME, PA-C  Cardiologist: Dr. Fernande  No chief complaint on file.      History of Present Illness: [name] is a 83 y.o. female who is here today for follow-up visit regarding peripheral arterial disease.   She has known history of sinus node dysfunction status post pacemaker placement in 2009, hypertrophic cardiomyopathy status post mitral valve repair and septal myomectomy, paroxysmal atrial fibrillation and hyperlipidemia.  She is a lifelong non-smoker and is not diabetic.  She was seen in 2022 for bilateral leg claudication worse on the right side.    Lower extremity arterial Doppler was done in September 2022 which showed an ABI of 0.83 on the right and 1.01 on the left.  Duplex showed severe right mid SFA stenosis with peak velocity of 451.  On the left, there was moderate SFA disease. Her claudication was mild overall and thus she was treated medically.  She improved with an exercise program.  However, she now reports worsening right calf claudication that only happens when she is trying to do yoga exercises.  This does not happen with regular activities.  No rest pain or lower extremity ulceration. She is also frustrated with GI symptoms due to irritable bowel syndrome.  Past Medical History:  Diagnosis Date   Asymmetric septal hypertrophy    Atrial fibrillation [facility])    with permanent pacemaker   Blepharitis 10/2016   Dr.Groat   Current use of long term anticoagulation    Endometriosis    Glaucoma    H/O mitral valve repair    Headache    HSV (herpes simplex virus) infection    Gluteal & near mouth   IBS (irritable bowel syndrome) 12/2002   Irregular heart beat    Melanoma (HCC) 12/06/2013   melanoma insitu right thigh (tx exc.)   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, 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   Rib fracture 01/2017    Dr.Green   Severe mitral regurgitation     Past Surgical History:  Procedure Laterality Date   APPENDECTOMY  1952   HYSTEROSCOPY  06/2002   Polyp; Small fibroid   HYSTEROSCOPY  09/10/09   Myoma   MITRAL VALVE REPAIR      (plication of posterior leaflet with 28-mm Medtronic CG Future  Band annuloplasty). (02/23/08  Dr. Dusty)   MYOMECTOMY     median sternotomy   PACEMAKER INSERTION  02/2008   PPM GENERATOR CHANGEOUT N/A 12/19/2020   Procedure: PPM GENERATOR CHANGEOUT;  Surgeon: Fernande Elspeth BROCKS, MD;  Location: [facility] INVASIVE CV LAB;  Service: Cardiovascular;  Laterality: N/A;   TONSILLECTOMY AND ADENOIDECTOMY  1955   TUBAL LIGATION  1983     Current Outpatient Medications  Medication Sig Dispense Refill   acetaminophen  (TYLENOL ) 650 MG CR tablet Take 1,300 mg by mouth 2 (two) times daily.     apixaban  (ELIQUIS ) 2.5 MG TABS tablet Take 1 tablet (2.5 mg total) by mouth 2 (two) times daily. 180 tablet 1   azelastine (ASTELIN) 0.1 % nasal spray Place 1 spray into the nose 2 (two) times daily.     cycloSPORINE  (RESTASIS ) 0.05 % ophthalmic emulsion Place 1 drop into both eyes 2 (two) times daily.     diclofenac Sodium (VOLTAREN) 1 % GEL Apply 1 application topically 2 (two) times daily. Knees     diphenhydrAMINE  (BENADRYL )  50 MG capsule Take 50 mg by mouth at bedtime.      dorzolamide-timolol (COSOPT) 22.3-6.8 MG/ML ophthalmic solution Place 1 drop into both eyes 2 (two) times daily.     Eyelid Cleansers (AVENOVA) 0.01 % SOLN Place 1 application into both eyes 2 (two) times daily.     fluticasone (FLONASE) 50 MCG/ACT nasal spray Place 1 spray into both nostrils at bedtime.     latanoprost  (XALATAN ) 0.005 % ophthalmic solution Place 1 drop into both eyes at bedtime.     levocetirizine (XYZAL) 5 MG tablet Take 5 mg by mouth every evening.     loperamide (IMODIUM A-D) 2 MG tablet Take 2 mg by mouth 4 (four) times daily as needed for diarrhea or loose stools.     MAGNESIUM  CL-CALCIUM  CARBONATE PO  Take 1 tablet by mouth 2 (two) times daily. 500 mg /1000 mg     Omega-3 Fatty Acids (FISH OIL) 1000 MG CAPS Take 2 capsules by mouth once a week. Pro Omega D     rosuvastatin  (CRESTOR ) 10 MG tablet TAKE 1 TABLET(10 MG) BY MOUTH DAILY 90 tablet 3   valACYclovir (VALTREX) 500 MG tablet Take 500 mg by mouth every other day.      verapamil  (CALAN -SR) 120 MG CR tablet Take 1 tablet (120 mg total) by mouth 2 (two) times daily. 180 tablet 2   No current facility-administered medications for this visit.    Allergies:   Amiodarone and Doxycycline    Social History:  The patient  reports that she has quit smoking. She has never used smokeless tobacco. She reports current alcohol use. She reports current drug use. Drug: Marijuana.   Family History:  The patient's family history includes Breast cancer (age of onset: 29) in her maternal grandmother; Breast cancer (age of onset: 42) in her mother; Coronary artery disease in her brother and brother; Heart attack in her father; Heart disease in her mother.    ROS:  Please see the history of present illness.   Otherwise, review of systems are positive for none.   All other systems are reviewed and negative.    PHYSICAL EXAM: VS:  BP 110/76   Pulse 75   Ht 4' 11 (1.499 m)   Wt 119 lb (54 kg)   SpO2 94%   BMI 24.04 kg/m  , BMI Body mass index is 24.04 kg/m. GEN: Well nourished, well developed, in no acute distress  HEENT: normal  Neck: no JVD, carotid bruits, or masses Cardiac: RRR; no murmurs, rubs, or gallops,no edema  Respiratory:  clear to auscultation bilaterally, normal work of breathing GI: soft, nontender, nondistended, + BS MS: no deformity or atrophy  Skin: warm and dry, no rash Neuro:  Strength and sensation are intact Psych: euthymic mood, full affect    EKG:  EKG is not ordered today.    Recent Labs: No results found for requested labs within last 365 days.    Lipid Panel    Component Value Date/Time   CHOL 145  08/30/2021 0844   TRIG 153 (H) 08/30/2021 0844   HDL 72 08/30/2021 0844   CHOLHDL 2.0 08/30/2021 0844   CHOLHDL 2.7 CALC 08/10/2007 0840   VLDL 24 08/10/2007 0840   LDLCALC 48 08/30/2021 0844   LDLDIRECT 135.1 04/05/2007 0849      Wt Readings from Last 3 Encounters:  10/20/23 119 lb (54 kg)  09/15/23 119 lb (54 kg)  09/09/22 121 lb 9.6 oz (55.2 kg)  03/19/2017   10:38 AM  PAD Screen  Previous PAD dx? No  Previous surgical procedure? No  Pain with walking? No  Feet/toe relief with dangling? Yes  Painful, non-healing ulcers? No  Extremities discolored? No      ASSESSMENT AND PLAN:  1.  Peripheral arterial disease with intermittent claudication mostly affecting the right calf: This is due to significant stenosis in the right SFA.  There is moderate SFA disease on the left side. She reports worsening right calf claudication but symptoms are still not severe.  I requested a follow-up ABI. She is interested in attending vascular rehab but she wants to wait until her GI symptoms are improved. The patient was educated on risk factors surrounding PAD. I discussed the importance of controlling risk factors and importance of regular exercise to help reduce pain. I discussed the effects of exercise on reducing claudications and improving his risk factors including hyperlipidemia and hypertension.    2.  Hyperlipidemia: She is tolerating rosuvastatin  10 mg daily.  Most recent lipid profile showed an LDL of 48.  3.  Paroxysmal atrial fibrillation: She seems to be in sinus rhythm.  She is on anticoagulation with Eliquis .  4.  History of hypertrophic cardiomyopathy status post myomectomy and mitral valve repair.    Disposition:   FU with me in 12 months  Signed,  Deatrice Cage, MD  10/20/2023 11:01 AM    [facility]

## 2023-10-20 NOTE — Patient Instructions (Signed)
 Medication Instructions:  No changes *If you need a refill on your cardiac medications before your next appointment, please call your pharmacy*   Lab Work: None ordered If you have labs (blood work) drawn today and your tests are completely normal, you will receive your results only by: MyChart Message (if you have MyChart) OR A paper copy in the mail If you have any lab test that is abnormal or we need to change your treatment, we will call you to review the results.   Testing/Procedures: Your physician has requested that you have an ankle brachial index (ABI). During this test an ultrasound and blood pressure cuff are used to evaluate the arteries that supply the arms and legs with blood. Allow thirty minutes for this exam. There are no restrictions or special instructions. This will take place at [address], [address].    Please note: We ask at that you not bring children with you during ultrasound (echo/ vascular) testing. Due to room size and safety concerns, children are not allowed in the ultrasound rooms during exams. Our front office staff cannot provide observation of children in our lobby area while testing is being conducted. An adult accompanying a patient to their appointment will only be allowed in the ultrasound room at the discretion of the ultrasound technician under special circumstances. We apologize for any inconvenience.    Follow-Up: At [facility], you and your health needs are our priority.  As part of our continuing mission to provide you with exceptional heart care, we have created designated Provider Care Teams.  These Care Teams include your primary Cardiologist (physician) and Advanced Practice Providers (APPs -  Physician Assistants and Nurse Practitioners) who all work together to provide you with the care you need, when you need it.  We recommend signing up for the patient portal called MyChart.  Sign up information is provided on this  After Visit Summary.  MyChart is used to connect with patients for Virtual Visits (Telemedicine).  Patients are able to view lab/test results, encounter notes, upcoming appointments, etc.  Non-urgent messages can be sent to your provider as well.   To learn more about what you can do with MyChart, go to [email or web address].    Your next appointment:   6 month(s)  Provider:   Dr. Arida

## 2023-10-27 ENCOUNTER — Encounter: Payer: Self-pay | Admitting: Cardiovascular Disease

## 2023-10-30 ENCOUNTER — Encounter: Payer: Self-pay | Admitting: Internal Medicine

## 2023-11-04 ENCOUNTER — Encounter: Payer: Self-pay | Admitting: Cardiovascular Disease

## 2023-11-05 NOTE — Addendum Note (Signed)
Addended by: Elease Etienne A on: 11/05/2023 03:00 PM   Modules accepted: Orders

## 2023-11-05 NOTE — Progress Notes (Signed)
 Remote pacemaker transmission.

## 2023-11-06 ENCOUNTER — Telehealth (HOSPITAL_COMMUNITY): Payer: Self-pay

## 2023-11-06 NOTE — Telephone Encounter (Signed)
Pt called and stated she was returning a phone call in regards to setting up "PT" and someone left her a VM with our phone number.  Was able to locate pt referral for our SET program. Adv pt per her mychart message she would like to start after she has her ABI appt.   Asked pt about her GI symptoms and she stated "they have improved".  Adv pt once she has completed her ABI appt and our EP Johnny has review and clear her to start, I will check her insurance benefits and give her a call at a later date to schedule her.

## 2023-11-06 NOTE — Telephone Encounter (Addendum)
Outgoing phone call made to pt regarding SET program   Received referral from Dr. 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  During phone call pt reports that pain also presents when walking at brisk/fast pace.   Pt seen in follow up on 10/20/2023. The patient received information regarding cardiovascular disease and PAD risk factor reduction, which could include education, counseling, behavior interventions, and outcome assessments.  Pt had ABI completed on 05/2021 which showed 0.83 Right and 1.01 Left.  Pt has another appt to recheck ABI on Thursday.   Pt would like to begin program as quickly as possible and would prefer coming to the Monday/Friday class at 10:15am.   There are no absolute contraindications barring participation in the program. Will send referral over to support staff so they can verify insurance coverage and benefits.   Will call pt at later date to schedule orientation.

## 2023-11-11 ENCOUNTER — Encounter: Payer: Self-pay | Admitting: Internal Medicine

## 2023-11-12 ENCOUNTER — Ambulatory Visit (HOSPITAL_COMMUNITY)
Admission: RE | Admit: 2023-11-12 | Discharge: 2023-11-12 | Disposition: A | Discharge status: Home / Self Care | Payer: PPO | Source: Ambulatory Visit | Attending: Cardiology | Admitting: Cardiology

## 2023-11-12 DIAGNOSIS — I739 Peripheral vascular disease, unspecified: Secondary | ICD-10-CM | POA: Diagnosis present

## 2023-11-15 LAB — VAS US ABI WITH/WO TBI
Left ABI: 1
Right ABI: 0.66

## 2023-11-18 ENCOUNTER — Encounter: Payer: Self-pay | Admitting: Cardiovascular Disease

## 2023-11-18 ENCOUNTER — Telehealth: Payer: Self-pay | Admitting: *Deleted

## 2023-11-18 NOTE — Telephone Encounter (Signed)
 The patient has been advised of the results and educated on the meaning. She is due to start the vascular rehab soon.   Iran Ouch, MD 11/15/2023  2:07 PM EST     Moderately reduced ABI on the right side which is worse than what it was in 2022.  Recommend that she attends SET.  If no improvement in symptoms, we can consider angiography and revascularization.

## 2023-11-25 ENCOUNTER — Encounter: Payer: Self-pay | Admitting: Cardiovascular Disease

## 2023-11-27 ENCOUNTER — Telehealth (HOSPITAL_COMMUNITY): Payer: Self-pay

## 2023-11-27 NOTE — Telephone Encounter (Signed)
 Pt insurance is active and benefits verified through [employer]. Co-pay $0.00, DED $0.00/$0.00 met, out of pocket $3,400.00/$148.61 met, co-insurance 30%. No pre-authorization required. Manish/[employer], 11/27/23 @ 1:43PM, ZOX#[id number]

## 2023-11-27 NOTE — Telephone Encounter (Signed)
 Completed phone cal with pt regarding SET orientation and subsequent visits. Pt to start on 12/03/23 and graduate on 02/19/24.

## 2023-12-03 ENCOUNTER — Encounter: Payer: Self-pay | Admitting: Internal Medicine

## 2023-12-03 ENCOUNTER — Ambulatory Visit (HOSPITAL_COMMUNITY)

## 2023-12-03 ENCOUNTER — Encounter (HOSPITAL_COMMUNITY)
Admission: RE | Admit: 2023-12-03 | Discharge: 2023-12-03 | Disposition: A | Discharge status: Home / Self Care | Source: Ambulatory Visit | Attending: Cardiovascular Disease | Admitting: Cardiovascular Disease

## 2023-12-03 VITALS — BP 106/58 | Ht 59.0 in | Wt 122.4 lb

## 2023-12-03 DIAGNOSIS — I70211 Atherosclerosis of native arteries of extremities with intermittent claudication, right leg: Secondary | ICD-10-CM | POA: Diagnosis present

## 2023-12-03 MED ORDER — VERAPAMIL HCL ER 120 MG PO TBCR
120.0000 mg | EXTENDED_RELEASE_TABLET | Freq: Two times a day (BID) | ORAL | 2 refills | Status: DC
Start: 2023-12-03 — End: 2024-01-19

## 2023-12-03 NOTE — Progress Notes (Signed)
 PAT/SET Individual Treatment Plan  Patient Details  Name: [name] MRN: [id number] Date of Birth: [birth year] Referring Provider:   Doristine Devoid Supervised Exercise Therapy from 12/03/2023 in [facility]  Referring Provider Dr. Lorine Bears       Initial Encounter Date:  Flowsheet Row Supervised Exercise Therapy from 12/03/2023 in [facility]  Date 12/03/23       Visit Diagnosis: 11/12/23 Atherosclerosis of native artery of right lower extremity with intermittent claudication (HCC)  Patient's Home Medications on Admission:  Current Outpatient Medications:    acetaminophen (TYLENOL) 650 MG CR tablet, Take 1,300 mg by mouth 2 (two) times daily., Disp: , Rfl:    apixaban (ELIQUIS) 2.5 MG TABS tablet, Take 1 tablet (2.5 mg total) by mouth 2 (two) times daily., Disp: 180 tablet, Rfl: 1   azelastine (ASTELIN) 0.1 % nasal spray, Place 1 spray into the nose 2 (two) times daily., Disp: , Rfl:    cycloSPORINE (RESTASIS) 0.05 % ophthalmic emulsion, Place 1 drop into both eyes 2 (two) times daily., Disp: , Rfl:    diclofenac Sodium (VOLTAREN) 1 % GEL, Apply 1 application topically 2 (two) times daily. Knees, Disp: , Rfl:    dicyclomine (BENTYL) 20 MG tablet, Take 20 mg by mouth 2 (two) times daily., Disp: , Rfl:    diphenhydrAMINE (BENADRYL) 50 MG capsule, Take 50 mg by mouth at bedtime. , Disp: , Rfl:    dorzolamide-timolol (COSOPT) 22.3-6.8 MG/ML ophthalmic solution, Place 1 drop into both eyes 2 (two) times daily., Disp: , Rfl:    Eyelid Cleansers (AVENOVA) 0.01 % SOLN, Place 1 application into both eyes 2 (two) times daily., Disp: , Rfl:    latanoprost (XALATAN) 0.005 % ophthalmic solution, Place 1 drop into both eyes at bedtime., Disp: , Rfl:    levocetirizine (XYZAL) 5 MG tablet, Take 5 mg by mouth every evening., Disp: , Rfl:    MAGNESIUM CL-CALCIUM CARBONATE PO,  Take 1 tablet by mouth 2 (two) times daily. 500 mg /1000 mg, Disp: , Rfl:    rosuvastatin (CRESTOR) 10 MG tablet, TAKE 1 TABLET(10 MG) BY MOUTH DAILY, Disp: 90 tablet, Rfl: 3   valACYclovir (VALTREX) 500 MG tablet, Take 500 mg by mouth every other day. , Disp: , Rfl:    fluticasone (FLONASE) 50 MCG/ACT nasal spray, Place 1 spray into both nostrils at bedtime. (Patient not taking: Reported on 12/03/2023), Disp: , Rfl:    loperamide (IMODIUM A-D) 2 MG tablet, Take 2 mg by mouth 4 (four) times daily as needed for diarrhea or loose stools. (Patient not taking: Reported on 12/03/2023), Disp: , Rfl:    Omega-3 Fatty Acids (FISH OIL) 1000 MG CAPS, Take 2 capsules by mouth once a week. Pro Omega D (Patient not taking: Reported on 12/03/2023), Disp: , Rfl:    verapamil (CALAN-SR) 120 MG CR tablet, Take 1 tablet (120 mg total) by mouth 2 (two) times daily., Disp: 180 tablet, Rfl: 2  Past Medical History: Past Medical History:  Diagnosis Date   Asymmetric septal hypertrophy    Atrial fibrillation (HCC)    with permanent pacemaker   Blepharitis 10/2016   Dr.Groat   Current use of long term anticoagulation    Endometriosis    Glaucoma    H/O mitral valve repair    Headache    HSV (herpes simplex virus) infection    Gluteal & near mouth  IBS (irritable bowel syndrome) 12/2002   Irregular heart beat    Melanoma (HCC) 12/06/2013   melanoma insitu right thigh (tx exc.)   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, 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   Rib fracture 01/2017   Dr.Green   Severe mitral regurgitation     Tobacco Use: Social History   Tobacco Use  Smoking Status Former  Smokeless Tobacco Never  Tobacco Comments   QUIT 45+ YEARS AGO    Labs: Review Flowsheet  More data exists      Latest Ref Rng & Units 08/10/2007 02/17/2008 02/21/2008 02/23/2008 08/30/2021  Labs for ITP Cardiac and Pulmonary Rehab  Cholestrol 100 - 199 mg/dL 811  - - - 914   LDL (calc) 0 - 99 mg/dL 94  -  - - 48   HDL-C >78 mg/dL 29.5  - - - 72   Trlycerides 0 - 149 mg/dL 621  - - - 308   Hemoglobin A1c - - - 5.2 (NOTE)   The ADA recommends the following therapeutic goals for glycemic   control related to Hgb A1C measurement:   Goal of Therapy:   < 7.0% Hgb A1C   Action Suggested:  > 8.0% Hgb A1C   Ref:  Diabetes Care, 22, Suppl. 1, 1999  - -  PH, Arterial - - 7.450  7.418  7.401  7.386  7.486  -  PCO2 arterial - - 36.0  38.9  32.7  33.7  30.7  -  Bicarbonate - - 25.0  26.2  24.7  20.2  21.0  23.2  -  TCO2 - - 26  28  25.8  21  21  22  24   -  Acid-base deficit - - - - 4.0  4.0  -  O2 Saturation - - 97.0  68.0  97.4  99.0  99.0  100.0  -    Details       Multiple values from one day are sorted in reverse-chronological order         Capillary Blood Glucose: No results found for: "GLUCAP"   Exercise Target Goals: Exercise Program Goal: Individual exercise prescription set with THRR, safety & activity barriers. Participant demonstrates ability to understand and report RPE using RPE 6-20 scale, to self-measure pulse accurately, and to acknowledge the importance of the exercise prescription.  Exercise Prescription Goal: Use initial exercise assessment to set exercise prescription to improve time and distance to onset of claudication pain. Provide education to aid in steps toward risk factor modification, improve quality of life with claudication, and utilize THRR and exercise prescription for best results for decreasing symptoms of PAD. Prevent injury to bone, joints, and skin integrity during the exercise through checks of each system during session check in. Following physician guidelines for any contraindications to specific exercises.  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, BL knee arthritis    Cardiac Risk Stratification Moderate             Gardner Treadmill Test Assessment:  Treadmill Test     Row Name 12/03/23 1206       Treadmill Test   Phase Pre    Speed if <2.0 mph 1.2 mph    Time to Claudication Onset 4.34 minutes  Total Time to 3-4/5 Claudication Pain 6.4 minutes      Heart Rate   Rest 85 bpm    0% Grade 88 bpm    2% Grade 89 bpm    4% Grade 88 bpm    6% Grade 89 bpm      Blood Pressure   Rest 106/58    6% Grade 142/60  Post    2 Min Post 130/54      Claudication Score   0% 1    2% Grade 1    4% Grade 2    6% Grade 3      RPE   0% 11    2% Grade 12.5    4% Grade 13    6% Grade 14      Time in Stage   0% 2 minutes    2% Grade 2 minutes    4% Grade 2 minutes    6% Grade 0.87 minutes  seconds             Walking Impairment Questionnaire:  Walking Impairment Questionnaire - 12/03/23 1229       A. PAD Specific Questions   Leg Right    Pre Score 75    Pre % Score 1875 %      B. Differential Diagnosis   Pre Score 14      Distance   Pre Distance Total Score 29.3    Pre Distance % Score 0.21 %      Speed   Pre Speed Total Score 26.04    Pre Speed % Score 56.61 %      Stairs   Pre Stairs Total Score 8.33    Pre Stairs % Score 2.89 %      Distance, Speed, and Stairs   Pre Distance, Speed, and Stairs Total Score 63.67    Pre Distance, Speed, and Stairs % Score Mean 19.9 %             Expectation is that scores will be within the SD range. Distance Pre % Score Mean: 38% (SD 12% - 0.64%)   Post % Score Mean: 55% (SD 26% - 84%)   Mean % Change: 18% (SD (-10%) - (46%))  Speed Pre % Score Mean: 41% (SD 19% - 63%)   Post % Score Mean: 52% (SD 30% - 74%)   Mean % Change: 11% (SD (-9%) - (31%))  Stairs Pre % Score Mean: 55% (SD 23% - 87%)   Post % Score Mean: 68% (SD 39% - 97%)   Mean % Change: 14% (SD (-15%) - (43%))  Total Score Pre % Score Mean: 45% (SD 23% - 67%    Post % Score Mean: 58% (SD 36% - 80%)   Mean % Change: 14% (SD (-5%) - (43%))   Oxygen Initial Assessment:   Oxygen Re-Evaluation:   Oxygen Discharge (Final Oxygen Re-Evaluation):   Initial Exercise Prescription:  Initial Exercise Prescription - 12/03/23 1200       Date of Initial Exercise RX and Referring Provider   Date 12/03/23    Referring Provider Dr. Lorine Bears    Expected Discharge Date 02/19/24      Treadmill   MPH 1.2    Grade 4    Minutes 20    METs 2.6      Prescription Details   Frequency (times per week) 2 days in program, 1 day at home    Duration Progress to 10 minutes continuous walking  at current work  load and total walking time to 30-45 min      Intensity   THRR 40-80% of Max Heartrate 55-110    Ratings of Perceived Exertion 11-13    Perceived Dyspnea 0-4      Progression   Progression Continue to follow PAD protocol      Resistance Training   Training Prescription Yes    Weight 2    Reps 10-15             Perform Capillary Blood Glucose checks as needed.  Exercise Prescription Changes:   Exercise Comments:   Exercise Comments     Row Name 12/03/23 1212           Exercise Comments Pt orientation, pt completed TM test. All VSS after test.                Exercise Goals and Review:   Exercise Goals     Row Name 12/03/23 1211             Exercise Goals   Increase Physical Activity Yes       Intervention Provide advice, education, support and counseling about physical activity/exercise needs.;Develop an individualized exercise prescription for aerobic and resistive training based on initial evaluation findings, risk stratification, comorbidities and participant's personal goals.       Expected Outcomes Short Term: Attend rehab on a regular basis to increase amount of physical activity.;Long Term: Exercising regularly at least 3-5 days a week.;Long Term: Add in home exercise to make exercise part of routine and  to increase amount of physical activity.       Increase Strength and Stamina Yes       Intervention Provide advice, education, support and counseling about physical activity/exercise needs.;Develop an individualized exercise prescription for aerobic and resistive training based on initial evaluation findings, risk stratification, comorbidities and participant's personal goals.       Expected Outcomes Short Term: Increase workloads from initial exercise prescription for resistance, speed, and METs.;Short Term: Perform resistance training exercises routinely during rehab and add in resistance training at home;Long Term: Improve cardiorespiratory fitness, muscular endurance and strength as measured by increased METs and functional capacity ( )       Able to understand and use rate of perceived exertion (RPE) scale Yes       Intervention Provide education and explanation on how to use RPE scale       Expected Outcomes Short Term: Able to use RPE daily in rehab to express subjective intensity level;Long Term:  Able to use RPE to guide intensity level when exercising independently       Knowledge and understanding of Target Heart Rate Range (THRR) Yes       Intervention Provide education and explanation of THRR including how the numbers were predicted and where they are located for reference       Expected Outcomes Short Term: Able to state/look up THRR;Long Term: Able to use THRR to govern intensity when exercising independently;Short Term: Able to use daily as guideline for intensity in rehab       Understanding of Exercise Prescription Yes       Intervention Provide education, explanation, and written materials on patient's individual exercise prescription       Expected Outcomes Short Term: Able to explain program exercise prescription;Long Term: Able to explain home exercise prescription to exercise independently       Improve claudication pain toleration; Improve walking ability Yes        Intervention  Participate in PAD/SET Rehab 2-3 days a week, walking at home as part of exercise prescription;Attend education sessions to aid in risk factor modification and understanding of disease process       Expected Outcomes Long Term: Improve score of PAD questionnaires;Long Term: Improve walking ability and toleration to claudication;Short Term: Improve walking distance/time to onset of claudication pain                Exercise Goals Re-Evaluation :    Discharge Exercise Prescription (Final Exercise Prescription Changes):   Nutrition:  Target Goals: Understanding of nutrition guidelines, daily intake of sodium 1500mg , cholesterol 200mg , calories 30% from fat and 7% or less from saturated fats, daily to have 5 or more servings of fruits and vegetables.  Biometrics:  Pre Biometrics - 12/03/23 1202       Pre Biometrics   Waist Circumference 38 inches    Hip Circumference 40 inches    Waist to Hip Ratio 0.95 %    Triceps Skinfold 7 mm    % Body Fat 33.3 %    Grip Strength 14 kg    Flexibility 12.5 in    Single Leg Stand 19.8 seconds              Psychosocial: Target Goals: Acknowledge presence or absence of significant depression and/or stress, maximize coping skills, provide positive support system. Participant is able to verbalize types and ability to use techniques and skills needed for reducing stress and depression.  Initial Review & Psychosocial Screening:   Quality of Life Scores:  Quality of Life - 12/03/23 1222       Quality of Life   Select PAD/SET Quality of Life      PAD/SET Quality of Life Scores   Social Relationships and Interactions Pre 45    Self-Concepts and Feelings Pre 33    Symptoms and Limitations Pre 32    Fear and Uncertainty Pre 20    Positive Adaptation Pre 27    Job Pre 5    Sex Pre 5    Intimate Relationships Pre 6             Scores should be at or above lower SD. Change of 5 points or better indicates improvement  in first 5 Factors. Maximum score for last three Factors is 6.  Score Interpretation Lower SD  Social Relationships and Interactions 23.99  Self-Concept and Feelings 17.79  Symptoms and Limitations 11.75  Fear and Uncertainty 8.96  Positive Adaptation 22.29  Job 2.08  Sexual Function 1.43  Intimate Mean 2.08   PHQ-9: Review Flowsheet        No data to display         Interpretation of Total Score  Total Score Depression Severity:  1-4 = Minimal depression, 5-9 = Mild depression, 10-14 = Moderate depression, 15-19 = Moderately severe depression, 20-27 = Severe depression    Vocational Rehabilitation: Provide vocational rehab assistance to qualifying candidates.   Vocational Rehab Evaluation & Intervention:  Vocational Rehab - 12/03/23 1227       Initial Vocational Rehab Evaluation & Intervention   Assessment shows need for Vocational Rehabilitation No             Education: Education Goals: Education classes will be provided on a variety of topics geared toward better understanding of heart and vascular health and risk factor modification. Participant will state understanding/return demonstration of topics presented as noted by education test scores.  Learning Barriers/Preferences:  Learning Barriers/Preferences - 12/03/23  1226       Learning Barriers/Preferences   Learning Barriers None    Learning Preferences Individual Instruction;Skilled Demonstration             Education Topics:    Functional Fitness:  -Group instruction provided by verbal instruction, demonstration, patient participation, and written materials to support subject.  Instructors address safety measures for doing things around the house.  Discuss how to get up and down off the floor, how to pick things up properly, how to safely get out of a chair without assistance, and balance training.   Meditation and Mindfulness:  -Group instruction provided by verbal instruction, patient  participation, and written materials to support subject.  Instructor addresses importance of mindfulness and meditation practice to help reduce stress and improve awareness.  Instructor also leads participants through a meditation exercise.    Stretching for Flexibility and Mobility:  -Group instruction provided by verbal instruction, patient participation, and written materials to support subject.  Instructors lead participants through series of stretches that are designed to increase flexibility thus improving mobility.  These stretches are additional exercise for major muscle groups that are typically performed during regular warm up and cool down.  Stress Management:  -Group instruction provided by verbal instruction, video, and written materials to support subject matter.  Instructors review role of stress in heart disease and how to cope with stress positively.     Exercising on Your Own:  -Group instruction provided by verbal instruction, power point, and written materials to support subject.  Instructors discuss benefits of exercise, components of exercise, frequency and intensity of exercise, and end points for exercise.  Also discuss use of nitroglycerin and activating EMS.  Review options of places to exercise outside of rehab.  Review guidelines for sex with heart disease.   Anatomy and Physiology of the Circulatory System:  Group verbal and written instruction and models provide basic cardiac anatomy and physiology, with the coronary electrical and arterial systems. Review of: AMI, Angina, Valve disease, Heart Failure, Peripheral Artery Disease, Cardiac Arrhythmia, Pacemakers, and the ICD.  Other Education:  -Group or individual verbal, written, or video instructions that support the educational goals of the cardiac rehab program.     ITP Comments:  ITP Comments     Row Name 12/03/23 1050           ITP Comments Dr. Kirke Corin referring/supervising SET provider                 Comments: [name] came to orientation and performed her TM test without unusual signs or symptoms. Pt is extremely motivated to improve her claudication.Clenton Pare plans to return tomorrow for her first day of exercise. She will participate in the 1015 class.   [id number]

## 2023-12-04 ENCOUNTER — Ambulatory Visit (HOSPITAL_COMMUNITY)

## 2023-12-04 ENCOUNTER — Encounter (HOSPITAL_COMMUNITY)
Admission: RE | Admit: 2023-12-04 | Discharge: 2023-12-04 | Disposition: A | Discharge status: Home / Self Care | Source: Ambulatory Visit | Attending: Cardiovascular Disease | Admitting: Cardiovascular Disease

## 2023-12-04 DIAGNOSIS — I70211 Atherosclerosis of native arteries of extremities with intermittent claudication, right leg: Secondary | ICD-10-CM | POA: Diagnosis not present

## 2023-12-04 NOTE — Progress Notes (Signed)
 Daily Session Note  Patient Details  Name: [name] MRN: [id number] Date of Birth: May 15, 1941 Referring Provider:   Doristine Devoid Supervised Exercise Therapy from 12/03/2023 in [facility]  Referring Provider Dr. Lorine Bears       Encounter Date: 12/04/2023  Check In:  Session Check In - 12/04/23 1147       Check-In   Supervising physician immediately available to respond to emergencies [facility] MD immediately available    Physician(s) Reather Littler, NP    Location [facility]    Staff Present Kary Kos, MS, Exercise Physiologist;Maria Whitaker, RN, Marton Redwood, MS, ACSM-CEP, CCRP, Exercise Physiologist;Olinty Peggye Pitt, MS, ACSM-CEP, Exercise Physiologist    Virtual Visit No    Medication changes reported     No    Fall or balance concerns reported    No    Tobacco Cessation No Change    Warm-up and Cool-down Performed as group-led instruction    Resistance Training Performed Yes    VAD Patient? No    PAD/SET Patient? Yes      PAD/SET Patient   Completed foot check today? Yes    Open wounds to report? No      Pain Assessment   Currently in Pain? No/denies    Multiple Pain Sites No             Capillary Blood Glucose: No results found for this or any previous visit (from the past 24 hours).   Exercise Prescription Changes - 12/04/23 1100       Response to Exercise   Blood Pressure (Admit) 130/46    Blood Pressure (Exercise) 122/46    Blood Pressure (Exit) 96/48    Heart Rate (Admit) 70 bpm    Heart Rate (Exercise) 88 bpm    Heart Rate (Exit) 70 bpm    Oxygen Saturation (Admit) 96 %    Oxygen Saturation (Exercise) 96 %    Oxygen Saturation (Exit) 91 %    Rating of Perceived Exertion (Exercise) 11    Perceived Dyspnea (Exercise) 0    Symptoms 2/5 claudication    Comments Pt first day    Duration Progress to 30 minutes of  aerobic without signs/symptoms of physical  distress    Intensity THRR unchanged      Progression   Progression Continue to follow PAD protocol    Average METs 2.6      Resistance Training   Training Prescription Yes    Weight 2    Reps 10-15      Treadmill   MPH 1.2    Grade 4    Minutes 45    METs 2.6             Social History   Tobacco Use  Smoking Status Former  Smokeless Tobacco Never  Tobacco Comments   QUIT 45+ YEARS AGO    Goals Met:  Exercise tolerated well No report of concerns or symptoms today Strength training completed today  Goals Unmet:  Not Applicable  Comments: Pt was cleared for supervised exercise therapy by Dr. Lorine Bears  Due to patients hx of CV disease pt is being watched closely and monitored for any unusual signs/symptoms while exercising. Will continue to watch closely, monitor, and report any unusual or unexpected pain through the program.  Pt exercised on the Treadmill  for 45 minutes and experienced 3/5 claudication pain that resolved with sitting rest breaks. Pt tolerated exercise  w/o unusual and unexpected symptoms. Will continue to monitor and progress through the program following PAD protocol.   Will increase incline by 1% next session.    Initial onset of claudication at: 6:22 Longest exercised time before break: 30 Total exercise time/rest time:  45/3  Service time is from 1015 to 1150.    Dr. Armanda Magic is Medical Director for Cardiac Rehab at [facility].

## 2023-12-07 ENCOUNTER — Encounter (HOSPITAL_COMMUNITY)
Admission: RE | Admit: 2023-12-07 | Discharge: 2023-12-07 | Disposition: A | Discharge status: Home / Self Care | Source: Ambulatory Visit | Attending: Cardiovascular Disease | Admitting: Cardiovascular Disease

## 2023-12-07 DIAGNOSIS — I70211 Atherosclerosis of native arteries of extremities with intermittent claudication, right leg: Secondary | ICD-10-CM

## 2023-12-07 NOTE — Progress Notes (Signed)
 Daily Session Note  Patient Details  Name: [name] MRN: [id number] Date of Birth: 09-01-1941 Referring Provider:   Doristine Devoid Supervised Exercise Therapy from 12/03/2023 in [facility]  Referring Provider Dr. Lorine Bears       Encounter Date: 12/07/2023  Check In:  Session Check In - 12/07/23 1155       Check-In   Supervising physician immediately available to respond to emergencies [facility] MD immediately available    Physician(s) Eligha Bridegroom, NP    Location [facility]    Staff Present Kary Kos, MS, Exercise Physiologist;Maria Whitaker, RN, Marton Redwood, MS, ACSM-CEP, CCRP, Exercise Physiologist;Bailey Wallace Cullens, MS, Exercise Physiologist;Casey Katrinka Blazing, RT    Virtual Visit No    Medication changes reported     No    Fall or balance concerns reported    No    Tobacco Cessation No Change    Warm-up and Cool-down Performed as group-led instruction    Resistance Training Performed Yes    VAD Patient? No    PAD/SET Patient? Yes      PAD/SET Patient   Open wounds to report? No      Pain Assessment   Currently in Pain? No/denies    Multiple Pain Sites No             Capillary Blood Glucose: No results found for this or any previous visit (from the past 24 hours).   Exercise Prescription Changes - 12/07/23 1100       Response to Exercise   Blood Pressure (Admit) 118/56    Blood Pressure (Exercise) 122/50    Blood Pressure (Exit) 110/54    Heart Rate (Admit) 71 bpm    Heart Rate (Exercise) 89 bpm    Heart Rate (Exit) 71 bpm    Oxygen Saturation (Admit) 96 %    Oxygen Saturation (Exercise) 96 %    Oxygen Saturation (Exit) 94 %    Rating of Perceived Exertion (Exercise) 11.5    Perceived Dyspnea (Exercise) 0    Symptoms 2/5 claduication    Comments Pt increase incline by 1%    Duration Progress to 10 minutes continuous walking  at current work load and total walking time  to 30-45 min    Intensity THRR unchanged      Progression   Progression Continue to follow PAD protocol    Average METs 2.7      Resistance Training   Training Prescription Yes    Weight 3    Reps 10-15      Treadmill   MPH 1.2    Grade 5    Minutes 30    METs 2.7             Social History   Tobacco Use  Smoking Status Former  Smokeless Tobacco Never  Tobacco Comments   QUIT 45+ YEARS AGO    Goals Met:  Exercise tolerated well No report of concerns or symptoms today Strength training completed today  Goals Unmet:  Not Applicable  Comments: Pt was cleared for supervised exercise therapy by Dr. Lorine Bears  Due to patients hx of CV disease pt is being watched closely and monitored for any unusual signs/symptoms while exercising. Will continue to watch closely, monitor, and report any unusual or unexpected pain through the program.  Pt exercised on the Treadmill  for 30 minutes and experienced 2/5 claudication pain that resolved with sitting rest breaks. Pt tolerated  exercise w/o unusual and unexpected symptoms. Will continue to monitor and progress through the program following PAD protocol.    Initial onset of claudication at: 14:30 Longest exercised time before break: 23 Total exercise time/rest time: 30/2  Service time is from 1015 to 1140.    Dr. Armanda Magic is Medical Director for Cardiac Rehab at [facility]

## 2023-12-11 ENCOUNTER — Encounter (HOSPITAL_COMMUNITY)

## 2023-12-14 ENCOUNTER — Encounter (HOSPITAL_COMMUNITY)

## 2023-12-16 ENCOUNTER — Other Ambulatory Visit: Payer: Self-pay | Admitting: Student

## 2023-12-16 ENCOUNTER — Ambulatory Visit
Admission: RE | Admit: 2023-12-16 | Discharge: 2023-12-16 | Disposition: A | Discharge status: Home / Self Care | Source: Ambulatory Visit | Attending: Student | Admitting: Student

## 2023-12-16 DIAGNOSIS — R1084 Generalized abdominal pain: Secondary | ICD-10-CM

## 2023-12-18 ENCOUNTER — Encounter (HOSPITAL_COMMUNITY)
Admission: RE | Admit: 2023-12-18 | Discharge: 2023-12-18 | Disposition: A | Discharge status: Home / Self Care | Source: Ambulatory Visit | Attending: Cardiovascular Disease | Admitting: Cardiovascular Disease

## 2023-12-18 DIAGNOSIS — I70211 Atherosclerosis of native arteries of extremities with intermittent claudication, right leg: Secondary | ICD-10-CM | POA: Insufficient documentation

## 2023-12-18 NOTE — Progress Notes (Signed)
 Daily Session Note  Patient Details  Name: [name] MRN: [id number] Date of Birth: 25-Oct-1940 Referring Provider:   Doristine Devoid Supervised Exercise Therapy from 12/03/2023 in [facility]  Referring Provider Dr. Lorine Bears       Encounter Date: 12/18/2023  Check In:  Session Check In - 12/18/23 1150       Check-In   Supervising physician immediately available to respond to emergencies [facility] immediately available    Physician(s) Anola Gurney NP    [facility]    Staff Present Kary Kos, MS, Exercise Physiologist;Maria Whitaker, RN, Marton Redwood, MS, ACSM-CEP, CCRP, Exercise Physiologist;Bailey Wallace Cullens, MS, Exercise Physiologist;Jetta Walker BS, ACSM-CEP, Exercise Physiologist    Virtual Visit No    Medication changes reported     No    Fall or balance concerns reported    No    Tobacco Cessation No Change    Warm-up and Cool-down Performed as group-led instruction    Resistance Training Performed Yes    VAD Patient? No    PAD/SET Patient? Yes      PAD/SET Patient   Completed foot check today? Yes    Open wounds to report? No      Pain Assessment   Currently in Pain? No/denies    Multiple Pain Sites No             Capillary Blood Glucose: No results found for this or any previous visit (from the past 24 hours).    Social History   Tobacco Use  Smoking Status Former  Smokeless Tobacco Never  Tobacco Comments   QUIT 45+ YEARS AGO    Goals Met:  Exercise tolerated well No report of concerns or symptoms today Strength training completed today  Goals Unmet:  Not Applicable  Comments: Pt was cleared for supervised exercise therapy by Dr. Lorine Bears  Due to patients hx of CV disease pt is being watched closely and monitored for any unusual signs/symptoms while exercising. Will continue to watch closely, monitor, and report any unusual or unexpected  pain through the program.  Pt exercised on the Treadmill  for 30 minutes and experienced 1/5 claudication pain that resolved with sitting rest breaks. Pt tolerated exercise w/o unusual and unexpected symptoms. Will continue to monitor and progress through the program following PAD protocol.   Pt did not report claudication on 1.2/5.0 for 20 minutes. I adjusted the incline to 6.5% which she completed for 10 minutes. Pt reports that she experienced "a little pressure". Will start at 6.5% on Monday.  Service time is from 1015 to 1145.    Dr. Armanda Magic is Medical Director for Cardiac Rehab at [facility].

## 2023-12-21 ENCOUNTER — Encounter (HOSPITAL_COMMUNITY)
Admission: RE | Admit: 2023-12-21 | Discharge: 2023-12-21 | Disposition: A | Discharge status: Home / Self Care | Source: Ambulatory Visit | Attending: Cardiovascular Disease

## 2023-12-21 DIAGNOSIS — I70211 Atherosclerosis of native arteries of extremities with intermittent claudication, right leg: Secondary | ICD-10-CM

## 2023-12-21 NOTE — Progress Notes (Signed)
 Daily Session Note  Patient Details  Name: [name] MRN: [id number] Date of Birth: [birth year] Referring Provider:   Doristine Devoid Supervised Exercise Therapy from 12/03/2023 in [facility]  Referring Provider Dr. Lorine Bears       Encounter Date: 12/21/2023  Check In:  Session Check In - 12/21/23 1154       Check-In   Supervising physician immediately available to respond to emergencies [facility] immediately available    Physician(s) Anola Gurney NP    Location [facility]    Staff Present Kary Kos, MS, Exercise Physiologist;Maria Harlon Flor, RN, Cathlean Cower, MS, Exercise Physiologist;Jetta Dan Humphreys BS, ACSM-CEP, Exercise Physiologist;Olinty Peggye Pitt, MS, ACSM-CEP, Exercise Physiologist    Virtual Visit No    Medication changes reported     No    Fall or balance concerns reported    No    Tobacco Cessation No Change    Warm-up and Cool-down Performed as group-led instruction   Warm Up on TM; Weights and Cool-down as group   Resistance Training Performed Yes    VAD Patient? No    PAD/SET Patient? Yes      PAD/SET Patient   Completed foot check today? Yes    Open wounds to report? No      Pain Assessment   Currently in Pain? No/denies    Multiple Pain Sites No             Capillary Blood Glucose: No results found for this or any previous visit (from the past 24 hours).   Exercise Prescription Changes - 12/21/23 1100       Response to Exercise   Blood Pressure (Admit) 112/48    Blood Pressure (Exercise) 132/50    Blood Pressure (Exit) 108/52    Heart Rate (Admit) 77 bpm    Heart Rate (Exercise) 97 bpm    Heart Rate (Exit) 70 bpm    Oxygen Saturation (Admit) 96 %    Oxygen Saturation (Exercise) 96 %    Oxygen Saturation (Exit) 93 %    Rating of Perceived Exertion (Exercise) 11.5    Perceived Dyspnea (Exercise) 0    Symptoms 2/5 claudication, fatigue, 1-2 BL knee pain     Comments Incline increased to 6.5    Duration Progress to 10 minutes continuous walking  at current work load and total walking time to 30-45 min    Intensity THRR unchanged      Progression   Progression Continue to follow PAD protocol    Average METs 3      Resistance Training   Training Prescription Yes    Weight 3    Reps 10-15      Treadmill   MPH 1.2    Grade 6.5    Minutes 35    METs 3             Social History   Tobacco Use  Smoking Status Former  Smokeless Tobacco Never  Tobacco Comments   QUIT 45+ YEARS AGO    Goals Met:  Exercise tolerated well No report of concerns or symptoms today Strength training completed today  Goals Unmet:  Not Applicable  Comments: Pt was cleared for supervised exercise therapy by Dr. Lorine Bears  Due to patients hx of CV disease pt is being watched closely and monitored for any unusual signs/symptoms while exercising. Will continue to watch closely, monitor, and report any unusual or unexpected pain through the  program.  Pt exercised on the Treadmill  for 35 minutes and experienced 2/5 claudication pain that resolved with sitting rest breaks. Pt tolerated exercise w/o unusual and unexpected symptoms. Will continue to monitor and progress through the program following PAD protocol.    Pt tolerated session w/o unusual symptoms. Will increase incline by 0.5% next session.   Initial onset of claudication at: 13 minutes  Longest exercised time before break: 20 minutes  Total exercise time/rest time: 35/2  Service time is from 1015 to 1145.    Dr. Armanda Magic is Medical Director for Cardiac Rehab at [facility].

## 2023-12-23 ENCOUNTER — Ambulatory Visit (INDEPENDENT_AMBULATORY_CARE_PROVIDER_SITE_OTHER): Payer: Self-pay

## 2023-12-23 DIAGNOSIS — I495 Sick sinus syndrome: Secondary | ICD-10-CM

## 2023-12-24 LAB — CUP PACEART REMOTE DEVICE CHECK
Battery Remaining Longevity: 88 mo
Battery Remaining Percentage: 76 %
Battery Voltage: 3.02 V
Brady Statistic RA Percent Paced: 96 %
Date Time Interrogation Session: 20250409211935
Implantable Lead Connection Status: 753985
Implantable Lead Connection Status: 753985
Implantable Lead Implant Date: 20090617
Implantable Lead Implant Date: 20090617
Implantable Lead Location: 753859
Implantable Lead Location: 753860
Implantable Pulse Generator Implant Date: 20220406
Lead Channel Impedance Value: 390 Ohm
Lead Channel Pacing Threshold Amplitude: 0.75 V
Lead Channel Pacing Threshold Pulse Width: 0.4 ms
Lead Channel Sensing Intrinsic Amplitude: 4.9 mV
Lead Channel Setting Pacing Amplitude: 2 V
Pulse Gen Model: 2272
Pulse Gen Serial Number: 3911946

## 2023-12-25 ENCOUNTER — Encounter (HOSPITAL_COMMUNITY)
Admission: RE | Admit: 2023-12-25 | Discharge: 2023-12-25 | Disposition: A | Discharge status: Home / Self Care | Source: Ambulatory Visit | Attending: Cardiovascular Disease | Admitting: Cardiovascular Disease

## 2023-12-25 DIAGNOSIS — I70211 Atherosclerosis of native arteries of extremities with intermittent claudication, right leg: Secondary | ICD-10-CM

## 2023-12-25 NOTE — Progress Notes (Signed)
 Daily Session Note  Patient Details  Name: [name] MRN: [id number] Date of Birth: [birth year] Referring Provider:   Doristine Devoid Supervised Exercise Therapy from 12/03/2023 in [facility]  Referring Provider Dr. Lorine Bears       Encounter Date: 12/25/2023  Check In:  Session Check In - 12/25/23 1338       Check-In   Supervising physician immediately available to respond to emergencies [facility] immediately available    Physician(s) Robin Searing, NP    [facility]    Staff Present Gladstone Lighter, RN, BSN;Jetta Walker BS, ACSM-CEP, Exercise Physiologist;Olinty Peggye Pitt, MS, ACSM-CEP, Exercise Physiologist;Alben Jepsen Earlene Plater, MS, ACSM-CEP, Exercise Physiologist;David Makemson, MS, ACSM-CEP, CCRP, Exercise Physiologist    Virtual Visit No    Medication changes reported     No    Fall or balance concerns reported    No    Tobacco Cessation No Change    Warm-up and Cool-down Performed as group-led instruction   Warm Up on TM; Weights and Cool-down as group   Resistance Training Performed Yes    VAD Patient? No    PAD/SET Patient? Yes      PAD/SET Patient   Completed foot check today? Yes    Open wounds to report? No      Pain Assessment   Currently in Pain? No/denies    Multiple Pain Sites No             Capillary Blood Glucose: No results found for this or any previous visit (from the past 24 hours).   Exercise Prescription Changes - 12/25/23 1300       Response to Exercise   Blood Pressure (Admit) 112/56    Blood Pressure (Exercise) 130/60    Blood Pressure (Exit) 100/56    Heart Rate (Admit) 74 bpm    Heart Rate (Exercise) 94 bpm    Heart Rate (Exit) 71 bpm    Oxygen Saturation (Admit) 96 %    Oxygen Saturation (Exercise) 96 %    Oxygen Saturation (Exit) 95 %    Rating of Perceived Exertion (Exercise) 11.5    Perceived Dyspnea (Exercise) 0    Symptoms 1.5/5 claudication,  fatigue, 1.5 BL knee pain    Comments Incline increased to 7    Duration Progress to 10 minutes continuous walking  at current work load and total walking time to 30-45 min    Intensity THRR unchanged      Progression   Progression Continue to follow PAD protocol    Average METs 3.2      Resistance Training   Training Prescription Yes    Weight 3    Reps 10-15    Time 10 Minutes      Treadmill   MPH 1.4    Grade 7    Minutes 30    METs 3.2             Social History   Tobacco Use  Smoking Status Former  Smokeless Tobacco Never  Tobacco Comments   QUIT 45+ YEARS AGO    Goals Met:  Exercise tolerated well No report of concerns or symptoms today Strength training completed today  Goals Unmet:  Not Applicable  Comments: Pt was cleared for supervised exercise therapy by Dr. Lorine Bears  Due to patients hx of CV disease pt is being watched closely and monitored for any unusual signs/symptoms while exercising. Will continue to watch closely, monitor,  and report any unusual or unexpected pain through the program.   Pt exercised on the Treadmill  for 30 minutes and experienced 1.5/5 claudication pain that resolved with sitting rest breaks. Pt tolerated exercise w/o unusual and unexpected symptoms. Will continue to monitor and progress through the program following PAD protocol.    Pt tolerated session w/o unusual symptoms.   Initial onset of claudication at: 17 minutes  Longest exercised time before break: 30 minutes  Total exercise time/rest time: 30/4   Service time is from 1015 to 1145.  Dr. Armanda Magic is Medical Director for Cardiac Rehab at [facility].

## 2023-12-28 ENCOUNTER — Encounter (HOSPITAL_COMMUNITY)
Admission: RE | Admit: 2023-12-28 | Discharge: 2023-12-28 | Disposition: A | Discharge status: Home / Self Care | Source: Ambulatory Visit | Attending: Cardiovascular Disease | Admitting: Cardiovascular Disease

## 2023-12-28 DIAGNOSIS — I70211 Atherosclerosis of native arteries of extremities with intermittent claudication, right leg: Secondary | ICD-10-CM | POA: Diagnosis not present

## 2023-12-28 NOTE — Progress Notes (Signed)
 Daily Session Note  Patient Details  Name: [name] MRN: [id number] Date of Birth: 07-08-1941 Referring Provider:   [name] Supervised Exercise Therapy from 12/03/2023 in [facility]  Referring Provider Dr. Antionette Kirks       Encounter Date: 12/28/2023  Check In:  Session Check In - 12/28/23 1204       Check-In   Supervising physician immediately available to respond to emergencies [facility] MD immediately available    Physician(s) Palmer Bobo, NP    [facility]    Staff Present Joann Mu, RN, BSN;Jetta Walker BS, ACSM-CEP, Exercise Physiologist;Olinty Gaylene Kays, MS, ACSM-CEP, Exercise Physiologist;Yigit Norkus Nolon Baxter, MS, ACSM-CEP, Exercise Physiologist;David Makemson, MS, ACSM-CEP, CCRP, Exercise Physiologist    Virtual Visit No    Medication changes reported     No    Fall or balance concerns reported    No    Tobacco Cessation No Change    Warm-up and Cool-down Performed as group-led instruction   Warm Up on TM; Weights and Cool-down as group   Resistance Training Performed Yes    VAD Patient? No    PAD/SET Patient? Yes      PAD/SET Patient   Completed foot check today? Yes    Open wounds to report? No      Pain Assessment   Currently in Pain? No/denies    Multiple Pain Sites No             Capillary Blood Glucose: No results found for this or any previous visit (from the past 24 hours).   Exercise Prescription Changes - 12/28/23 1200       Response to Exercise   Blood Pressure (Admit) 118/58    Blood Pressure (Exercise) 114/60    Blood Pressure (Exit) 110/56    Heart Rate (Admit) 71 bpm    Heart Rate (Exercise) 91 bpm    Heart Rate (Exit) 73 bpm    Oxygen Saturation (Admit) 95 %    Oxygen Saturation (Exercise) 96 %    Oxygen Saturation (Exit) 95 %    Rating of Perceived Exertion (Exercise) 13    Perceived Dyspnea (Exercise) 0    Symptoms 3/5 claudication     Comments Incline increased to 7.5    Duration Progress to 30 minutes of  aerobic without signs/symptoms of physical distress    Intensity THRR unchanged      Progression   Progression Continue to follow PAD protocol      Resistance Training   Training Prescription Yes    Weight 3    Reps 10-15    Time 10 Minutes      Treadmill   MPH 1.4    Grade 7.5    Minutes 28    METs 3.5             Social History   Tobacco Use  Smoking Status Former  Smokeless Tobacco Never  Tobacco Comments   QUIT 45+ YEARS AGO    Goals Met:  Proper associated with RPD/PD & O2 Sat Exercise tolerated well No report of concerns or symptoms today Strength training completed today  Goals Unmet:  Not Applicable  Comments: Pt was cleared for supervised exercise therapy by Dr. Antionette Kirks  Due to patients hx of CV disease pt is being watched closely and monitored for any unusual signs/symptoms while exercising. Will continue to watch closely, monitor, and report any unusual or unexpected pain through the program.  Pt exercised on the Treadmill  for 40 minutes and experienced 3/5 claudication pain that resolved with sitting rest breaks. Pt tolerated exercise w/o unusual and unexpected symptoms. Will continue to monitor and progress through the program following PAD protocol.    Pt tolerated session w/o unusual symptoms.    Initial onset of claudication at: 8 minutes  Longest exercised time before break: 208 minutes  Total exercise time/rest time: 40/4   Service time is from 1025 to 1145.   Dr. Gaylyn Keas is Medical Director for Cardiac Rehab at [facility].

## 2023-12-29 NOTE — Progress Notes (Signed)
 PAD/SET Plan of Care Updates  Patient Details  Name: [name] MRN: [id number] Date of Birth: 11-08-1940  Entry Date:  Flowsheet Row Supervised Exercise Therapy from 12/03/2023 in [facility]  Date 12/03/23      Expected Discharge Date:  Flowsheet Row Supervised Exercise Therapy from 12/03/2023 in [facility], [facility], [facility] Lung [facility]  Expected Discharge Date 02/19/24       Medical Diagnosis: 11/12/23 Atherosclerosis of native artery of right lower extremity with intermittent claudication (HCC)  Encounter Date: 12/29/23  Exercise Target Goals: Exercise Program Goal: Individual exercise prescription set with THRR, safety & activity barriers. Participant demonstrates ability to understand and report RPE using RPE 6-20 scale, to self-measure pulse accurately, and to acknowledge the importance of the exercise prescription.  Exercise Prescription Goal: Use initial exercise assessment to set exercise prescription to improve time and distance to onset of claudication pain. Provide education to aid in steps toward risk factor modification, improve quality of life with claudication, and utilize THRR and exercise prescription for best results for decreasing symptoms of PAD. Prevent injury to bone, joints, and skin integrity during the exercise through checks of each system during session check in. Following physician guidelines for any contraindications to specific exercises.  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 Antionette Kirks    Expected Discharge Date 02/19/24      Treadmill   MPH 1.2    Grade 4    Minutes 20    METs 2.6      Prescription Details   Frequency (times per week) 2 days in program, 1 day at home    Duration Progress to 10 minutes continuous walking  at current work load and total walking time to 30-45 min      Intensity   THRR 40-80% of Max Heartrate 55-110    Ratings of Perceived Exertion 11-13    Perceived Dyspnea 0-4      Progression   Progression Continue to follow PAD protocol      Resistance Training   Training Prescription Yes    Weight 2    Reps 10-15             Perform Capillary Blood Glucose checks as needed.  Current Exercise Prescription:   Exercise Prescription Changes     Row Name 12/04/23 1100 12/07/23 1100 12/21/23 1100 12/25/23 1300 12/28/23 1200     Response to Exercise   Blood Pressure (Admit) 130/46 118/56 112/48 112/56 118/58   Blood Pressure (Exercise) 122/46 122/50 132/50 130/60 114/60   Blood Pressure (Exit) 96/48 110/54 108/52 100/56 110/56   Heart Rate (Admit) 70 bpm 71 bpm 77 bpm 74 bpm 71 bpm   Heart Rate (Exercise) 88 bpm 89 bpm 97 bpm 94 bpm 91 bpm   Heart Rate (Exit) 70 bpm 71 bpm 70 bpm 71 bpm 73 bpm   Oxygen Saturation (Admit)  96 % 96 % 96 % 96 % 95 %   Oxygen Saturation (Exercise) 96 % 96 % 96 % 96 % 96 %   Oxygen Saturation (Exit) 91 % 94 % 93 % 95 % 95 %   Rating of Perceived Exertion (Exercise) 11 11.5 11.5 11.5 13   Perceived Dyspnea (Exercise) 0 0 0 0 0   Symptoms 2/5 claudication 2/5 claduication 2/5 claudication, fatigue, 1-2 BL knee pain 1.5/5 claudication, fatigue, 1.5 BL knee pain 3/5 claudication   Comments Pt first day Pt increase incline by 1% Incline increased to 6.5 Incline increased to 7 Incline increased to 7.5   Duration Progress to 30 minutes of  aerobic without signs/symptoms of physical  distress Progress to 10 minutes continuous walking  at current work load and total walking time to 30-45 min Progress to 10 minutes continuous walking  at current work load and total walking time to 30-45 min Progress to 10 minutes continuous walking  at current work load and total walking time to 30-45 min Progress to 30 minutes of  aerobic without signs/symptoms of physical distress   Intensity THRR unchanged THRR unchanged THRR unchanged THRR unchanged THRR unchanged     Progression   Progression Continue to follow PAD protocol Continue to follow PAD protocol Continue to follow PAD protocol Continue to follow PAD protocol Continue to follow PAD protocol   Average METs 2.6 2.7 3 3.2 --     Resistance Training   Training Prescription Yes Yes Yes Yes Yes   Weight 2 3 3 3 3    Reps 10-15 10-15 10-15 10-15 10-15   Time -- -- -- 10 Minutes 10 Minutes     Treadmill   MPH 1.2 1.2 1.2 1.4 1.4   Grade 4 5 6.5 7 7.5   Minutes 45 30 35 30 28   METs 2.6 2.7 3 3.2 3.5            Exercise Comments:   Exercise Comments     Row Name 12/03/23 1212 12/04/23 1154 12/07/23 1203 12/21/23 1211     Exercise Comments Pt orientation, pt completed TM test. All VSS after test. Pt first day in program Pt increased incline to 5%, pt tolerated change well without unsual symptoms. Will continue to monitor progress and adjust WL as tolerated. Pt exercised on the Treadmill  for 35 minutes and experienced 2/5 claudication pain that resolved with sitting rest breaks. Pt tolerated exercise w/o unusual and unexpected symptoms. Will continue to monitor and progress through the program following PAD protocol.      Pt tolerated session w/o unusual symptoms. Will increase incline by 0.5% next session.     Initial onset of claudication at: 13 minutes   Longest exercised time before break: 20 minutes   Total exercise time/rest time: 35/2             Program and Patient Goals Exercise Goals and Review:   Exercise  Goals     Row Name 12/03/23 1211             Exercise Goals   Increase Physical Activity Yes       Intervention Provide advice, education, support and counseling about physical activity/exercise needs.;Develop an individualized exercise prescription for aerobic and resistive training based on initial evaluation findings, risk stratification, comorbidities and participant's personal goals.       Expected Outcomes Short Term: Attend rehab on a regular basis to increase amount of physical activity.;Long Term: Exercising regularly at least  3-5 days a week.;Long Term: Add in home exercise to make exercise part of routine and to increase amount of physical activity.       Increase Strength and Stamina Yes       Intervention Provide advice, education, support and counseling about physical activity/exercise needs.;Develop an individualized exercise prescription for aerobic and resistive training based on initial evaluation findings, risk stratification, comorbidities and participant's personal goals.       Expected Outcomes Short Term: Increase workloads from initial exercise prescription for resistance, speed, and METs.;Short Term: Perform resistance training exercises routinely during rehab and add in resistance training at home;Long Term: Improve cardiorespiratory fitness, muscular endurance and strength as measured by increased METs and functional capacity ( )       Able to understand and use rate of perceived exertion (RPE) scale Yes       Intervention Provide education and explanation on how to use RPE scale       Expected Outcomes Short Term: Able to use RPE daily in rehab to express subjective intensity level;Long Term:  Able to use RPE to guide intensity level when exercising independently       Knowledge and understanding of Target Heart Rate Range (THRR) Yes       Intervention Provide education and explanation of THRR including how the numbers were predicted and where they are located for  reference       Expected Outcomes Short Term: Able to state/look up THRR;Long Term: Able to use THRR to govern intensity when exercising independently;Short Term: Able to use daily as guideline for intensity in rehab       Understanding of Exercise Prescription Yes       Intervention Provide education, explanation, and written materials on patient's individual exercise prescription       Expected Outcomes Short Term: Able to explain program exercise prescription;Long Term: Able to explain home exercise prescription to exercise independently       Improve claudication pain toleration; Improve walking ability Yes       Intervention Participate in PAD/SET Rehab 2-3 days a week, walking at home as part of exercise prescription;Attend education sessions to aid in risk factor modification and understanding of disease process       Expected Outcomes Long Term: Improve score of PAD questionnaires;Long Term: Improve walking ability and toleration to claudication;Short Term: Improve walking distance/time to onset of claudication pain                Nutrition:  Target Goals: Understanding of nutrition guidelines, daily intake of sodium 1500mg , cholesterol 200mg , calories 30% from fat and 7% or less from saturated fats, daily to have 5 or more servings of fruits and vegetables.  Biometrics:  Pre Biometrics - 12/03/23 1202       Pre Biometrics   Waist Circumference 38 inches    Hip Circumference 40 inches    Waist to Hip Ratio 0.95 %    Triceps Skinfold 7 mm    % Body Fat 33.3 %    Grip Strength 14 kg    Flexibility 12.5 in    Single Leg Stand 19.8 seconds              Nutrition Therapy Plan and Nutrition Goals:   Core Components/Risk Factors/Patient Goals at Admission:   ITP Comments:  ITP Comments     Row Name 12/03/23 1050           ITP Comments Dr. Kirke Corin referring/supervising SET provider  Comments: Pt progressing well with SET, she has not noticed  improvement when walking uphill in the park. I believe this may be due to her walking up a steeper incline than what she is doing in the program. Therefore I will continue to increase incline until she is having 3/5 claudication within 8-10 minutes and progress from there. Currently she is walking at 1.4/7.5 equating to 3.5 METs (moderate intensity). I believe more SET sessions are warranted to improve claudication with uphill exercise.   Physician Documentation: Your signature/cosign is required to indicate approval of the Care Plan as stated above. By signing this report, you are approving the Plan of Care. Please sign and either send electronically or print and fax the signed copy to the number below. Please indicate any changes or limitations in the space provided.   ____________________________________________________________________________________________________________________________________________  Physician Signature: _____________________________________________________ Print Physician Name: ____________________________________________________ Date: ___________________________ Time: _________________________________  [facility]  [place]. [facility] [place]. [facility] for [facility], Vascular and Pulmonary Health [address] [place], [place] [address] Phone: [phone] Fax: [phone]

## 2024-01-01 ENCOUNTER — Encounter (HOSPITAL_COMMUNITY)
Admission: RE | Admit: 2024-01-01 | Discharge: 2024-01-01 | Disposition: A | Discharge status: Home / Self Care | Source: Ambulatory Visit | Attending: Cardiovascular Disease | Admitting: Cardiovascular Disease

## 2024-01-01 DIAGNOSIS — I70211 Atherosclerosis of native arteries of extremities with intermittent claudication, right leg: Secondary | ICD-10-CM | POA: Diagnosis not present

## 2024-01-01 NOTE — Progress Notes (Addendum)
 Daily Session Note  Patient Details  Name: [name] MRN: [id number] Date of Birth: [birth year] Referring Provider:   [name] Supervised Exercise Therapy from 12/03/2023 in [facility] for Heart, Vascular, & Lung Health  Referring Provider Dr. Antionette Kirks       Encounter Date: 01/01/2024  Check In:  Session Check In - 01/01/24 1148       Check-In   Supervising physician immediately available to respond to emergencies [facility] MD immediately available    Physician(s) Charles Connor, NP    Location [facility]    Staff Present Joann Mu, RN, BSN;Jetta Walker BS, ACSM-CEP, Exercise Physiologist;Lem Peary Nolon Baxter, MS, ACSM-CEP, Exercise Physiologist;David Makemson, MS, ACSM-CEP, CCRP, Exercise Physiologist;Johnny Alexia Angelucci, MS, Exercise Physiologist;Mary Arlester Ladd, RN, Emerick Hanlon, MS, Exercise Physiologist    Virtual Visit No    Medication changes reported     No    Fall or balance concerns reported    No    Tobacco Cessation No Change    Warm-up and Cool-down Performed as group-led instruction   Warm Up on TM; Weights and Cool-down as group   Resistance Training Performed Yes    VAD Patient? No    PAD/SET Patient? No      PAD/SET Patient   Completed foot check today? Yes    Open wounds to report? No      Pain Assessment   Currently in Pain? No/denies    Multiple Pain Sites No             Capillary Blood Glucose: No results found for this or any previous visit (from the past 24 hours).   Exercise Prescription Changes - 01/01/24 1100       Response to Exercise   Blood Pressure (Admit) 116/52    Blood Pressure (Exercise) 122/60    Blood Pressure (Exit) 110/58    Heart Rate (Admit) 95 bpm    Heart Rate (Exercise) 91 bpm    Heart Rate (Exit) 80 bpm    Oxygen Saturation (Admit) 97 %    Oxygen Saturation (Exercise) 94 %    Oxygen Saturation (Exit) 93 %    Rating of Perceived Exertion (Exercise) 12    Perceived Dyspnea  (Exercise) 0    Symptoms 1.5/5 claudication    Comments Incline increased to 9    Duration Progress to 30 minutes of  aerobic without signs/symptoms of physical distress    Intensity THRR unchanged      Progression   Progression Continue to follow PAD protocol      Resistance Training   Training Prescription Yes    Weight 3    Reps 10-15    Time 10 Minutes      Treadmill   MPH 1.5    Grade 9    Minutes 30    METs 3.8             Social History   Tobacco Use  Smoking Status Former  Smokeless Tobacco Never  Tobacco Comments   QUIT 45+ YEARS AGO    Goals Met:  Proper associated with RPD/PD & O2 Sat Independence with exercise equipment Exercise tolerated well No report of concerns or symptoms today Strength training completed today  Goals Unmet:  Not Applicable  Comments: Pt was cleared for supervised exercise therapy by Dr. Antionette Kirks  Due to patients hx of CV disease pt is being watched closely and monitored for any unusual signs/symptoms while exercising. Will continue to watch closely, monitor,  and report any unusual or unexpected pain through the program.   Pt exercised on the Treadmill  for 30 minutes and experienced 1.5/5 claudication pain that resolved with sitting rest breaks. Pt tolerated exercise w/o unusual and unexpected symptoms. Will continue to monitor and progress through the program following PAD protocol.    Pt tolerated session w/o unusual symptoms.    Initial onset of claudication at: 13 minutes  Longest exercised time before break: 30 minutes  Total exercise time/rest time: 30  Service time is from 1025 to 1145.   Dr. Gaylyn Keas is Medical Director for Cardiac Rehab at [facility]

## 2024-01-02 ENCOUNTER — Encounter: Payer: Self-pay | Admitting: Internal Medicine

## 2024-01-04 ENCOUNTER — Encounter (HOSPITAL_COMMUNITY)
Admission: RE | Admit: 2024-01-04 | Discharge: 2024-01-04 | Disposition: A | Discharge status: Home / Self Care | Source: Ambulatory Visit | Attending: Cardiovascular Disease | Admitting: Cardiovascular Disease

## 2024-01-04 DIAGNOSIS — I70211 Atherosclerosis of native arteries of extremities with intermittent claudication, right leg: Secondary | ICD-10-CM | POA: Diagnosis not present

## 2024-01-04 NOTE — Progress Notes (Signed)
 Daily Session Note  Patient Details  Name: [name] MRN: [id number] Date of Birth: [birth year] Referring Provider:   [name] Supervised Exercise Therapy from 12/03/2023 in [facility] for [facility], Vascular, & Lung Health  Referring Provider Dr. Antionette Kirks       Encounter Date: 01/04/2024  Check In:  Session Check In - 01/04/24 1130       Check-In   Supervising physician immediately available to respond to emergencies [facility] MD immediately available    Physician(s) Marlana Silvan ,NP    Location [facility]    Staff Present Joann Mu, RN, BSN;Jetta Walker BS, ACSM-CEP, Exercise Physiologist;Kaylee Nolon Baxter, MS, ACSM-CEP, Exercise Physiologist;David Makemson, MS, ACSM-CEP, CCRP, Exercise Physiologist;Aydin Hink Alexia Angelucci, MS, Exercise Physiologist;Randi Regis Captain BS, ACSM-CEP, Exercise Physiologist;Olinty Gaylene Kays, MS, ACSM-CEP, Exercise Physiologist    Virtual Visit No    Medication changes reported     No    Fall or balance concerns reported    No    Tobacco Cessation No Change    Warm-up and Cool-down Performed on first and last piece of equipment   Warm Up on TM; Weights and Cool-down as group   Resistance Training Performed Yes    VAD Patient? No    PAD/SET Patient? Yes      PAD/SET Patient   Completed foot check today? Yes    Open wounds to report? No      Pain Assessment   Currently in Pain? No/denies    Multiple Pain Sites No             Capillary Blood Glucose: No results found for this or any previous visit (from the past 24 hours).   Exercise Prescription Changes - 01/04/24 1100       Response to Exercise   Blood Pressure (Admit) 120/50    Blood Pressure (Exercise) 122/56    Blood Pressure (Exit) 106/60    Heart Rate (Admit) 77 bpm    Heart Rate (Exercise) 94 bpm    Heart Rate (Exit) 70 bpm    Rating of Perceived Exertion (Exercise) 15    Perceived Dyspnea (Exercise) 0    Symptoms 3/5 claudication    Comments  Incline increased to 10.5    Duration Progress to 10 minutes continuous walking  at current work load and total walking time to 30-45 min    Intensity THRR unchanged      Progression   Progression Continue to follow PAD protocol    Average METs 4.3      Resistance Training   Training Prescription Yes    Weight 3    Reps 10-15    Time 10 Minutes      Treadmill   MPH 1.5    Grade 10.5    Minutes 31    METs 4.3             Social History   Tobacco Use  Smoking Status Former  Smokeless Tobacco Never  Tobacco Comments   QUIT 45+ YEARS AGO    Goals Met:  Independence with exercise equipment Exercise tolerated well No report of concerns or symptoms today Strength training completed today  Goals Unmet:  RPE over 13  Comments: Pt was cleared for supervised exercise therapy by Dr. Antionette Kirks  Due to patients hx of CV disease pt is being watched closely and monitored for any unusual signs/symptoms while exercising. Will continue to watch closely, monitor, and report any unusual or unexpected pain through the program.  Pt exercised on the Treadmill  for 31 minutes and experienced 3/5 claudication pain that resolved with sitting rest breaks. Pt tolerated exercise w/o unusual and unexpected symptoms. Will continue to monitor and progress through the program following PAD protocol.    Initial onset of claudication at: 4:00 Longest exercised time before break: 9:58 Total exercise time/rest time: 31/4  Service time is from 1025 to 1140.    Dr. Gaylyn Keas is Medical Director for Cardiac Rehab at [facility]

## 2024-01-08 ENCOUNTER — Encounter (HOSPITAL_COMMUNITY)
Admission: RE | Admit: 2024-01-08 | Discharge: 2024-01-08 | Disposition: A | Discharge status: Home / Self Care | Source: Ambulatory Visit | Attending: Cardiovascular Disease | Admitting: Cardiovascular Disease

## 2024-01-08 DIAGNOSIS — I70211 Atherosclerosis of native arteries of extremities with intermittent claudication, right leg: Secondary | ICD-10-CM

## 2024-01-08 NOTE — Progress Notes (Signed)
 Daily Session Note  Patient Details  Name: [name] MRN: [id number] Date of Birth: 1940/12/21 Referring Provider:   [name] Supervised Exercise Therapy from 12/03/2023 in [facility] for Heart, Vascular, & Lung Health  Referring Provider Dr. Antionette Kirks       Encounter Date: 01/08/2024  Check In:  Session Check In - 01/08/24 1245       Check-In   Supervising physician immediately available to respond to emergencies [facility] MD immediately available    Physician(s) Lawana Pray    Location [facility]    Staff Present Joann Mu, RN, BSN;Jetta Walker BS, ACSM-CEP, Exercise Physiologist;David Vernel Golds, MS, ACSM-CEP, CCRP, Exercise Physiologist;Rucker Pridgeon Alexia Angelucci, MS, Exercise Physiologist;Olinty Gaylene Kays, MS, ACSM-CEP, Exercise Physiologist;Bailey Martina Sledge, MS, Exercise Physiologist    Virtual Visit No    Medication changes reported     No    Fall or balance concerns reported    No    Tobacco Cessation No Change    Warm-up and Cool-down Performed on first and last piece of equipment   Warm Up on TM; Weights and Cool-down as group   Resistance Training Performed Yes    VAD Patient? No    PAD/SET Patient? Yes      PAD/SET Patient   Completed foot check today? Yes    Open wounds to report? No      Pain Assessment   Currently in Pain? No/denies    Multiple Pain Sites No             Capillary Blood Glucose: No results found for this or any previous visit (from the past 24 hours).    Social History   Tobacco Use  Smoking Status Former  Smokeless Tobacco Never  Tobacco Comments   QUIT 45+ YEARS AGO    Goals Met:  Independence with exercise equipment Exercise tolerated well Strength training completed today  Goals Unmet:  Not Applicable  Comments: Pt was cleared for supervised exercise therapy by Dr. Antionette Kirks  Due to patients hx of CV disease pt is being watched closely and monitored for any unusual  signs/symptoms while exercising. Will continue to watch closely, monitor, and report any unusual or unexpected pain through the program.  Pt exercised on the Treadmill  for 25 minutes and experienced 3/5 claudication pain that resolved with sitting rest breaks. Pt tolerated exercise w/o unusual and unexpected symptoms. Will continue to monitor and progress through the program following PAD protocol.    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   Service time is from 1030 to 1145.    Dr. Gaylyn Keas is Medical Director for Cardiac Rehab at [facility].

## 2024-01-11 ENCOUNTER — Encounter (HOSPITAL_COMMUNITY)

## 2024-01-15 ENCOUNTER — Encounter (HOSPITAL_COMMUNITY)
Admission: RE | Admit: 2024-01-15 | Discharge: 2024-01-15 | Disposition: A | Discharge status: Home / Self Care | Source: Ambulatory Visit | Attending: Cardiovascular Disease | Admitting: Cardiovascular Disease

## 2024-01-15 DIAGNOSIS — I70211 Atherosclerosis of native arteries of extremities with intermittent claudication, right leg: Secondary | ICD-10-CM

## 2024-01-15 NOTE — Progress Notes (Signed)
 Daily Session Note  Patient Details  Name: [name] MRN: [id number] Date of Birth: 29-Nov-1940 Referring Provider:   [name] Supervised Exercise Therapy from 12/03/2023 in [facility] for Heart, Vascular, & Lung Health  Referring Provider Dr. Antionette Kirks       Encounter Date: 01/15/2024  Check In:  Session Check In - 01/15/24 1159       Check-In   Supervising physician immediately available to respond to emergencies [facility] MD immediately available    Physician(s) Koren Persons, NP    Location [facility]    Staff Present Joann Mu, RN, BSN;Jetta Walker BS, ACSM-CEP, Exercise Physiologist;David Apison, MS, ACSM-CEP, CCRP, Exercise Physiologist;Terilynn Buresh Alexia Angelucci, MS, Exercise Physiologist;Olinty Gaylene Kays, MS, ACSM-CEP, Exercise Physiologist;Bailey Martina Sledge, MS, Exercise Physiologist    Virtual Visit No    Medication changes reported     No    Fall or balance concerns reported    No    Tobacco Cessation No Change    Warm-up and Cool-down Performed on first and last piece of equipment   Warm Up on TM; Weights and Cool-down as group   Resistance Training Performed Yes    VAD Patient? No    PAD/SET Patient? Yes      PAD/SET Patient   Completed foot check today? Yes    Open wounds to report? No      Pain Assessment   Currently in Pain? No/denies    Multiple Pain Sites No             Capillary Blood Glucose: No results found for this or any previous visit (from the past 24 hours).   Exercise Prescription Changes - 01/15/24 1200       Response to Exercise   Blood Pressure (Admit) 106/50    Blood Pressure (Exit) 112/72    Heart Rate (Admit) 72 bpm    Heart Rate (Exercise) 107 bpm    Heart Rate (Exit) 70 bpm    Rating of Perceived Exertion (Exercise) 15    Perceived Dyspnea (Exercise) 0    Symptoms 3/5 claudication    Comments incline increased to 11    Duration Progress to 10 minutes continuous walking  at current work  load and total walking time to 30-45 min    Intensity THRR unchanged      Progression   Progression Continue to follow PAD protocol    Average METs 4.7      Resistance Training   Training Prescription Yes    Weight 3    Reps 10-15    Time 10 Minutes      Treadmill   MPH 1.6    Grade 11    Minutes 35    METs 4.7             Social History   Tobacco Use  Smoking Status Former  Smokeless Tobacco Never  Tobacco Comments   QUIT 45+ YEARS AGO    Goals Met:  Independence with exercise equipment Exercise tolerated well No report of concerns or symptoms today Strength training completed today  Goals Unmet:  Not Applicable  Comments: Pt was cleared for supervised exercise therapy by Dr. Antionette Kirks  Due to patients hx of CV disease pt is being watched closely and monitored for any unusual signs/symptoms while exercising. Will continue to watch closely, monitor, and report any unusual or unexpected pain through the program.  Pt exercised on the Treadmill  for 35 minutes and experienced 3/5 claudication pain  that resolved with sitting rest breaks. Pt tolerated exercise w/o unusual and unexpected symptoms. Will continue to monitor and progress through the program following PAD protocol.    Longest exercised time before break: 11 minutes  Total exercise time/rest time: 35/10  Service time is from 1030 to 1145.    Dr. Gaylyn Keas is Medical Director for Cardiac Rehab at [facility].

## 2024-01-18 ENCOUNTER — Encounter (HOSPITAL_COMMUNITY)
Admission: RE | Admit: 2024-01-18 | Discharge: 2024-01-18 | Disposition: A | Discharge status: Home / Self Care | Source: Ambulatory Visit | Attending: Cardiovascular Disease | Admitting: Cardiovascular Disease

## 2024-01-18 ENCOUNTER — Encounter: Payer: Self-pay | Admitting: Internal Medicine

## 2024-01-18 DIAGNOSIS — I70211 Atherosclerosis of native arteries of extremities with intermittent claudication, right leg: Secondary | ICD-10-CM

## 2024-01-18 NOTE — Progress Notes (Signed)
 Daily Session Note  Patient Details  Name: [name] MRN: [id number] Date of Birth: October 05, 1940 Referring Provider:   [name] Supervised Exercise Therapy from 12/03/2023 in [facility] for Heart, Vascular, & Lung Health  Referring Provider Dr. Antionette Kirks       Encounter Date: 01/18/2024  Check In:  Session Check In - 01/18/24 1206       Check-In   Supervising physician immediately available to respond to emergencies [facility] immediately available    Physician(s) Slater Duncan, NP    Location [facility]    Staff Present Joann Mu, RN, Malvin Searing, MS, ACSM-CEP, CCRP, Exercise Physiologist;Tonie Vizcarrondo Alexia Angelucci, MS, Exercise Physiologist;Olinty Gaylene Kays, MS, ACSM-CEP, Exercise Physiologist    Virtual Visit No    Medication changes reported     No    Fall or balance concerns reported    No    Tobacco Cessation No Change    Warm-up and Cool-down Performed on first and last piece of equipment   Warm Up on TM; Weights and Cool-down as group   Resistance Training Performed Yes    VAD Patient? No    PAD/SET Patient? Yes      PAD/SET Patient   Completed foot check today? Yes    Open wounds to report? No      Pain Assessment   Currently in Pain? No/denies    Multiple Pain Sites No             Capillary Blood Glucose: No results found for this or any previous visit (from the past 24 hours).    Social History   Tobacco Use  Smoking Status Former  Smokeless Tobacco Never  Tobacco Comments   QUIT 45+ YEARS AGO    Goals Met:  Independence with exercise equipment Exercise tolerated well Personal goals reviewed No report of concerns or symptoms today Strength training completed today  Goals Unmet:  Not Applicable  Comments: Pt was cleared for supervised exercise therapy by Dr. Antionette Kirks  Due to patients hx of CV disease pt is being watched closely and monitored for any unusual signs/symptoms while  exercising. Will continue to watch closely, monitor, and report any unusual or unexpected pain through the program.  Pt exercised on the Treadmill  for 34 minutes and experienced 3/5 claudication pain that resolved with sitting rest breaks. Pt tolerated exercise w/o unusual and unexpected symptoms. Will continue to monitor and progress through the program following PAD protocol.     Longest exercised time before break: 18 Total exercise time/rest time: 24/5  Service time is from 1030 to 1145.    Dr. Gaylyn Keas is Medical Director for Cardiac Rehab at [facility].

## 2024-01-19 MED ORDER — VERAPAMIL HCL ER 120 MG PO TBCR: 120.0000 mg | EXTENDED_RELEASE_TABLET | Freq: Two times a day (BID) | ORAL | 2 refills | Status: DC

## 2024-01-22 ENCOUNTER — Encounter (HOSPITAL_COMMUNITY)
Admission: RE | Admit: 2024-01-22 | Discharge: 2024-01-22 | Disposition: A | Discharge status: Home / Self Care | Source: Ambulatory Visit | Attending: Cardiovascular Disease | Admitting: Cardiovascular Disease

## 2024-01-22 DIAGNOSIS — I70211 Atherosclerosis of native arteries of extremities with intermittent claudication, right leg: Secondary | ICD-10-CM | POA: Diagnosis not present

## 2024-01-22 NOTE — Progress Notes (Signed)
 Daily Session Note  Patient Details  Name: [name] MRN: [id number] Date of Birth: 1941/08/01 Referring Provider:   [name] Supervised Exercise Therapy from 12/03/2023 in [facility] for Heart, Vascular, & Lung Health  Referring Provider Dr. Antionette Kirks       Encounter Date: 01/22/2024  Check In:  Session Check In - 01/22/24 1205       Check-In   Supervising physician immediately available to respond to emergencies [facility] immediately available    Physician(s) Morey Ar, NP    Location [facility]    Staff Present Joann Mu, RN, Malvin Searing, MS, ACSM-CEP, CCRP, Exercise Physiologist;Dimitra Woodstock Alexia Angelucci, MS, Exercise Physiologist;Olinty Gaylene Kays, MS, ACSM-CEP, Exercise Physiologist;Kaylee Nolon Baxter, MS, ACSM-CEP, Exercise Physiologist;Bailey Martina Sledge, MS, Exercise Physiologist;Casey Felipe Horton, RT    Virtual Visit No    Medication changes reported     No    Fall or balance concerns reported    No    Tobacco Cessation No Change    Warm-up and Cool-down Performed on first and last piece of equipment   Warm Up on TM; Weights and Cool-down as group   Resistance Training Performed Yes    VAD Patient? No    PAD/SET Patient? Yes      PAD/SET Patient   Completed foot check today? Yes    Open wounds to report? No      Pain Assessment   Currently in Pain? No/denies    Multiple Pain Sites No             Capillary Blood Glucose: No results found for this or any previous visit (from the past 24 hours).    Social History   Tobacco Use  Smoking Status Former  Smokeless Tobacco Never  Tobacco Comments   QUIT 45+ YEARS AGO    Goals Met:  Independence with exercise equipment Exercise tolerated well No report of concerns or symptoms today Strength training completed today  Goals Unmet:  Not Applicable  Comments: Pt was cleared for supervised exercise therapy by Dr. Antionette Kirks  Due to patients hx of CV disease  pt is being watched closely and monitored for any unusual signs/symptoms while exercising. Will continue to watch closely, monitor, and report any unusual or unexpected pain through the program.  Pt exercised on the Treadmill  for 37 minutes and experienced 2/5 claudication pain that resolved with sitting rest breaks. Pt tolerated exercise w/o unusual and unexpected symptoms. Will continue to monitor and progress through the program following PAD protocol.     Longest exercised time before break: 25 Total exercise time/rest time: 37/3  Service time is from 1030 to 1145.    Dr. Gaylyn Keas is Medical Director for Cardiac Rehab at [facility].

## 2024-01-25 ENCOUNTER — Encounter (HOSPITAL_COMMUNITY)
Admission: RE | Admit: 2024-01-25 | Discharge: 2024-01-25 | Disposition: A | Discharge status: Home / Self Care | Source: Ambulatory Visit | Attending: Cardiovascular Disease | Admitting: Cardiovascular Disease

## 2024-01-25 DIAGNOSIS — I70211 Atherosclerosis of native arteries of extremities with intermittent claudication, right leg: Secondary | ICD-10-CM

## 2024-01-25 NOTE — Progress Notes (Signed)
 Daily Session Note  Patient Details  Name: [name] MRN: [id number] Date of Birth: Feb 05, 1941 Referring Provider:   [name] Supervised Exercise Therapy from 12/03/2023 in [facility] for Heart, Vascular, & Lung Health  Referring Provider Dr. Antionette Kirks       Encounter Date: 01/25/2024  Check In:  Session Check In - 01/25/24 1043       Check-In   Supervising physician immediately available to respond to emergencies [facility] immediately available    Physician(s) Morey Ar, NP    Location [facility]    Staff Present Joann Mu, RN, Malvin Searing, MS, ACSM-CEP, CCRP, Exercise Physiologist;Caedan Sumler Alexia Angelucci, MS, Exercise Physiologist;Olinty Gaylene Kays, MS, ACSM-CEP, Exercise Physiologist;Casey Felipe Horton, RT;Jetta Walker BS, ACSM-CEP, Exercise Physiologist    Virtual Visit No    Medication changes reported     No    Fall or balance concerns reported    No    Tobacco Cessation No Change    Warm-up and Cool-down Performed on first and last piece of equipment   Warm Up on TM; Weights and Cool-down as group   Resistance Training Performed Yes    VAD Patient? No    PAD/SET Patient? Yes      PAD/SET Patient   Completed foot check today? Yes    Open wounds to report? No      Pain Assessment   Currently in Pain? No/denies    Multiple Pain Sites No             Capillary Blood Glucose: No results found for this or any previous visit (from the past 24 hours).    Social History   Tobacco Use  Smoking Status Former  Smokeless Tobacco Never  Tobacco Comments   QUIT 45+ YEARS AGO    Goals Met:  Independence with exercise equipment Exercise tolerated well No report of concerns or symptoms today Strength training completed today  Goals Unmet:  Not Applicable  Comments: Pt was cleared for supervised exercise therapy by Dr. Antionette Kirks  Due to patients hx of CV disease pt is being watched closely and  monitored for any unusual signs/symptoms while exercising. Will continue to watch closely, monitor, and report any unusual or unexpected pain through the program.  Pt exercised on the Treadmill  for 38 minutes and experienced 3/5 claudication pain that resolved with sitting rest breaks. Pt tolerated exercise w/o unusual and unexpected symptoms. Will continue to monitor and progress through the program following PAD protocol.    Longest exercised time before break: 28 Total exercise time/rest time: 38/5  Service time is from 1030 to 1145.   Dr. Gaylyn Keas is Medical Director for Cardiac Rehab at [facility].

## 2024-01-25 NOTE — Progress Notes (Signed)
 PAD/SET Plan of Care Updates  Patient Details  Name: [name] MRN: [id number] Date of Birth: 06-19-1941  Entry Date:  Flowsheet Row Supervised Exercise Therapy from 12/03/2023 in [facility] for Heart, Vascular, & Lung Health  Date 12/03/23      Expected Discharge Date:  Flowsheet Row Supervised Exercise Therapy from 12/03/2023 in [facility] for Heart, Vascular, & Lung Health  Expected Discharge Date 02/19/24       Medical Diagnosis: 11/12/23 Atherosclerosis of native artery of right lower extremity with intermittent claudication (HCC)  Encounter Date: 01/25/24  Exercise Target Goals: Exercise Program Goal: Individual exercise prescription set with THRR, safety & activity barriers. Participant demonstrates ability to understand and report RPE using RPE 6-20 scale, to self-measure pulse accurately, and to acknowledge the importance of the exercise prescription.  Exercise Prescription Goal: Use initial exercise assessment to set exercise prescription to improve time and distance to onset of claudication pain. Provide education to aid in steps toward risk factor modification, improve quality of life with claudication, and utilize THRR and exercise prescription for best results for decreasing symptoms of PAD. Prevent injury to bone, joints, and skin integrity during the exercise through checks of each system during session check in. Following physician guidelines for any contraindications to specific exercises.  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 Antionette Kirks    Expected Discharge Date 02/19/24      Treadmill   MPH 1.2    Grade 4    Minutes 20    METs 2.6      Prescription Details   Frequency (times per week) 2 days in program, 1 day at home    Duration Progress to 10 minutes continuous walking  at current work load and total walking time to 30-45 min      Intensity   THRR 40-80% of Max Heartrate 55-110    Ratings of Perceived Exertion 11-13    Perceived Dyspnea 0-4      Progression   Progression Continue to follow PAD protocol      Resistance Training   Training Prescription Yes    Weight 2    Reps 10-15             Perform Capillary Blood Glucose checks as needed.  Current Exercise Prescription:   Exercise Prescription Changes     Row Name 12/04/23 1100 12/07/23 1100 12/21/23 1100 12/25/23 1300 12/28/23 1200     Response to Exercise   Blood Pressure (Admit) 130/46 118/56 112/48 112/56 118/58   Blood Pressure (Exercise) 122/46 122/50 132/50 130/60 114/60   Blood Pressure (Exit) 96/48 110/54 108/52 100/56 110/56   Heart Rate (Admit) 70 bpm 71 bpm 77 bpm 74 bpm 71 bpm   Heart Rate (Exercise) 88 bpm 89 bpm 97 bpm 94 bpm 91 bpm   Heart Rate (Exit) 70 bpm 71 bpm 70 bpm 71 bpm 73 bpm   Oxygen Saturation (Admit)  96 % 96 % 96 % 96 % 95 %   Oxygen Saturation (Exercise) 96 % 96 % 96 % 96 % 96 %   Oxygen Saturation (Exit) 91 % 94 % 93 % 95 % 95 %   Rating of Perceived Exertion (Exercise) 11 11.5 11.5 11.5 13   Perceived Dyspnea (Exercise) 0 0 0 0 0   Symptoms 2/5 claudication 2/5 claduication 2/5 claudication, fatigue, 1-2 BL knee pain 1.5/5 claudication, fatigue, 1.5 BL knee pain 3/5 claudication   Comments Pt first day Pt increase incline by 1% Incline increased to 6.5 Incline increased to 7 Incline increased to 7.5   Duration Progress to 30 minutes of  aerobic without signs/symptoms of physical  distress Progress to 10 minutes continuous walking  at current work load and total walking time to 30-45 min Progress to 10 minutes continuous walking  at current work load and total walking time to 30-45 min Progress to 10 minutes continuous walking  at current work load and total walking time to 30-45 min Progress to 30 minutes of  aerobic without signs/symptoms of physical distress   Intensity THRR unchanged THRR unchanged THRR unchanged THRR unchanged THRR unchanged     Progression   Progression Continue to follow PAD protocol Continue to follow PAD protocol Continue to follow PAD protocol Continue to follow PAD protocol Continue to follow PAD protocol   Average METs 2.6 2.7 3 3.2 --     Resistance Training   Training Prescription Yes Yes Yes Yes Yes   Weight 2 3 3 3 3    Reps 10-15 10-15 10-15 10-15 10-15   Time -- -- -- 10 Minutes 10 Minutes     Treadmill   MPH 1.2 1.2 1.2 1.4 1.4   Grade 4 5 6.5 7 7.5   Minutes 45 30 35 30 28   METs 2.6 2.7 3 3.2 3.5    Row Name 01/01/24 1100 01/04/24 1100 01/15/24 1200         Response to Exercise   Blood Pressure (Admit) 116/52 120/50 106/50     Blood Pressure (Exercise) 122/60 122/56 --     Blood Pressure (Exit) 110/58 106/60 112/72     Heart Rate (Admit) 95 bpm 77 bpm 72 bpm     Heart Rate (Exercise) 91 bpm 94 bpm 107 bpm     Heart Rate (Exit) 80 bpm 70 bpm 70 bpm     Oxygen Saturation (Admit) 97 % -- --     Oxygen Saturation (Exercise) 94 % -- --     Oxygen Saturation (Exit) 93 % -- --     Rating of Perceived Exertion (Exercise) 12 15 15      Perceived Dyspnea (Exercise) 0 0 0     Symptoms 1.5/5 claudication 3/5 claudication 3/5 claudication     Comments Incline increased to 9 Incline increased to 10.5 incline increased to 11     Duration Progress to 30 minutes of  aerobic without signs/symptoms of physical distress Progress to 10 minutes continuous walking  at current work load and total walking time to 30-45 min Progress to 10 minutes  continuous walking  at current work load and total walking time to 30-45 min     Intensity THRR unchanged THRR unchanged THRR unchanged       Progression   Progression Continue to follow PAD protocol Continue to follow PAD protocol Continue to follow PAD protocol     Average METs -- 4.3 4.7  Resistance Training   Training Prescription Yes Yes Yes     Weight 3 3 3      Reps 10-15 10-15 10-15     Time 10 Minutes 10 Minutes 10 Minutes       Treadmill   MPH 1.5 1.5 1.6     Grade 9 10.5 11     Minutes 30 31 35     METs 3.8 4.3 4.7              Exercise Comments:   Exercise Comments     Row Name 12/03/23 1212 12/04/23 1154 12/07/23 1203 12/21/23 1211 01/04/24 1158   Exercise Comments Pt orientation, pt completed TM test. All VSS after test. Pt first day in program Pt increased incline to 5%, pt tolerated change well without unsual symptoms. Will continue to monitor progress and adjust WL as tolerated. Pt exercised on the Treadmill  for 35 minutes and experienced 2/5 claudication pain that resolved with sitting rest breaks. Pt tolerated exercise w/o unusual and unexpected symptoms. Will continue to monitor and progress through the program following PAD protocol.      Pt tolerated session w/o unusual symptoms. Will increase incline by 0.5% next session.     Initial onset of claudication at: 13 minutes   Longest exercised time before break: 20 minutes   Total exercise time/rest time: 35/2 Pt exercised on the Treadmill  for 31 minutes and experienced 3/5 claudication pain that resolved with sitting rest breaks. Pt tolerated exercise w/o unusual and unexpected symptoms. Will continue to monitor and progress through the program following PAD protocol.      Initial onset of claudication at: 4:00  Longest exercised time before break: 9:58  Total exercise time/rest time: 31/4    Row Name 01/15/24 1205           Exercise Comments Pt increased to 11% on incline, pt reported that the exercise  felt hard but her claudication pain remained a 3/5. Will continue to monitor.                Program and Patient Goals Exercise Goals and Review:   Exercise Goals     Row Name 12/03/23 1211             Exercise Goals   Increase Physical Activity Yes       Intervention Provide advice, education, support and counseling about physical activity/exercise needs.;Develop an individualized exercise prescription for aerobic and resistive training based on initial evaluation findings, risk stratification, comorbidities and participant's personal goals.       Expected Outcomes Short Term: Attend rehab on a regular basis to increase amount of physical activity.;Long Term: Exercising regularly at least 3-5 days a week.;Long Term: Add in home exercise to make exercise part of routine and to increase amount of physical activity.       Increase Strength and Stamina Yes       Intervention Provide advice, education, support and counseling about physical activity/exercise needs.;Develop an individualized exercise prescription for aerobic and resistive training based on initial evaluation findings, risk stratification, comorbidities and participant's personal goals.       Expected Outcomes Short Term: Increase workloads from initial exercise prescription for resistance, speed, and METs.;Short Term: Perform resistance training exercises routinely during rehab and add in resistance training at home;Long Term: Improve cardiorespiratory fitness, muscular endurance and strength as measured by increased METs and functional capacity ( )       Able to understand and use rate of perceived  exertion (RPE) scale Yes       Intervention Provide education and explanation on how to use RPE scale       Expected Outcomes Short Term: Able to use RPE daily in rehab to express subjective intensity level;Long Term:  Able to use RPE to guide intensity level when exercising independently       Knowledge and understanding of  Target Heart Rate Range (THRR) Yes       Intervention Provide education and explanation of THRR including how the numbers were predicted and where they are located for reference       Expected Outcomes Short Term: Able to state/look up THRR;Long Term: Able to use THRR to govern intensity when exercising independently;Short Term: Able to use daily as guideline for intensity in rehab       Understanding of Exercise Prescription Yes       Intervention Provide education, explanation, and written materials on patient's individual exercise prescription       Expected Outcomes Short Term: Able to explain program exercise prescription;Long Term: Able to explain home exercise prescription to exercise independently       Improve claudication pain toleration; Improve walking ability Yes       Intervention Participate in PAD/SET Rehab 2-3 days a week, walking at home as part of exercise prescription;Attend education sessions to aid in risk factor modification and understanding of disease process       Expected Outcomes Long Term: Improve score of PAD questionnaires;Long Term: Improve walking ability and toleration to claudication;Short Term: Improve walking distance/time to onset of claudication pain                Nutrition:  Target Goals: Understanding of nutrition guidelines, daily intake of sodium 1500mg , cholesterol 200mg , calories 30% from fat and 7% or less from saturated fats, daily to have 5 or more servings of fruits and vegetables.  Biometrics:  Pre Biometrics - 12/03/23 1202       Pre Biometrics   Waist Circumference 38 inches    Hip Circumference 40 inches    Waist to Hip Ratio 0.95 %    Triceps Skinfold 7 mm    % Body Fat 33.3 %    Grip Strength 14 kg    Flexibility 12.5 in    Single Leg Stand 19.8 seconds               ITP Comments:  ITP Comments     Row Name 12/03/23 1050           ITP Comments Dr. Alvenia Aus referring/supervising SET provider                 Comments: Pt is currently walking at 1.6/11.0 on the treadmill. She has noticed some improvement in her claudication when waking around the neighborhood and in the park. I've increased her weights to 4lbs. I also plan to increase her incline soon as she is reporting very minimal discomfort when walking. She should continue SET to further improve her claudication symptoms.   Physician Documentation: Your signature/cosign is required to indicate approval of the Care Plan as stated above. By signing this report, you are approving the Plan of Care. Please sign and either send electronically or print and fax the signed copy to the number below. Please indicate any changes or limitations in the space provided.   ____________________________________________________________________________________________________________________________________________  Physician Signature: _____________________________________________________ Print Physician Name: ____________________________________________________ Date: ___________________________ Time: _________________________________  [facility]  [place]  Evelena Hines. [facility] [address] [place], [place] [address] Phone: [phone] Fax: [phone]

## 2024-01-29 ENCOUNTER — Encounter (HOSPITAL_COMMUNITY)

## 2024-01-29 ENCOUNTER — Encounter (HOSPITAL_COMMUNITY)
Admission: RE | Admit: 2024-01-29 | Discharge: 2024-01-29 | Disposition: A | Discharge status: Home / Self Care | Source: Ambulatory Visit | Attending: Cardiovascular Disease | Admitting: Cardiovascular Disease

## 2024-01-29 DIAGNOSIS — I70211 Atherosclerosis of native arteries of extremities with intermittent claudication, right leg: Secondary | ICD-10-CM | POA: Diagnosis not present

## 2024-01-29 NOTE — Progress Notes (Signed)
 Daily Session Note  Patient Details  Name: [name] MRN: [id number] Date of Birth: Sep 30, 1940 Referring Provider:   [name] Supervised Exercise Therapy from 12/03/2023 in [facility]  Referring Provider Dr. Antionette Kirks       Encounter Date: 01/29/2024  Check In:  Session Check In - 01/29/24 1154       Check-In   Supervising physician immediately available to respond to emergencies [facility] MD immediately available    Physician(s) Sheryle Donning NP    Location [facility]    Staff Present Joann Mu, RN, BSN;Myron Lona Alexia Angelucci, MS, Exercise Physiologist;Olinty Gaylene Kays, MS, ACSM-CEP, Exercise Physiologist;Casey Felipe Horton, RT;Jetta Walker BS, ACSM-CEP, Exercise Physiologist    Virtual Visit No    Medication changes reported     No    Fall or balance concerns reported    No    Tobacco Cessation No Change    Warm-up and Cool-down Performed on first and last piece of equipment   Warm Up on TM; Weights and Cool-down as group   Resistance Training Performed Yes    VAD Patient? No    PAD/SET Patient? Yes      PAD/SET Patient   Completed foot check today? Yes    Open wounds to report? No      Pain Assessment   Currently in Pain? No/denies    Multiple Pain Sites No             Capillary Blood Glucose: No results found for this or any previous visit (from the past 24 hours).   Exercise Prescription Changes - 01/29/24 1100       Response to Exercise   Blood Pressure (Admit) 126/52    Blood Pressure (Exit) 118/62    Heart Rate (Admit) 76 bpm    Heart Rate (Exercise) 100 bpm    Heart Rate (Exit) 70 bpm    Rating of Perceived Exertion (Exercise) 14    Perceived Dyspnea (Exercise) 0    Symptoms 2/5    Comments Speed increased to 1.7, Incline increased to 13    Duration Continue with 30 min of aerobic exercise without signs/symptoms of physical distress.    Intensity THRR unchanged       Progression   Progression Continue to follow PAD protocol    Average METs 5.3      Resistance Training   Training Prescription Yes    Weight 4    Reps 10-15    Time 10 Minutes      Treadmill   MPH 1.7    Grade 13    Minutes 32    METs 5.3             Social History   Tobacco Use  Smoking Status Former  Smokeless Tobacco Never  Tobacco Comments   QUIT 45+ YEARS AGO    Goals Met:  Independence with exercise equipment Exercise tolerated well No report of concerns or symptoms today Strength training completed today  Goals Unmet:  Not Applicable  Comments: Pt was cleared for supervised exercise therapy by Dr. Antionette Kirks  Due to patients hx of CV disease pt is being watched closely and monitored for any unusual signs/symptoms while exercising. Will continue to watch closely, monitor, and report any unusual or unexpected pain through the program.  Pt exercised on the Treadmill  for 32 minutes and experienced 2/5 claudication pain that resolved with sitting rest breaks. Pt tolerated exercise w/o unusual  and unexpected symptoms. Will continue to monitor and progress through the program following PAD protocol.    Initial onset of claudication at: 7 Longest exercised time before break: 24 Total exercise time/rest time: 32/3  Service time is from 1030 to 1145.    Dr. Gaylyn Keas is Medical Director for Cardiac Rehab at [facility].

## 2024-02-01 ENCOUNTER — Encounter (HOSPITAL_COMMUNITY)

## 2024-02-05 ENCOUNTER — Encounter (HOSPITAL_COMMUNITY)
Admission: RE | Admit: 2024-02-05 | Discharge: 2024-02-05 | Disposition: A | Discharge status: Home / Self Care | Source: Ambulatory Visit | Attending: Cardiovascular Disease | Admitting: Cardiovascular Disease

## 2024-02-05 DIAGNOSIS — I70211 Atherosclerosis of native arteries of extremities with intermittent claudication, right leg: Secondary | ICD-10-CM

## 2024-02-05 NOTE — Progress Notes (Signed)
 Daily Session Note  Patient Details  Name: [name] MRN: [id number] Date of Birth: 11-06-1940 Referring Provider:   [name] Supervised Exercise Therapy from 12/03/2023 in [facility]  Referring Provider Dr. Antionette Kirks       Encounter Date: 02/05/2024  Check In:  Session Check In - 02/05/24 1357       Check-In   Supervising physician immediately available to respond to emergencies [facility] immediately available    Physician(s) Charles Connor, NP    [facility]    Staff Present Joann Mu, RN, BSN;Laurielle Selmon Alexia Angelucci, MS, Exercise Physiologist;Olinty Gaylene Kays, MS, ACSM-CEP, Exercise Physiologist;Jetta Otho Blitz BS, ACSM-CEP, Exercise Physiologist;Bailey Martina Sledge, MS, Exercise Physiologist    Virtual Visit No    Medication changes reported     No    Fall or balance concerns reported    No    Tobacco Cessation No Change    Warm-up and Cool-down Performed on first and last piece of equipment   Warm Up on TM; Weights and Cool-down as group   Resistance Training Performed Yes    VAD Patient? No    PAD/SET Patient? Yes      PAD/SET Patient   Completed foot check today? Yes    Open wounds to report? No      Pain Assessment   Currently in Pain? No/denies    Multiple Pain Sites No             Capillary Blood Glucose: No results found for this or any previous visit (from the past 24 hours).   Exercise Prescription Changes - 02/05/24 1300       Response to Exercise   Blood Pressure (Admit) 124/64    Blood Pressure (Exit) 126/56    Heart Rate (Admit) 70 bpm    Heart Rate (Exercise) 106 bpm    Heart Rate (Exit) 70 bpm    Rating of Perceived Exertion (Exercise) 13    Symptoms 1/5 claudication    Comments speed increase to 1.8    Duration Continue with 30 min of aerobic exercise without signs/symptoms of physical distress.    Intensity THRR unchanged      Progression   Progression  Continue to follow PAD protocol    Average METs 5.6      Resistance Training   Training Prescription Yes    Weight 4    Reps 10-15    Time 10 Minutes      Treadmill   MPH 1.8    Grade 13    Minutes 41    METs 5.6             Social History   Tobacco Use  Smoking Status Former  Smokeless Tobacco Never  Tobacco Comments   QUIT 45+ YEARS AGO    Goals Met:  Independence with exercise equipment Exercise tolerated well No report of concerns or symptoms today Strength training completed today  Goals Unmet:  Not Applicable  Comments: Pt was cleared for supervised exercise therapy by Dr. Antionette Kirks  Due to patients hx of CV disease pt is being watched closely and monitored for any unusual signs/symptoms while exercising. Will continue to watch closely, monitor, and report any unusual or unexpected pain through the program.  Pt exercised on the Treadmill  for 41 minutes and experienced 1/5 claudication pain that resolved with sitting rest breaks. Pt tolerated exercise w/o unusual and unexpected symptoms. Will continue to monitor and  progress through the program following PAD protocol.    Longest exercised time before break: 26 minutes  Total exercise time/rest time: 41/6  Service time is from 1030 to 1145.    Dr. Gaylyn Keas is Medical Director for Cardiac Rehab at [facility].

## 2024-02-09 NOTE — Progress Notes (Signed)
 Remote pacemaker transmission.

## 2024-02-09 NOTE — Addendum Note (Signed)
 Addended by: Lott Rouleau A on: 02/09/2024 08:24 AM   Modules accepted: Orders

## 2024-02-12 ENCOUNTER — Encounter (HOSPITAL_COMMUNITY)
Admission: RE | Admit: 2024-02-12 | Discharge: 2024-02-12 | Disposition: A | Discharge status: Home / Self Care | Source: Ambulatory Visit | Attending: Cardiovascular Disease | Admitting: Cardiovascular Disease

## 2024-02-12 DIAGNOSIS — I70211 Atherosclerosis of native arteries of extremities with intermittent claudication, right leg: Secondary | ICD-10-CM

## 2024-02-12 NOTE — Progress Notes (Signed)
 Daily Session Note  Patient Details  Name: [name] MRN: [id number] Date of Birth: Dec 11, 1940 Referring Provider:   [name] Supervised Exercise Therapy from 12/03/2023 in [facility]  Referring Provider Dr. Antionette Kirks       Encounter Date: 02/12/2024  Check In:  Session Check In - 02/12/24 1105       Check-In   Supervising physician immediately available to respond to emergencies [facility] immediately available    Physician(s) Charles Connor, NP    [facility]    Staff Present Joann Mu, RN, BSN;Wendle Kina Alexia Angelucci, MS, Exercise Physiologist;Olinty Gaylene Kays, MS, ACSM-CEP, Exercise Physiologist;Jetta Walker BS, ACSM-CEP, Exercise Physiologist    Virtual Visit No    Medication changes reported     No    Fall or balance concerns reported    No    Tobacco Cessation No Change    Warm-up and Cool-down Performed on first and last piece of equipment   Warm Up on TM; Weights and Cool-down as group   Resistance Training Performed Yes    VAD Patient? No    PAD/SET Patient? Yes      PAD/SET Patient   Completed foot check today? Yes    Open wounds to report? No      Pain Assessment   Currently in Pain? No/denies    Multiple Pain Sites No             Capillary Blood Glucose: No results found for this or any previous visit (from the past 24 hours).    Social History   Tobacco Use  Smoking Status Former  Smokeless Tobacco Never  Tobacco Comments   QUIT 45+ YEARS AGO    Goals Met:  Independence with exercise equipment Exercise tolerated well No report of concerns or symptoms today Strength training completed today  Goals Unmet:  Not Applicable  Comments:  Pt was cleared for supervised exercise therapy by Dr. Antionette Kirks  Due to patients hx of CV disease pt is being watched closely and monitored for any unusual signs/symptoms while exercising. Will continue to watch  closely, monitor, and report any unusual or unexpected pain through the program.  Pt exercised on the Treadmill  for 45 minutes and experienced 1.5/5 claudication pain that resolved with sitting rest breaks. Pt tolerated exercise w/o unusual and unexpected symptoms. Will continue to monitor and progress through the program following PAD protocol.    Initial onset of claudication at: 10 Longest exercised time before break: 25 Total exercise time/rest time: 45/3  Service time is from 1030 to 1145.    Dr. Gaylyn Keas is Medical Director for Cardiac Rehab at [facility].

## 2024-02-15 ENCOUNTER — Encounter (HOSPITAL_COMMUNITY)
Admission: RE | Admit: 2024-02-15 | Discharge: 2024-02-15 | Disposition: A | Discharge status: Home / Self Care | Source: Ambulatory Visit | Attending: Cardiovascular Disease | Admitting: Cardiovascular Disease

## 2024-02-15 DIAGNOSIS — I70211 Atherosclerosis of native arteries of extremities with intermittent claudication, right leg: Secondary | ICD-10-CM | POA: Insufficient documentation

## 2024-02-15 NOTE — Progress Notes (Signed)
 Daily Session Note  Patient Details  Name: MIRAL HOOPES MRN: [id number] Date of Birth: 1941-03-16 Referring Provider:   [name] Supervised Exercise Therapy from 12/03/2023 in [facility]  Referring Provider Dr. Antionette Kirks       Encounter Date: 02/15/2024  Check In:  Session Check In - 02/15/24 1101       Check-In   Supervising physician immediately available to respond to emergencies [facility] MD immediately available    Physician(s) Katlyn, West, NP    Location [facility]    Staff Present Joann Mu, RN, Lysbeth Sauger, MS, ACSM-CEP, Exercise Physiologist;Jetta Otho Blitz BS, ACSM-CEP, Exercise Physiologist;David Vernel Golds, MS, ACSM-CEP, CCRP, Exercise Physiologist;Alylah Blakney Martina Sledge, MS, Exercise Physiologist    Virtual Visit No    Medication changes reported     No    Fall or balance concerns reported    No    Tobacco Cessation No Change    Warm-up and Cool-down Performed on first and last piece of equipment   Warm Up on TM; Weights and Cool-down as group   Resistance Training Performed Yes    VAD Patient? No    PAD/SET Patient? Yes      PAD/SET Patient   Completed foot check today? Yes    Open wounds to report? No      Pain Assessment   Currently in Pain? No/denies    Multiple Pain Sites No             Capillary Blood Glucose: No results found for this or any previous visit (from the past 24 hours).    Social History   Tobacco Use  Smoking Status Former  Smokeless Tobacco Never  Tobacco Comments   QUIT 45+ YEARS AGO    Goals Met:  Independence with exercise equipment Exercise tolerated well No report of concerns or symptoms today Strength training completed today  Goals Unmet:  Not Applicable  Comments: Pt was cleared for supervised exercise therapy by Dr. Antionette Kirks  Due to patients hx of CV disease pt is being watched closely and monitored for any unusual  signs/symptoms while exercising. Will continue to watch closely, monitor, and report any unusual or unexpected pain through the program.  Pt exercised on the Treadmill  for 35 minutes and experienced 0/5 claudication pain. Pt tolerated exercise w/o unusual and unexpected symptoms. Pt increased workload on treadmill today to 1.7 mph/13.5 inc for first 5 min of exercise, then increased to 1.8 mph/13.5 inc for 15 min, then increased to 1.8 mph/ 14 inc for 3 min followed by a seated rest break. She then resumed exercise for another 7 min at 1.8 mph/14 inc.  Will continue to monitor and progress through the program following PAD protocol.    Initial onset of claudication at: no claudication, pt took seated rest break at minute due to fatigue. Resumed for 7 minutes after.  Longest exercised time before break: 28 min. Total exercise time/rest time: 35 min  Service time is from 1038 to 1150.    Dr. Gaylyn Keas is Medical Director for Cardiac Rehab at [facility].

## 2024-02-19 ENCOUNTER — Encounter (HOSPITAL_COMMUNITY)
Admission: RE | Admit: 2024-02-19 | Discharge: 2024-02-19 | Disposition: A | Discharge status: Home / Self Care | Source: Ambulatory Visit | Attending: Cardiovascular Disease | Admitting: Cardiovascular Disease

## 2024-02-19 VITALS — Ht 59.0 in | Wt 119.3 lb

## 2024-02-19 DIAGNOSIS — I70211 Atherosclerosis of native arteries of extremities with intermittent claudication, right leg: Secondary | ICD-10-CM

## 2024-02-19 NOTE — Progress Notes (Signed)
 Discharge Progress Report  Patient Details  Name: [name] MRN: [id number] Date of Birth: 03-28-1941 Referring Provider:   [name] Supervised Exercise Therapy from 12/03/2023 in [facility]  Referring Provider Dr. Antionette Kirks        Number of Visits: 19  Reason for Discharge: Pt attended 12-weeks of SET  Smoking History:  Social History   Tobacco Use  Smoking Status Former  Smokeless Tobacco Never  Tobacco Comments   QUIT 45+ YEARS AGO    Diagnosis:  11/12/23 Atherosclerosis of native artery of right lower extremity with intermittent claudication (HCC)  ADL [facility]:   Initial Exercise Prescription:  Initial Exercise Prescription - 12/03/23 1200       Date of Initial Exercise RX and Referring Provider   Date 12/03/23    Referring Provider Dr. Antionette Kirks    Expected Discharge Date 02/19/24      Treadmill   MPH 1.2    Grade 4    Minutes 20    METs 2.6      Prescription Details   Frequency (times per week) 2 days in program, 1 day at home    Duration Progress to 10 minutes continuous walking  at current work load and total walking time to 30-45 min      Intensity   THRR 40-80% of Max Heartrate 55-110    Ratings of Perceived Exertion 11-13    Perceived Dyspnea 0-4      Progression   Progression Continue to follow PAD protocol      Resistance Training   Training Prescription Yes    Weight 2    Reps 10-15             Discharge Exercise Prescription (Final Exercise Prescription Changes):  Exercise Prescription Changes - 02/15/24 1400       Response to Exercise   Blood Pressure (Admit) 110/40    Blood Pressure (Exit) 108/58    Heart Rate (Admit) 99 bpm    Heart Rate (Exercise) 107 bpm    Heart Rate (Exit) 70 bpm    Rating of Perceived Exertion (Exercise) 15    Perceived Dyspnea (Exercise) 0    Symptoms no claudication    Comments incline increase to 14    Duration  Continue with 30 min of aerobic exercise without signs/symptoms of physical distress.    Intensity THRR unchanged      Progression   Progression Continue to follow PAD protocol    Average METs 5.7      Resistance Training   Training Prescription Yes    Weight 4    Reps 10-15    Time 10 Minutes      Treadmill   MPH 1.8    Grade 14    Minutes 35    METs 5.7             Functional Capacity: 5.7 METs   Psychological, QOL, Others - Outcomes: PHQ 2/9:     No data to display          Quality of Life:  Quality of Life - 02/19/24 1256       Quality of Life   Select PAD/SET Quality of Life      PAD/SET Quality of Life Scores   Social Relationships and Interactions Pre 45    Social Relationships and Interactions Post 48    Social Relationships and Interactions % Change 6.67 %    Self-Concepts and  Feelings Pre 33    Self-Concepts and Feelings Post 40    Self-Concepts and Feelings % Change 21.21 %    Symptoms and Limitations Pre 32    Symptoms and Limitations Post 30    Symptoms and Limitations % Change -6.25 %    Fear and Uncertainty Pre 20    Fear and Uncertainty Post 21    Fear and Uncertainty % Change 5 %    Positive Adaptation Pre 27    Positive Adaptation Post 35    Positive Adaptation % Change 29.63 %    Job Pre 5    Job Post 5    Job % Change 0 %    Sex Pre 5    Sex Post 5    Sex % Change 0 %    Intimate Relationships Pre 6    Intimate Relationships Post 5    Intimate Relationsips % Change -16.67 %             Personal Goals: Short term: Improve claudication and get stronger  Long Term: Manage claudication with exercise     Personal Goals Discharge: Continue exercising at home to manage claudication.    Exercise Goals and Review:  Exercise Goals     Row Name 12/03/23 1211             Exercise Goals   Increase Physical Activity Yes       Intervention Provide advice, education, support and counseling about physical  activity/exercise needs.;Develop an individualized exercise prescription for aerobic and resistive training based on initial evaluation findings, risk stratification, comorbidities and participant's personal goals.       Expected Outcomes Short Term: Attend rehab on a regular basis to increase amount of physical activity.;Long Term: Exercising regularly at least 3-5 days a week.;Long Term: Add in home exercise to make exercise part of routine and to increase amount of physical activity.       Increase Strength and Stamina Yes       Intervention Provide advice, education, support and counseling about physical activity/exercise needs.;Develop an individualized exercise prescription for aerobic and resistive training based on initial evaluation findings, risk stratification, comorbidities and participant's personal goals.       Expected Outcomes Short Term: Increase workloads from initial exercise prescription for resistance, speed, and METs.;Short Term: Perform resistance training exercises routinely during rehab and add in resistance training at home;Long Term: Improve cardiorespiratory fitness, muscular endurance and strength as measured by increased METs and functional capacity ( )       Able to understand and use rate of perceived exertion (RPE) scale Yes       Intervention Provide education and explanation on how to use RPE scale       Expected Outcomes Short Term: Able to use RPE daily in rehab to express subjective intensity level;Long Term:  Able to use RPE to guide intensity level when exercising independently       Knowledge and understanding of Target Heart Rate Range (THRR) Yes       Intervention Provide education and explanation of THRR including how the numbers were predicted and where they are located for reference       Expected Outcomes Short Term: Able to state/look up THRR;Long Term: Able to use THRR to govern intensity when exercising independently;Short Term: Able to use daily as  guideline for intensity in rehab       Understanding of Exercise Prescription Yes       Intervention Provide education, explanation,  and written materials on patient's individual exercise prescription       Expected Outcomes Short Term: Able to explain program exercise prescription;Long Term: Able to explain home exercise prescription to exercise independently       Improve claudication pain toleration; Improve walking ability Yes       Intervention Participate in PAD/SET Rehab 2-3 days a week, walking at home as part of exercise prescription;Attend education sessions to aid in risk factor modification and understanding of disease process       Expected Outcomes Long Term: Improve score of PAD questionnaires;Long Term: Improve walking ability and toleration to claudication;Short Term: Improve walking distance/time to onset of claudication pain                Exercise Goals Re-Evaluation:  Exercise Goals Re-Evaluation     Row Name 01/18/24 1209             Exercise Goal Re-Evaluation   Exercise Goals Review Increase Physical Activity;Increase Strength and Stamina;Able to understand and use rate of perceived exertion (RPE) scale;Knowledge and understanding of Target Heart Rate Range (THRR);Understanding of Exercise Prescription;Improve claudication pain tolerance and improve walking ability       Comments Reviewed goals and progress with pt. Pt initial goal was to improve claudication with exercise. Pt reports that she is noticing "some" improvement in her claudication in the program but this has not translated well to walking at home. Pt reports that when her IBS symtoms are controlled she is able to walk at home at least twice a week. I encouraged pt to increase her time spent walking at home to about 30 minutes at least 3 days/week. She is tolerating 11% incline on the treadmill well but admits she stopps early d/t fatigue and not claduciation pain. Discussed interval training to increase  time spent walking.       Expected Outcomes Improvement in claudication                Nutrition & Weight - Outcomes:  Pre Biometrics - 12/03/23 1202       Pre Biometrics   Waist Circumference 38 inches    Hip Circumference 40 inches    Waist to Hip Ratio 0.95 %    Triceps Skinfold 7 mm    % Body Fat 33.3 %    Grip Strength 14 kg    Flexibility 12.5 in    Single Leg Stand 19.8 seconds             Post Biometrics - 02/19/24 1207        Post  Biometrics   Height 4\' 11"  (1.499 m)    Weight 54.1 kg    Waist Circumference 36 inches    Hip Circumference 38.5 inches    Waist to Hip Ratio 0.94 %    BMI (Calculated) 24.08    Triceps Skinfold 7 mm    % Body Fat 32.2 %    Grip Strength 10 kg    Flexibility 14.5 in    Single Leg Stand 18.25 seconds             Pt made significant progress on her TM test. Initial walk test was stopped after pt walked for 6 minutes and 52 seconds due to 3/5 claudication pain. Today, pt walked for 23 minutes and 31 seconds and experienced no pain throughout the test. The test was stopped d/t fatigue. Estimated body fat percentage was reduced from 33.3% to 32.2%. Pt improved on her sit and  reach test by 2 inches. Final discharge exercise prescription was 1. and 14% on the TM. Pt plans to continue exercising at home by walking in the park.   Goals reviewed with patient; copy given to patient.  Barkley Li MS,  ACSM-CEP 02/19/2024 1:26 PM

## 2024-02-20 ENCOUNTER — Encounter: Payer: Self-pay | Admitting: Internal Medicine

## 2024-02-23 NOTE — Telephone Encounter (Signed)
 Patient was able to send the transmission.  Normal device function. 2 short AT less than 12 sec duration. Overall heart rates okay on HG's. Other than patient requesting a tweak on her rate response after she returns from July trip (she will call us  then to make appt).  Nothing further on our end.    We will reschedule her remote that is set to occur while on vacay.

## 2024-03-10 ENCOUNTER — Encounter: Payer: Self-pay | Admitting: Internal Medicine

## 2024-04-04 ENCOUNTER — Ambulatory Visit

## 2024-04-04 DIAGNOSIS — I495 Sick sinus syndrome: Secondary | ICD-10-CM

## 2024-04-05 LAB — CUP PACEART REMOTE DEVICE CHECK
Battery Remaining Longevity: 86 mo
Battery Remaining Percentage: 74 %
Battery Voltage: 3.01 V
Brady Statistic RA Percent Paced: 95 %
Date Time Interrogation Session: 20250721020013
Implantable Lead Connection Status: 753985
Implantable Lead Connection Status: 753985
Implantable Lead Implant Date: 20090617
Implantable Lead Implant Date: 20090617
Implantable Lead Location: 753859
Implantable Lead Location: 753860
Implantable Pulse Generator Implant Date: 20220406
Lead Channel Impedance Value: 410 Ohm
Lead Channel Pacing Threshold Amplitude: 0.75 V
Lead Channel Pacing Threshold Pulse Width: 0.4 ms
Lead Channel Sensing Intrinsic Amplitude: 5 mV
Lead Channel Setting Pacing Amplitude: 2 V
Pulse Gen Model: 2272
Pulse Gen Serial Number: 3911946

## 2024-04-07 ENCOUNTER — Ambulatory Visit: Payer: Self-pay | Admitting: Cardiology

## 2024-05-24 ENCOUNTER — Encounter: Payer: Self-pay | Admitting: Internal Medicine

## 2024-06-16 NOTE — Progress Notes (Deleted)
 Cardiology Clinic Note   Patient Name: [name] Date of Encounter: 06/16/2024  Primary Care Provider:  Chinita Hoy CROME, PA-C Primary Cardiologist:  None  Patient Profile    ***  Past Medical History    Past Medical History:  Diagnosis Date   Asymmetric septal hypertrophy    Atrial fibrillation [facility])    with permanent pacemaker   Blepharitis 10/2016   Dr.Groat   Current use of long term anticoagulation    Endometriosis    Glaucoma    H/O mitral valve repair    Headache    HSV (herpes simplex virus) infection    Gluteal & near mouth   IBS (irritable bowel syndrome) 12/2002   Irregular heart beat    Melanoma (HCC) 12/06/2013   melanoma insitu right thigh (tx exc.)   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 (02/23/08  Dr. Dusty)   MYOMECTOMY     median sternotomy   PACEMAKER INSERTION  02/2008   PPM GENERATOR CHANGEOUT N/A 12/19/2020   Procedure: PPM GENERATOR CHANGEOUT;  Surgeon: Fernande Elspeth BROCKS, MD;  Location: [facility] INVASIVE CV LAB;  Service: Cardiovascular;  Laterality: N/A;   TONSILLECTOMY AND ADENOIDECTOMY  1955   TUBAL LIGATION  1983    Allergies  Allergies  Allergen Reactions   Amiodarone Other (See Comments)    Severe rash and upset stomach   Doxycycline     Unknown - pt had other issues going on at the time, unsure if this medication had anything to do with how she felt    History of Present Illness    ***  Home Medications    Prior to Admission medications   Medication Sig Start Date End Date Taking? Authorizing Provider  acetaminophen  (TYLENOL ) 650 MG CR tablet  Take 1,300 mg by mouth 2 (two) times daily.    [provider]  azelastine (ASTELIN) 0.1 % nasal spray Place 1 spray into the nose 2 (two) times daily. 07/22/23   [provider]  cycloSPORINE  (RESTASIS ) 0.05 % ophthalmic emulsion Place 1 drop into both eyes 2 (two) times daily.    [provider]  diclofenac Sodium (VOLTAREN) 1 % GEL Apply 1 application topically 2 (two) times daily. Knees    [provider]  dicyclomine  (BENTYL ) 20 MG tablet Take 20 mg by mouth 2 (two) times daily.    [provider]  diphenhydrAMINE  (BENADRYL ) 50 MG capsule Take 50 mg by mouth at bedtime.     [provider]  dorzolamide-timolol (COSOPT) 22.3-6.8 MG/ML ophthalmic solution Place 1 drop into both eyes 2 (two) times daily. 05/13/20   [provider]  Eyelid Cleansers (AVENOVA) 0.01 % SOLN Place 1 application into both eyes 2 (two) times daily.    [provider]  fluticasone (FLONASE) 50 MCG/ACT nasal spray Place 1 spray into both nostrils at bedtime. Patient not taking: Reported on 12/03/2023    [provider]  latanoprost  (XALATAN ) 0.005 % ophthalmic solution Place 1 drop into both eyes at bedtime.    [provider]  levocetirizine (  XYZAL) 5 MG tablet Take 5 mg by mouth every evening. 08/20/23   [provider]  loperamide (IMODIUM A-D) 2 MG tablet Take 2 mg by mouth 4 (four) times daily as needed for diarrhea or loose stools. Patient not taking: Reported on 12/03/2023    [provider]  MAGNESIUM  CL-CALCIUM  CARBONATE PO Take 1 tablet by mouth 2 (two) times daily. 500 mg /1000 mg    [provider]  Omega-3 Fatty Acids (FISH OIL) 1000 MG CAPS Take 2 capsules by mouth once a week. Pro Omega D Patient not taking: Reported on 12/03/2023    [provider]  rosuvastatin  (CRESTOR ) 10 MG tablet TAKE 1 TABLET(10 MG) BY MOUTH DAILY 11/25/21   Darron Deatrice LABOR, MD  valACYclovir (VALTREX) 500 MG tablet  Take 500 mg by mouth every other day.     [provider]  verapamil  (CALAN -SR) 120 MG CR tablet Take 1 tablet (120 mg total) by mouth 2 (two) times daily. 01/19/24   Fernande Elspeth BROCKS, MD    Family History    Family History  Problem Relation Age of Onset   Breast cancer Mother 36   Heart disease Mother    Breast cancer Maternal Grandmother 19   Coronary artery disease Brother        n his 16s with triple bypass    Coronary artery disease Brother        with 2 vessel coronary disease at age 14   Heart attack Father    She indicated that her mother is deceased. She indicated that the status of her father is unknown. She indicated that one of her three brothers is deceased. She indicated that her maternal grandmother is deceased. She indicated that her maternal grandfather is deceased. She indicated that her paternal grandmother is deceased. She indicated that her paternal grandfather is deceased.  Social History    Social History   Socioeconomic History   Marital status: Married    Spouse name: Not on file   Number of children: 1   Years of education: 16   Highest education level: Master's degree (e.g., MA, MS, MEng, MEd, MSW, MBA)  Occupational History   Occupation: Retired  Tobacco Use   Smoking status: Former   Smokeless tobacco: Never   Tobacco comments:    QUIT 45+ YEARS AGO  Vaping Use   Vaping status: Never Used  Substance and Sexual Activity   Alcohol use: Yes    Comment: 1 shot of bourbon most days, occ wine   Drug use: Yes    Types: Marijuana    Comment: Occasional use   Sexual activity: Yes    Partners: Male    Birth control/protection: Post-menopausal  Other Topics Concern   Not on file  Social History Narrative   Family History:   Last updated: 12/07/2008   Is remarkable for brother in his 30s with triple bypass and another brother with 2 vessel coronary disease at age 24.  Her father died at a later age with a myocardial infarction.       Social  History:   Last updated: 12/07/2008   The patient is married and lives here in [place] with her husband.  They have 1 grown child.  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  Her follow is Dr. Rome Li.  She is a nonsmoker, although she did smoke briefly during her youth and quit smoking during her 77s.  She denies excessive alcohol consumption.  Right-haned.   Tea or coffee occasionally.      Social Drivers of Corporate investment banker Strain: Low Risk  (02/08/2024)   Received from [facility]   Overall Financial Resource Strain (CARDIA)    Difficulty of Paying Living Expenses: Not hard at all  Food Insecurity: No Food Insecurity (02/08/2024)   Received from [facility]   Hunger Vital Sign    Within the past 12 months, you worried that your food would run out before you got the money to buy more.: Never true    Within the past 12 months, the food you bought just didn't last and you didn't have money to get more.: Never true  Transportation Needs: No Transportation Needs (02/08/2024)   Received from [facility] - Transportation    Lack of Transportation (Medical): No    Lack of Transportation (Non-Medical): No  Physical Activity: Sufficiently Active (02/08/2024)   Received from [facility]   Exercise Vital Sign    On average, how many days per week do you engage in moderate to strenuous exercise (like a brisk walk)?: 7 days    On average, how many minutes do you engage in exercise at this level?: 30 min  Stress: No Stress Concern Present (02/08/2024)   Received from [facility] - Occupational Stress Questionnaire    Feeling of Stress : Not at all  Social Connections: Socially Integrated (02/08/2024)   Received from [facility]   Social Network    How would you rate your social network (family, work, friends)?: Good participation with social networks  Intimate Partner Violence: Not At Risk  (02/08/2024)   Received from [facility]   HITS    Over the last 12 months how often did your partner physically hurt you?: Never    Over the last 12 months how often did your partner insult you or talk down to you?: Never    Over the last 12 months how often did your partner threaten you with physical harm?: Never    Over the last 12 months how often did your partner scream or curse at you?: Never     Review of Systems    General:  No chills, fever, night sweats or weight changes.  Cardiovascular:  No chest pain, dyspnea on exertion, edema, orthopnea, palpitations, paroxysmal nocturnal dyspnea. Dermatological: No rash, lesions/masses Respiratory: No cough, dyspnea Urologic: No hematuria, dysuria Abdominal:   No nausea, vomiting, diarrhea, bright red blood per rectum, melena, or hematemesis Neurologic:  No visual changes, wkns, changes in mental status. All other systems reviewed and are otherwise negative except as noted above.  Physical Exam    VS:  There were no vitals taken for this visit. , BMI There is no height or weight on file to calculate BMI. GEN: Well nourished, well developed, in no acute distress. HEENT: normal. Neck: Supple, no JVD, carotid bruits, or masses. Cardiac: RRR, no murmurs, rubs, or gallops. No clubbing, cyanosis, edema.  Radials/DP/PT 2+ and equal bilaterally.  Respiratory:  Respirations regular and unlabored, clear to auscultation bilaterally. GI: Soft, nontender, nondistended, BS + x 4. MS: no deformity or atrophy. Skin: warm and dry, no rash. Neuro:  Strength and sensation are intact. Psych: Normal affect.  Accessory Clinical Findings    Recent Labs: No results found for requested labs within last 365 days.   Recent Lipid Panel    Component Value Date/Time   CHOL 145 08/30/2021 0844  TRIG 153 (H) 08/30/2021 0844   HDL 72 08/30/2021 0844   CHOLHDL 2.0 08/30/2021 0844   CHOLHDL 2.7 CALC 08/10/2007 0840   VLDL 24 08/10/2007 0840   LDLCALC  48 08/30/2021 0844   LDLDIRECT 135.1 04/05/2007 0849    No BP recorded.  {Refresh Note OR Click here to enter BP  :1}***    ECG personally reviewed by me today- ***          Assessment & Plan   1.  ***   Josefa HERO. Jordynne Mccown NP-C     06/16/2024, 2:02 PM [facility] [address] [place], [place] [address] Office [phone]    Notice: This dictation was prepared with Dragon dictation along with smaller phrase technology. Any transcriptional errors that result from this process are unintentional and may not be corrected upon review.   I spent***minutes examining this patient, reviewing medications, and using patient centered shared decision making involving their cardiac care.   I spent  20 minutes reviewing past medical history,  medications, and prior cardiac tests.

## 2024-06-20 ENCOUNTER — Ambulatory Visit: Admitting: General Practice

## 2024-06-20 NOTE — Progress Notes (Signed)
 Remote PPM Transmission

## 2024-06-28 ENCOUNTER — Encounter: Admitting: Cardiovascular Disease

## 2024-07-04 ENCOUNTER — Ambulatory Visit (INDEPENDENT_AMBULATORY_CARE_PROVIDER_SITE_OTHER)

## 2024-07-04 DIAGNOSIS — I495 Sick sinus syndrome: Secondary | ICD-10-CM

## 2024-07-06 ENCOUNTER — Ambulatory Visit: Admitting: Cardiology

## 2024-07-06 ENCOUNTER — Ambulatory Visit: Admitting: Cardiovascular Disease

## 2024-07-06 LAB — CUP PACEART REMOTE DEVICE CHECK
Battery Remaining Longevity: 83 mo
Battery Remaining Percentage: 72 %
Battery Voltage: 3.02 V
Brady Statistic RA Percent Paced: 94 %
Date Time Interrogation Session: 20251020020013
Implantable Lead Connection Status: 753985
Implantable Lead Connection Status: 753985
Implantable Lead Implant Date: 20090617
Implantable Lead Implant Date: 20090617
Implantable Lead Location: 753859
Implantable Lead Location: 753860
Implantable Pulse Generator Implant Date: 20220406
Lead Channel Impedance Value: 400 Ohm
Lead Channel Pacing Threshold Amplitude: 0.75 V
Lead Channel Pacing Threshold Pulse Width: 0.4 ms
Lead Channel Sensing Intrinsic Amplitude: 5 mV
Lead Channel Setting Pacing Amplitude: 2 V
Pulse Gen Model: 2272
Pulse Gen Serial Number: 3911946

## 2024-07-08 NOTE — Progress Notes (Signed)
 Remote PPM Transmission

## 2024-07-11 ENCOUNTER — Ambulatory Visit: Payer: Self-pay | Admitting: Cardiology

## 2024-07-19 NOTE — Progress Notes (Unsigned)
 Cardiology Clinic Note   Patient Name: [name] Date of Encounter: 07/22/2024  Primary Care Provider:  Chinita Hoy CROME, PA-C Primary Cardiologist:  None  Patient Profile    [name] 24 female presents to clinic today for follow-up evaluation of her hyperlipidemia, paroxysmal atrial fibrillation, and hypertrophic cardiomyopathy.  Past Medical History    Past Medical History:  Diagnosis Date   Asymmetric septal hypertrophy    Atrial fibrillation [facility])    with permanent pacemaker   Blepharitis 10/2016   Dr.Groat   Current use of long term anticoagulation    Endometriosis    Glaucoma    H/O mitral valve repair    Headache    HSV (herpes simplex virus) infection    Gluteal & near mouth   IBS (irritable bowel syndrome) 12/2002   Irregular heart beat    Melanoma (HCC) 12/06/2013   melanoma insitu right thigh (tx exc.)   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 (02/23/08  Dr. Dusty)   MYOMECTOMY     median sternotomy   PACEMAKER INSERTION  02/2008   PPM GENERATOR CHANGEOUT N/A 12/19/2020   Procedure: PPM GENERATOR CHANGEOUT;  Surgeon: Fernande Elspeth BROCKS, MD;  Location: [facility] INVASIVE CV LAB;  Service: Cardiovascular;  Laterality: N/A;   TONSILLECTOMY AND ADENOIDECTOMY  1955   TUBAL LIGATION  1983    Allergies  Allergies  Allergen Reactions   Amiodarone Other (See Comments)    Severe rash and upset stomach   Doxycycline     Unknown - pt had other issues going on at the time, unsure if this medication had anything to do with how she felt    History of Present Illness    [name] has a PMH of peripheral arterial disease, hyperlipidemia, paroxysmal atrial fibrillation, and hypertrophic cardiomyopathy.  Echocardiogram 6/24 showed normal EF.  Her PMH also includes history sinus node dysfunction and she is status post PPM in 2009.  She underwent mitral valve repair and septal myomectomy previously as well.  She was previously followed by Dr. Fernande and is now followed by Dr. Cindie.  She was seen in follow-up by Dr. Darron on 10/20/2023.  During that time she was presenting for evaluation of her lower extremity claudication.  It was previously noted that her claudication was worse on her right side.  She was initially seen by Dr. Cloria in 2022.  Her lower extremity arterial Dopplers 9/22 showed ABI of 0.83 on the right and 1.01 on the left.  Her duplex showed severe right mid SFA stenosis with a peak velocity of 451.  She was noted to have left moderate SFA disease.  Her claudication was noted to be mild and medical management was recommended.  Her symptoms improved with an exercise program.  However, at the time of evaluation she did note worsening right calf claudication with activity such as yoga exercises.  Her symptoms did not present with regular daily activities.  She denied pain and lower extremity ulceration.  She noted frustration with her irritable bowel syndrome.  Her lower extremity Doppler study 11/12/2023 showed right ABI 0.66 and left ABI 1.00.  It was recommended that she attend SET program.  If she did not notice improvement it was felt that angiography and related vascularization would be necessary.  She presents to the clinic today for follow-up evaluation and states she has had an issue with her verapamil  at her pharmacy.  She reports that [facility] has been having trouble get extended release verapamil .  She notes that she had improvement in her lower extremity claudication with her exercise regimen.  She is now doing treadmill walking and other type of floor  exercises to simulate the activities that she was doing at rehab.  We reviewed her lower extremity ABIs.  They have remained fairly stable.  She reports compliance with her statin therapy.  Her EKG today shows a paced rhythm 75 bpm.  She does occasionally note increased work of breathing initially when she starts physical activity which dissipates with prolonged activities.  She denies shortness of breath at rest.  I will refill her verapamil , have her maintain her physical activity, and plan follow-up in 6 months.  I will also schedule repeat ABIs and LEAs  To be done in February.  Today she denies chest pain, shortness of breath, lower extremity edema, fatigue, palpitations, melena, hematuria, hemoptysis, diaphoresis, weakness, presyncope, syncope, orthopnea, and PND.    Home Medications    Prior to Admission medications   Medication Sig Start Date End Date Taking? Authorizing Provider  acetaminophen  (TYLENOL ) 650 MG CR tablet Take 1,300 mg by mouth 2 (two) times daily.    [provider]  azelastine (ASTELIN) 0.1 % nasal spray Place 1 spray into the nose 2 (two) times daily. 07/22/23   [provider]  cycloSPORINE  (RESTASIS ) 0.05 % ophthalmic emulsion Place 1 drop into both eyes 2 (two) times daily.    [provider]  diclofenac Sodium (VOLTAREN) 1 % GEL Apply 1 application topically 2 (two) times daily. Knees    [provider]  dicyclomine  (BENTYL ) 20 MG tablet Take 20 mg by mouth 2 (two) times daily.    [provider]  diphenhydrAMINE  (BENADRYL ) 50 MG capsule Take 50 mg by mouth at bedtime.     [provider]  dorzolamide-timolol (COSOPT) 22.3-6.8 MG/ML ophthalmic solution Place 1 drop into both eyes 2 (two) times daily. 05/13/20   [provider]  Eyelid Cleansers (AVENOVA) 0.01 % SOLN Place 1 application into both eyes 2 (two) times daily.    [provider]  fluticasone (FLONASE) 50 MCG/ACT nasal spray Place 1 spray  into both nostrils at bedtime. Patient not taking: Reported on 12/03/2023    [provider]  latanoprost  (XALATAN ) 0.005 % ophthalmic solution Place 1 drop into both eyes at bedtime.    [provider]  levocetirizine (XYZAL) 5 MG tablet Take 5 mg by mouth every evening. 08/20/23   [provider]  loperamide (IMODIUM A-D) 2 MG tablet Take 2 mg by mouth 4 (four) times daily as needed for diarrhea or loose stools. Patient not taking: Reported on 12/03/2023    [provider]  MAGNESIUM  CL-CALCIUM  CARBONATE PO Take 1 tablet by mouth 2 (two) times daily. 500 mg /1000 mg    [provider]  Omega-3 Fatty Acids (FISH OIL) 1000 MG CAPS Take 2 capsules by mouth once a week. Pro Omega D Patient not taking: Reported on 12/03/2023    [provider]  rosuvastatin  (CRESTOR ) 10 MG tablet TAKE 1  TABLET(10 MG) BY MOUTH DAILY 11/25/21   Darron Deatrice LABOR, MD  valACYclovir (VALTREX) 500 MG tablet Take 500 mg by mouth every other day.     [provider]  verapamil  (CALAN -SR) 120 MG CR tablet Take 1 tablet (120 mg total) by mouth 2 (two) times daily. 01/19/24   Fernande Elspeth BROCKS, MD    Family History    Family History  Problem Relation Age of Onset   Breast cancer Mother 67   Heart disease Mother    Breast cancer Maternal Grandmother 46   Coronary artery disease Brother        n his 63s with triple bypass    Coronary artery disease Brother        with 2 vessel coronary disease at age 47   Heart attack Father    She indicated that her mother is deceased. She indicated that the status of her father is unknown. She indicated that one of her three brothers is deceased. She indicated that her maternal grandmother is deceased. She indicated that her maternal grandfather is deceased. She indicated that her paternal grandmother is deceased. She indicated that her paternal grandfather is deceased.  Social History    Social History   Socioeconomic History    Marital status: Married    Spouse name: Not on file   Number of children: 1   Years of education: 16   Highest education level: Master's degree (e.g., MA, MS, MEng, MEd, MSW, MBA)  Occupational History   Occupation: Retired  Tobacco Use   Smoking status: Former   Smokeless tobacco: Never   Tobacco comments:    QUIT 45+ YEARS AGO  Vaping Use   Vaping status: Never Used  Substance and Sexual Activity   Alcohol use: Yes    Comment: 1 shot of bourbon most days, occ wine   Drug use: Yes    Types: Marijuana    Comment: Occasional use   Sexual activity: Yes    Partners: Male    Birth control/protection: Post-menopausal  Other Topics Concern   Not on file  Social History Narrative   Family History:   Last updated: 12/07/2008   Is remarkable for brother in his 81s with triple bypass and another brother with 2 vessel coronary disease at age 83.  Her father died at a later age with a myocardial infarction.       Social History:   Last updated: 12/07/2008   The patient is married and lives here in [place] with her husband.  They have 1 grown child.  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  Her follow is Dr. Rome Li.  She is a nonsmoker, although she did smoke briefly during her youth and quit smoking during her 27s.  She denies excessive alcohol consumption.       Right-haned.   Tea or coffee occasionally.      Social Drivers of Corporate Investment Banker Strain: Low Risk  (02/08/2024)   Received from [facility]   Overall Financial Resource Strain (CARDIA)    Difficulty of Paying Living Expenses: Not hard at all  Food Insecurity: No Food Insecurity (02/08/2024)   Received from [facility]   Hunger Vital Sign    Within the past 12 months, you worried that your food would run out before you got the money to buy more.: Never true    Within the past 12 months, the food you bought just didn't last and you didn't have money  to get more.: Never true   Transportation Needs: No Transportation Needs (02/08/2024)   Received from [facility]   PRAPARE - Transportation    Lack of Transportation (Medical): No    Lack of Transportation (Non-Medical): No  Physical Activity: Sufficiently Active (02/08/2024)   Received from [facility]   Exercise Vital Sign    On average, how many days per week do you engage in moderate to strenuous exercise (like a brisk walk)?: 7 days    On average, how many minutes do you engage in exercise at this level?: 30 min  Stress: No Stress Concern Present (02/08/2024)   Received from [facility] - Occupational Stress Questionnaire    Feeling of Stress : Not at all  Social Connections: Socially Integrated (02/08/2024)   Received from [facility]   Social Network    How would you rate your social network (family, work, friends)?: Good participation with social networks  Intimate Partner Violence: Not At Risk (02/08/2024)   Received from [facility]   HITS    Over the last 12 months how often did your partner physically hurt you?: Never    Over the last 12 months how often did your partner insult you or talk down to you?: Never    Over the last 12 months how often did your partner threaten you with physical harm?: Never    Over the last 12 months how often did your partner scream or curse at you?: Never     Review of Systems    General:  No chills, fever, night sweats or weight changes.  Cardiovascular:  No chest pain, dyspnea on exertion, edema, orthopnea, palpitations, paroxysmal nocturnal dyspnea. Dermatological: No rash, lesions/masses Respiratory: No cough, dyspnea Urologic: No hematuria, dysuria Abdominal:   No nausea, vomiting, diarrhea, bright red blood per rectum, melena, or hematemesis Neurologic:  No visual changes, wkns, changes in mental status. All other systems reviewed and are otherwise negative except as noted above.  Physical Exam    VS:  BP  (!) 126/56   Pulse 75   Ht 4' 11 (1.499 m)   Wt 117 lb (53.1 kg)   SpO2 95%   BMI 23.63 kg/m  , BMI Body mass index is 23.63 kg/m. GEN: Well nourished, well developed, in no acute distress. HEENT: normal. Neck: Supple, no JVD, carotid bruits, or masses. Cardiac: RRR, no murmurs, rubs, or gallops. No clubbing, cyanosis, edema.  Radials/DP/PT 2+ and equal bilaterally.  Respiratory:  Respirations regular and unlabored, clear to auscultation bilaterally. GI: Soft, nontender, nondistended, BS + x 4. MS: no deformity or atrophy. Skin: warm and dry, no rash. Neuro:  Strength and sensation are intact. Psych: Normal affect.  Accessory Clinical Findings    Recent Labs: No results found for requested labs within last 365 days.   Recent Lipid Panel    Component Value Date/Time   CHOL 145 08/30/2021 0844   TRIG 153 (H) 08/30/2021 0844   HDL 72 08/30/2021 0844   CHOLHDL 2.0 08/30/2021 0844   CHOLHDL 2.7 CALC 08/10/2007 0840   VLDL 24 08/10/2007 0840   LDLCALC 48 08/30/2021 0844   LDLDIRECT 135.1 04/05/2007 0849         ECG personally reviewed by me today- EKG Interpretation Date/Time:  Friday July 22 2024 11:15:48 EST Ventricular Rate:  75 PR Interval:  218 QRS Duration:  132 QT Interval:  446 QTC Calculation: 498 R Axis:   -1  Text Interpretation: Atrial-paced  rhythm with prolonged AV conduction Left bundle branch block Confirmed by Emelia Hazy [phone]) on 07/22/2024 11:22:34 AM   Echocardiogram 03/03/2023  IMPRESSIONS     1. Left ventricular ejection fraction, by estimation, is 60 to 65%. The  left ventricle has normal function. There is moderate asymmetric left  ventricular hypertrophy of the basal-septal segment. Left ventricular  diastolic parameters are indeterminate.  No LVOT obstruction   2. Right ventricular systolic function is normal. The right ventricular  size is normal. There is normal pulmonary artery systolic pressure.   3. The mitral valve is  degenerative. Mild mitral valve regurgitation.  Moderate mitral annular calcification.   4. Tricuspid valve regurgitation is mild to moderate.   5. The aortic valve was not well visualized. Aortic valve regurgitation  is moderate. No aortic stenosis is present.   FINDINGS   Left Ventricle: Left ventricular ejection fraction, by estimation, is 60  to 65%. The left ventricle has normal function. The left ventricle has no  regional wall motion abnormalities. The left ventricular internal cavity  size was normal in size. There is   moderate asymmetric left ventricular hypertrophy of the basal-septal  segment. Left ventricular diastolic parameters are indeterminate.   Right Ventricle: The right ventricular size is normal. No increase in  right ventricular wall thickness. Right ventricular systolic function is  normal. There is normal pulmonary artery systolic pressure. The tricuspid  regurgitant velocity is 2.47 m/s, and   with an assumed right atrial pressure of 8 mmHg, the estimated right  ventricular systolic pressure is 32.4 mmHg.   Left Atrium: Left atrial size was normal in size.   Right Atrium: Right atrial size was normal in size.   Pericardium: There is no evidence of pericardial effusion.   Mitral Valve: The mitral valve is degenerative in appearance. Moderate  mitral annular calcification. Mild mitral valve regurgitation.   Tricuspid Valve: The tricuspid valve is normal in structure. Tricuspid  valve regurgitation is mild to moderate.   Aortic Valve: The aortic valve was not well visualized. Aortic valve  regurgitation is moderate. Aortic regurgitation PHT measures 324 msec. No  aortic stenosis is present.   Pulmonic Valve: The pulmonic valve was not well visualized. Pulmonic valve  regurgitation is trivial.   Aorta: The aortic root is normal in size and structure.   IAS/Shunts: The interatrial septum was not well visualized.       Assessment & Plan   1.   Paroxysmal atrial fibrillation-heart rate today 75.  Denies recent episodes of accelerated or irregular heartbeat.  EKG shows a paced rhythm with LBBB 75 bpm Avoid triggers caffeine , chocolate, EtOH, dehydration excetra. Continue magnesium , rosuvastatin   Cardiomyopathy-denies increased DOE or activity intolerance.  Weight 117 pounds.  Euvolemic.  Seen in follow-up by Dr. Fernande 09/15/2023.  During that time she denied chest pain.  Device interrogation 07/04/2024 showed a paced rhythm with stable capture and sensing.  Lead parameters were stable.  Device programming was appropriate.  Remote monitoring was continued.  (Dr. Cindie) Heart healthy low-sodium diet Increase physical activity as tolerated Continue rosuvastatin , omega-3 fatty acids, verapamil   Peripheral arterial disease-Notes occasional brief episodes of lower extremity claudication.  ABIs 2/26 showed right ABI 0.66 and left ABI 1.0.  She was instructed to attend structured exercise program. Continue physical activity Continue rosuvastatin , verapamil , omega-3 fatty acids, magnesium   Hyperlipidemia-LDL 48 on 12/22 . High-fiber diet Maintain physical activity/increase as tolerated Continue current medical therapy  Disposition: Follow-up with Dr. Darron or me in 6 months.  Josefa HERO. Elloise Roark NP-C     07/22/2024, 12:00 PM [facility] [address] [place], [place] [address] Office [phone]    Notice: This dictation was prepared with Dragon dictation along with smaller phrase technology. Any transcriptional errors that result from this process are unintentional and may not be corrected upon review.   I spent 14 minutes examining this patient, reviewing medications, and using patient centered shared decision making involving their cardiac care.   I spent  20 minutes reviewing past medical history,  medications, and prior cardiac tests.

## 2024-07-22 ENCOUNTER — Ambulatory Visit: Attending: General Practice | Admitting: General Practice

## 2024-07-22 ENCOUNTER — Encounter: Payer: Self-pay | Admitting: General Practice

## 2024-07-22 VITALS — BP 126/56 | HR 75 | Ht 59.0 in | Wt 117.0 lb

## 2024-07-22 DIAGNOSIS — E782 Mixed hyperlipidemia: Secondary | ICD-10-CM

## 2024-07-22 DIAGNOSIS — I480 Paroxysmal atrial fibrillation: Secondary | ICD-10-CM | POA: Diagnosis not present

## 2024-07-22 DIAGNOSIS — E785 Hyperlipidemia, unspecified: Secondary | ICD-10-CM | POA: Diagnosis not present

## 2024-07-22 DIAGNOSIS — I739 Peripheral vascular disease, unspecified: Secondary | ICD-10-CM | POA: Diagnosis not present

## 2024-07-22 DIAGNOSIS — I421 Obstructive hypertrophic cardiomyopathy: Secondary | ICD-10-CM

## 2024-07-22 MED ORDER — VERAPAMIL HCL ER 120 MG PO TBCR: 120.0000 mg | EXTENDED_RELEASE_TABLET | Freq: Two times a day (BID) | ORAL | 2 refills | Status: AC

## 2024-07-22 NOTE — Patient Instructions (Addendum)
 Medication Instructions:  Your physician recommends that you continue on your current medications as directed. Please refer to the Current Medication list given to you today. *If you need a refill on your cardiac medications before your next appointment, please call your pharmacy*  Lab Work: None ordered If you have labs (blood work) drawn today and your tests are completely normal, you will receive your results only by: MyChart Message (if you have MyChart) OR A paper copy in the mail If you have any lab test that is abnormal or we need to change your treatment, we will call you to review the results.  Testing/Procedures: SCHEDULE IN FEBRUARY 2026. Your physician has requested that you have a lower extremity arterial exercise duplex. During this test, exercise and ultrasound are used to evaluate arterial blood flow in the legs. Allow one hour for this exam. There are no restrictions or special instructions.  Your physician has requested that you have an ankle brachial index (ABI). During this test an ultrasound and blood pressure cuff are used to evaluate the arteries that supply the arms and legs with blood. Allow thirty minutes for this exam. There are no restrictions or special instructions.  Please note: We ask at that you not bring children with you during ultrasound (echo/ vascular) testing. Due to room size and safety concerns, children are not allowed in the ultrasound rooms during exams. Our front office staff cannot provide observation of children in our lobby area while testing is being conducted. An adult accompanying a patient to their appointment will only be allowed in the ultrasound room at the discretion of the ultrasound technician under special circumstances. We apologize for any inconvenience.  Follow-Up: At [facility], you and your health needs are our priority.  As part of our continuing mission to provide you with exceptional heart care, our providers are all  part of one team.  This team includes your primary Cardiologist (physician) and Advanced Practice Providers or APPs (Physician Assistants and Nurse Practitioners) who all work together to provide you with the care you need, when you need it.  Your next appointment:   6 month(s)  Provider:   Deatrice Cage, MD or Josefa Beauvais, NP   We recommend signing up for the patient portal called MyChart.  Sign up information is provided on this After Visit Summary.  MyChart is used to connect with patients for Virtual Visits (Telemedicine).  Patients are able to view lab/test results, encounter notes, upcoming appointments, etc.  Non-urgent messages can be sent to your provider as well.   To learn more about what you can do with MyChart, go to [email or web address].   Other Instructions

## 2024-07-29 ENCOUNTER — Ambulatory Visit: Attending: Cardiology | Admitting: Cardiology

## 2024-07-29 VITALS — BP 138/66 | HR 90 | Ht 59.0 in | Wt 119.7 lb

## 2024-07-29 DIAGNOSIS — I480 Paroxysmal atrial fibrillation: Secondary | ICD-10-CM | POA: Diagnosis not present

## 2024-07-29 DIAGNOSIS — Z950 Presence of cardiac pacemaker: Secondary | ICD-10-CM

## 2024-07-29 DIAGNOSIS — I495 Sick sinus syndrome: Secondary | ICD-10-CM

## 2024-07-29 DIAGNOSIS — I422 Other hypertrophic cardiomyopathy: Secondary | ICD-10-CM | POA: Diagnosis not present

## 2024-07-29 DIAGNOSIS — D6869 Other thrombophilia: Secondary | ICD-10-CM

## 2024-07-29 DIAGNOSIS — R0609 Other forms of dyspnea: Secondary | ICD-10-CM

## 2024-07-29 LAB — CUP PACEART INCLINIC DEVICE CHECK
Battery Remaining Longevity: 79 mo
Battery Voltage: 3.01 V
Brady Statistic RA Percent Paced: 94 %
Brady Statistic RV Percent Paced: 0 %
Date Time Interrogation Session: 20251114141440
Implantable Lead Connection Status: 753985
Implantable Lead Connection Status: 753985
Implantable Lead Implant Date: 20090617
Implantable Lead Implant Date: 20090617
Implantable Lead Location: 753859
Implantable Lead Location: 753860
Implantable Pulse Generator Implant Date: 20220406
Lead Channel Impedance Value: 412.5 Ohm
Lead Channel Impedance Value: 475 Ohm
Lead Channel Pacing Threshold Amplitude: 0.75 V
Lead Channel Pacing Threshold Amplitude: 0.75 V
Lead Channel Pacing Threshold Pulse Width: 0.4 ms
Lead Channel Pacing Threshold Pulse Width: 0.4 ms
Lead Channel Sensing Intrinsic Amplitude: 5 mV
Lead Channel Setting Pacing Amplitude: 2 V
Pulse Gen Model: 2272
Pulse Gen Serial Number: 3911946

## 2024-07-29 NOTE — Progress Notes (Signed)
 Electrophysiology Office Note:   Date:  07/29/2024  ID:  [name], DOB 03-19-1941, MRN [id number]  Primary Cardiologist: None Electrophysiologist: Fonda Kitty, MD      History of Present Illness:   [name] is a 83 y.o. female with h/o sinus node dysfunction s/p pacemaker, hyperlipidemia, paroxysmal atrial fibrillation, and hypertrophic cardiomyopathy s/p septal myectomy and MVr who is being seen today for pacemaker follow up.  Discussed the use of AI scribe software for clinical note transcription with the patient, who gave verbal consent to proceed.  History of Present Illness [name] is an 83 year old female with hypertrophic cardiomyopathy and atrial fibrillation who presents for a routine follow-up regarding her pacemaker and symptoms of shortness of breath. She was reassigned to Dr. Kitty following the retirement of Dr. Fernande, who previously managed her pacemake.  She experiences ongoing issues with the rate response of her pacemaker, particularly shortness of breath when initiating movement, such as getting out of the car or walking short distances. The sensation sometimes lasts longer than expected, and she is unsure if it is related to trouble breathing or fatigue.  Her medical history includes a myectomy and mitral valve repair. She is uncertain when her last heart ultrasound was performed but recalls mild mitral regurgitation was noted at that time.  She has a history of atrial fibrillation and is currently taking Eliquis  and verapamil . She obtains Eliquis  through a [employer].  She also has peripheral arterial disease and engages in daily aerobics for about half an hour, although she finds herself limited.    Review of systems complete and found to be negative unless listed in HPI.   EP Information / Studies Reviewed:    EKG is not ordered today. EKG from 07/22/24 reviewed which showed AP - VS rhythm, PR and QRS .      Echo  03/03/23:  1. Left ventricular ejection fraction, by estimation, is 60 to 65%. The  left ventricle has normal function. There is moderate asymmetric left  ventricular hypertrophy of the basal-septal segment. Left ventricular  diastolic parameters are indeterminate.  No LVOT obstruction   2. Right ventricular systolic function is normal. The right ventricular  size is normal. There is normal pulmonary artery systolic pressure.   3. The mitral valve is degenerative. Mild mitral valve regurgitation.  Moderate mitral annular calcification.   4. Tricuspid valve regurgitation is mild to moderate.   5. The aortic valve was not well visualized. Aortic valve regurgitation  is moderate. No aortic stenosis is present.        Physical Exam:   VS:  There were no vitals taken for this visit.   Wt Readings from Last 3 Encounters:  07/22/24 117 lb (53.1 kg)  02/19/24 119 lb 4.3 oz (54.1 kg)  12/03/23 122 lb 5.7 oz (55.5 kg)     General: Well developed, in no acute distress.  Neck: No JVD.  Cardiac: Normal rate, regular rhythm.  Well-healed left chest pacer pocket. Resp: Normal work of breathing.  Ext: No edema.  Neuro: No gross focal deficits.  Psych: Normal affect.   ASSESSMENT AND PLAN:    #Sinus node dysfunction s/p single chamber atrial pacemaker - In-clinic device interrogation was performed today.  Appropriate device function and stable lead parameters. - Continue remote monitoring.  #Paroxysmal atrial fibrillation  #Hypercoagulable state due to AF - Low burden by device.  Continue monitoring with device. - Continue Eliquis .  She obtains this to be a  [facility]. - Continue verapamil .  #Hypertrophic cardiomyopathy s/p myectomy and Mvr - Continue verapamil . -Repeat echocardiogram given her complaint of dyspnea with exertion.   Follow up with Dr. Kennyth in 12 months  Signed, Fonda Kennyth, MD

## 2024-07-29 NOTE — Patient Instructions (Signed)
 Medication Instructions:  Your physician recommends that you continue on your current medications as directed. Please refer to the Current Medication list given to you today.  *If you need a refill on your cardiac medications before your next appointment, please call your pharmacy*  Testing/Procedures: Echocardiogram  Your physician has requested that you have an echocardiogram. Echocardiography is a painless test that uses sound waves to create images of your heart. It provides your doctor with information about the size and shape of your heart and how well your heart's chambers and valves are working. This procedure takes approximately one hour. There are no restrictions for this procedure. Please do NOT wear cologne, perfume, aftershave, or lotions (deodorant is allowed). Please arrive 15 minutes prior to your appointment time.  Please note: We ask at that you not bring children with you during ultrasound (echo/ vascular) testing. Due to room size and safety concerns, children are not allowed in the ultrasound rooms during exams. Our front office staff cannot provide observation of children in our lobby area while testing is being conducted. An adult accompanying a patient to their appointment will only be allowed in the ultrasound room at the discretion of the ultrasound technician under special circumstances. We apologize for any inconvenience.  Follow-Up: At [facility], you and your health needs are our priority.  As part of our continuing mission to provide you with exceptional heart care, our providers are all part of one team.  This team includes your primary Cardiologist (physician) and Advanced Practice Providers or APPs (Physician Assistants and Nurse Practitioners) who all work together to provide you with the care you need, when you need it.  Your next appointment:   1 year  Provider:   You will see one of the following Advanced Practice Providers on your designated Care  Team:   Charlies Arthur, [place] Jodie Passey, PA-C Suzann Riddle, NP Daphne Barrack, NP Artist Pouch, PA-C

## 2024-09-20 ENCOUNTER — Ambulatory Visit (HOSPITAL_COMMUNITY)
Admission: RE | Admit: 2024-09-20 | Discharge: 2024-09-20 | Disposition: A | Discharge status: Home / Self Care | Source: Ambulatory Visit | Attending: Cardiology | Admitting: Cardiology

## 2024-09-20 DIAGNOSIS — Z950 Presence of cardiac pacemaker: Secondary | ICD-10-CM | POA: Diagnosis present

## 2024-09-20 DIAGNOSIS — I480 Paroxysmal atrial fibrillation: Secondary | ICD-10-CM | POA: Insufficient documentation

## 2024-09-20 DIAGNOSIS — I495 Sick sinus syndrome: Secondary | ICD-10-CM | POA: Insufficient documentation

## 2024-09-20 DIAGNOSIS — I422 Other hypertrophic cardiomyopathy: Secondary | ICD-10-CM | POA: Insufficient documentation

## 2024-09-20 DIAGNOSIS — D6869 Other thrombophilia: Secondary | ICD-10-CM | POA: Insufficient documentation

## 2024-09-20 LAB — ECHOCARDIOGRAM COMPLETE
Area-P 1/2: 3.5 cm2
MV VTI: 2.65 cm2
P 1/2 time: 268 ms
S' Lateral: 2.4 cm

## 2024-09-25 ENCOUNTER — Encounter: Payer: Self-pay | Admitting: Cardiology

## 2024-09-25 ENCOUNTER — Ambulatory Visit: Payer: Self-pay | Admitting: Cardiology

## 2024-10-03 ENCOUNTER — Ambulatory Visit

## 2024-10-03 DIAGNOSIS — I495 Sick sinus syndrome: Secondary | ICD-10-CM

## 2024-10-04 LAB — CUP PACEART REMOTE DEVICE CHECK
Battery Remaining Longevity: 81 mo
Battery Remaining Percentage: 69 %
Battery Voltage: 3.01 V
Brady Statistic RA Percent Paced: 91 %
Date Time Interrogation Session: 20260119020014
Implantable Lead Connection Status: 753985
Implantable Lead Connection Status: 753985
Implantable Lead Implant Date: 20090617
Implantable Lead Implant Date: 20090617
Implantable Lead Location: 753859
Implantable Lead Location: 753860
Implantable Pulse Generator Implant Date: 20220406
Lead Channel Impedance Value: 400 Ohm
Lead Channel Pacing Threshold Amplitude: 0.75 V
Lead Channel Pacing Threshold Pulse Width: 0.4 ms
Lead Channel Sensing Intrinsic Amplitude: 4.9 mV
Lead Channel Setting Pacing Amplitude: 2 V
Pulse Gen Model: 2272
Pulse Gen Serial Number: 3911946

## 2024-10-07 NOTE — Progress Notes (Signed)
 Remote PPM Transmission

## 2024-10-09 ENCOUNTER — Ambulatory Visit: Payer: Self-pay | Admitting: Cardiology

## 2024-10-25 ENCOUNTER — Ambulatory Visit (HOSPITAL_COMMUNITY)

## 2024-10-25 ENCOUNTER — Ambulatory Visit (HOSPITAL_COMMUNITY): Admission: RE | Admit: 2024-10-25 | Source: Ambulatory Visit

## 2025-01-02 ENCOUNTER — Encounter

## 2025-04-03 ENCOUNTER — Encounter

## 2025-07-03 ENCOUNTER — Encounter

## 2025-10-02 ENCOUNTER — Encounter

## 2026-01-01 ENCOUNTER — Encounter
